# Patient Record
Sex: Female | Born: 1935 | Race: White | Hispanic: No | Smoking: Never smoker
Health system: Southern US, Community
[De-identification: ages and names within clinical notes are randomized; demographics above are authoritative.]

## PROBLEM LIST (undated history)

## (undated) DIAGNOSIS — C801 Malignant (primary) neoplasm, unspecified: Secondary | ICD-10-CM

## (undated) DIAGNOSIS — E538 Deficiency of other specified B group vitamins: Secondary | ICD-10-CM

## (undated) DIAGNOSIS — Z9181 History of falling: Secondary | ICD-10-CM

## (undated) DIAGNOSIS — N83201 Unspecified ovarian cyst, right side: Secondary | ICD-10-CM

## (undated) DIAGNOSIS — K219 Gastro-esophageal reflux disease without esophagitis: Secondary | ICD-10-CM

## (undated) DIAGNOSIS — R609 Edema, unspecified: Secondary | ICD-10-CM

## (undated) DIAGNOSIS — M810 Age-related osteoporosis without current pathological fracture: Principal | ICD-10-CM

## (undated) DIAGNOSIS — D61818 Other pancytopenia: Secondary | ICD-10-CM

## (undated) DIAGNOSIS — N83202 Unspecified ovarian cyst, left side: Secondary | ICD-10-CM

## (undated) DIAGNOSIS — M1990 Unspecified osteoarthritis, unspecified site: Secondary | ICD-10-CM

## (undated) DIAGNOSIS — E079 Disorder of thyroid, unspecified: Secondary | ICD-10-CM

## (undated) DIAGNOSIS — C50919 Malignant neoplasm of unspecified site of unspecified female breast: Principal | ICD-10-CM

## (undated) DIAGNOSIS — M549 Dorsalgia, unspecified: Secondary | ICD-10-CM

## (undated) DIAGNOSIS — I10 Essential (primary) hypertension: Secondary | ICD-10-CM

## (undated) HISTORY — DX: Deficiency of other specified B group vitamins: E53.8

## (undated) HISTORY — PX: TOTAL KNEE REVISION: SHX996

## (undated) HISTORY — PX: HERNIA REPAIR: SHX51

## (undated) HISTORY — DX: History of falling: Z91.81

## (undated) HISTORY — PX: BREAST SURGERY: SHX581

## (undated) HISTORY — DX: Unspecified ovarian cyst, right side: N83.201

## (undated) HISTORY — PX: TONSILLECTOMY: SUR1361

## (undated) HISTORY — DX: Unspecified ovarian cyst, left side: N83.202

## (undated) HISTORY — PX: CHOLECYSTECTOMY: SHX55

## (undated) HISTORY — DX: Age-related osteoporosis without current pathological fracture: M81.0

## (undated) HISTORY — DX: Malignant neoplasm of unspecified site of unspecified female breast: C50.919

## (undated) HISTORY — DX: Other pancytopenia: D61.818

## (undated) HISTORY — PX: APPENDECTOMY: SHX54

## (undated) HISTORY — PX: HIP FRACTURE SURGERY: SHX118

---

## 1998-01-11 ENCOUNTER — Other Ambulatory Visit: Admission: RE | Admit: 1998-01-11 | Discharge: 1998-01-11 | Payer: Self-pay | Admitting: Gastroenterology

## 2000-02-12 ENCOUNTER — Encounter: Admission: RE | Admit: 2000-02-12 | Discharge: 2000-02-12 | Payer: Self-pay | Admitting: Internal Medicine

## 2000-02-12 ENCOUNTER — Encounter: Payer: Self-pay | Admitting: Internal Medicine

## 2000-07-30 ENCOUNTER — Encounter (INDEPENDENT_AMBULATORY_CARE_PROVIDER_SITE_OTHER): Payer: Self-pay | Admitting: Specialist

## 2000-07-30 ENCOUNTER — Other Ambulatory Visit: Admission: RE | Admit: 2000-07-30 | Discharge: 2000-07-30 | Payer: Self-pay | Admitting: Gastroenterology

## 2000-09-01 ENCOUNTER — Other Ambulatory Visit: Admission: RE | Admit: 2000-09-01 | Discharge: 2000-09-01 | Payer: Self-pay | Admitting: Internal Medicine

## 2001-03-31 ENCOUNTER — Encounter: Payer: Self-pay | Admitting: Internal Medicine

## 2001-03-31 ENCOUNTER — Encounter: Admission: RE | Admit: 2001-03-31 | Discharge: 2001-03-31 | Payer: Self-pay | Admitting: Internal Medicine

## 2001-04-12 ENCOUNTER — Emergency Department (HOSPITAL_COMMUNITY): Admission: EM | Admit: 2001-04-12 | Discharge: 2001-04-12 | Payer: Self-pay | Admitting: Emergency Medicine

## 2001-04-12 ENCOUNTER — Encounter: Payer: Self-pay | Admitting: Emergency Medicine

## 2001-04-16 ENCOUNTER — Inpatient Hospital Stay (HOSPITAL_COMMUNITY): Admission: RE | Admit: 2001-04-16 | Discharge: 2001-04-18 | Payer: Self-pay | Admitting: Orthopaedic Surgery

## 2001-04-16 ENCOUNTER — Encounter: Payer: Self-pay | Admitting: Orthopaedic Surgery

## 2001-06-15 ENCOUNTER — Ambulatory Visit (HOSPITAL_COMMUNITY): Admission: RE | Admit: 2001-06-15 | Discharge: 2001-06-15 | Payer: Self-pay | Admitting: Orthopaedic Surgery

## 2001-06-15 ENCOUNTER — Encounter: Payer: Self-pay | Admitting: Orthopaedic Surgery

## 2001-09-01 ENCOUNTER — Other Ambulatory Visit: Admission: RE | Admit: 2001-09-01 | Discharge: 2001-09-01 | Payer: Self-pay | Admitting: Internal Medicine

## 2002-04-06 ENCOUNTER — Ambulatory Visit (HOSPITAL_COMMUNITY): Admission: RE | Admit: 2002-04-06 | Discharge: 2002-04-06 | Payer: Self-pay | Admitting: Ophthalmology

## 2002-04-26 ENCOUNTER — Encounter: Payer: Self-pay | Admitting: Internal Medicine

## 2002-04-26 ENCOUNTER — Encounter: Admission: RE | Admit: 2002-04-26 | Discharge: 2002-04-26 | Payer: Self-pay | Admitting: Internal Medicine

## 2003-02-28 ENCOUNTER — Other Ambulatory Visit: Admission: RE | Admit: 2003-02-28 | Discharge: 2003-02-28 | Payer: Self-pay | Admitting: Obstetrics & Gynecology

## 2003-06-07 ENCOUNTER — Encounter: Admission: RE | Admit: 2003-06-07 | Discharge: 2003-06-07 | Payer: Self-pay | Admitting: Internal Medicine

## 2003-06-16 ENCOUNTER — Emergency Department (HOSPITAL_COMMUNITY): Admission: EM | Admit: 2003-06-16 | Discharge: 2003-06-16 | Payer: Self-pay | Admitting: Emergency Medicine

## 2003-08-08 ENCOUNTER — Ambulatory Visit (HOSPITAL_COMMUNITY): Admission: RE | Admit: 2003-08-08 | Discharge: 2003-08-08 | Payer: Self-pay | Admitting: *Deleted

## 2003-11-02 ENCOUNTER — Ambulatory Visit (HOSPITAL_COMMUNITY): Admission: RE | Admit: 2003-11-02 | Discharge: 2003-11-02 | Payer: Self-pay | Admitting: Gastroenterology

## 2004-06-05 ENCOUNTER — Ambulatory Visit: Payer: Self-pay | Admitting: Internal Medicine

## 2004-06-11 ENCOUNTER — Ambulatory Visit (HOSPITAL_COMMUNITY): Admission: RE | Admit: 2004-06-11 | Discharge: 2004-06-11 | Payer: Self-pay | Admitting: Orthopaedic Surgery

## 2004-06-18 ENCOUNTER — Ambulatory Visit (HOSPITAL_COMMUNITY): Admission: RE | Admit: 2004-06-18 | Discharge: 2004-06-18 | Payer: Self-pay | Admitting: Orthopaedic Surgery

## 2004-07-10 ENCOUNTER — Encounter: Admission: RE | Admit: 2004-07-10 | Discharge: 2004-07-10 | Payer: Self-pay | Admitting: Internal Medicine

## 2004-07-13 ENCOUNTER — Ambulatory Visit (HOSPITAL_COMMUNITY): Admission: RE | Admit: 2004-07-13 | Discharge: 2004-07-13 | Payer: Self-pay | Admitting: Internal Medicine

## 2004-09-12 ENCOUNTER — Ambulatory Visit (HOSPITAL_COMMUNITY): Admission: RE | Admit: 2004-09-12 | Discharge: 2004-09-12 | Payer: Self-pay | Admitting: Internal Medicine

## 2004-11-05 ENCOUNTER — Ambulatory Visit: Payer: Self-pay | Admitting: Internal Medicine

## 2004-12-04 ENCOUNTER — Ambulatory Visit (HOSPITAL_COMMUNITY): Admission: RE | Admit: 2004-12-04 | Discharge: 2004-12-04 | Payer: Self-pay | Admitting: Obstetrics & Gynecology

## 2005-07-12 ENCOUNTER — Ambulatory Visit: Payer: Self-pay | Admitting: Internal Medicine

## 2005-08-09 ENCOUNTER — Ambulatory Visit (HOSPITAL_COMMUNITY): Admission: RE | Admit: 2005-08-09 | Discharge: 2005-08-09 | Payer: Self-pay | Admitting: Internal Medicine

## 2005-08-18 ENCOUNTER — Emergency Department (HOSPITAL_COMMUNITY): Admission: EM | Admit: 2005-08-18 | Discharge: 2005-08-19 | Payer: Self-pay | Admitting: Emergency Medicine

## 2005-08-27 ENCOUNTER — Ambulatory Visit (HOSPITAL_COMMUNITY): Admission: RE | Admit: 2005-08-27 | Discharge: 2005-08-27 | Payer: Self-pay | Admitting: Internal Medicine

## 2005-10-04 ENCOUNTER — Ambulatory Visit: Payer: Self-pay | Admitting: Gastroenterology

## 2005-10-07 ENCOUNTER — Ambulatory Visit (HOSPITAL_COMMUNITY): Admission: RE | Admit: 2005-10-07 | Discharge: 2005-10-07 | Payer: Self-pay | Admitting: Gastroenterology

## 2005-11-05 ENCOUNTER — Ambulatory Visit: Payer: Self-pay | Admitting: Gastroenterology

## 2005-11-11 ENCOUNTER — Ambulatory Visit: Payer: Self-pay | Admitting: Gastroenterology

## 2006-01-29 ENCOUNTER — Ambulatory Visit (HOSPITAL_COMMUNITY): Admission: RE | Admit: 2006-01-29 | Discharge: 2006-01-29 | Payer: Self-pay | Admitting: Internal Medicine

## 2006-03-18 ENCOUNTER — Ambulatory Visit: Payer: Self-pay | Admitting: Gastroenterology

## 2006-03-19 ENCOUNTER — Encounter (HOSPITAL_COMMUNITY): Admission: RE | Admit: 2006-03-19 | Discharge: 2006-04-18 | Payer: Self-pay | Admitting: Orthopaedic Surgery

## 2006-03-28 ENCOUNTER — Ambulatory Visit (HOSPITAL_COMMUNITY): Admission: RE | Admit: 2006-03-28 | Discharge: 2006-03-28 | Payer: Self-pay | Admitting: Family Medicine

## 2006-04-18 ENCOUNTER — Ambulatory Visit: Payer: Self-pay | Admitting: Gastroenterology

## 2006-04-18 LAB — CONVERTED CEMR LAB
Fecal Occult Blood: NEGATIVE
OCCULT 1: NEGATIVE
OCCULT 2: NEGATIVE
OCCULT 3: NEGATIVE
OCCULT 4: NEGATIVE
OCCULT 5: NEGATIVE

## 2006-05-23 ENCOUNTER — Ambulatory Visit (HOSPITAL_COMMUNITY): Admission: RE | Admit: 2006-05-23 | Discharge: 2006-05-23 | Payer: Self-pay | Admitting: Orthopaedic Surgery

## 2006-06-03 ENCOUNTER — Ambulatory Visit (HOSPITAL_COMMUNITY): Admission: RE | Admit: 2006-06-03 | Discharge: 2006-06-03 | Payer: Self-pay | Admitting: Orthopaedic Surgery

## 2006-06-03 ENCOUNTER — Encounter (INDEPENDENT_AMBULATORY_CARE_PROVIDER_SITE_OTHER): Payer: Self-pay | Admitting: *Deleted

## 2006-06-09 ENCOUNTER — Encounter (HOSPITAL_COMMUNITY): Admission: RE | Admit: 2006-06-09 | Discharge: 2006-07-09 | Payer: Self-pay | Admitting: Orthopaedic Surgery

## 2006-07-14 ENCOUNTER — Encounter (HOSPITAL_COMMUNITY): Admission: RE | Admit: 2006-07-14 | Discharge: 2006-08-13 | Payer: Self-pay | Admitting: Orthopaedic Surgery

## 2006-08-27 ENCOUNTER — Ambulatory Visit (HOSPITAL_COMMUNITY): Admission: RE | Admit: 2006-08-27 | Discharge: 2006-08-27 | Payer: Self-pay | Admitting: Internal Medicine

## 2006-09-29 ENCOUNTER — Ambulatory Visit (HOSPITAL_COMMUNITY): Admission: RE | Admit: 2006-09-29 | Discharge: 2006-09-29 | Payer: Self-pay | Admitting: Internal Medicine

## 2006-10-22 ENCOUNTER — Ambulatory Visit: Payer: Self-pay | Admitting: Internal Medicine

## 2006-10-22 ENCOUNTER — Ambulatory Visit: Payer: Self-pay | Admitting: Gastroenterology

## 2006-10-22 LAB — CONVERTED CEMR LAB
AST: 24 units/L (ref 0–37)
BUN: 14 mg/dL (ref 6–23)
Basophils Absolute: 0.1 10*3/uL (ref 0.0–0.1)
Basophils Relative: 0.7 % (ref 0.0–1.0)
Bilirubin, Direct: 0.3 mg/dL (ref 0.0–0.3)
CO2: 31 meq/L (ref 19–32)
Calcium: 9.8 mg/dL (ref 8.4–10.5)
Eosinophils Absolute: 0.2 10*3/uL (ref 0.0–0.6)
Eosinophils Relative: 2.1 % (ref 0.0–5.0)
GFR calc Af Amer: 63 mL/min
Glucose, Bld: 98 mg/dL (ref 70–99)
HCT: 37.4 % (ref 36.0–46.0)
Hemoglobin: 12.3 g/dL (ref 12.0–15.0)
Lymphocytes Relative: 26.9 % (ref 12.0–46.0)
MCV: 85.1 fL (ref 78.0–100.0)
Monocytes Absolute: 0.5 10*3/uL (ref 0.2–0.7)
Monocytes Relative: 6.5 % (ref 3.0–11.0)
Neutro Abs: 4.6 10*3/uL (ref 1.4–7.7)
Neutrophils Relative %: 63.8 % (ref 43.0–77.0)
Platelets: 141 10*3/uL — ABNORMAL LOW (ref 150–400)
RBC: 4.39 M/uL (ref 3.87–5.11)
Sodium: 141 meq/L (ref 135–145)
TSH: 0.33 microintl units/mL — ABNORMAL LOW (ref 0.35–5.50)
Total Protein: 6.2 g/dL (ref 6.0–8.3)

## 2006-11-03 ENCOUNTER — Ambulatory Visit: Payer: Self-pay | Admitting: Gastroenterology

## 2007-03-10 DIAGNOSIS — E039 Hypothyroidism, unspecified: Secondary | ICD-10-CM

## 2007-03-10 DIAGNOSIS — K219 Gastro-esophageal reflux disease without esophagitis: Secondary | ICD-10-CM

## 2007-03-10 DIAGNOSIS — I10 Essential (primary) hypertension: Secondary | ICD-10-CM | POA: Insufficient documentation

## 2007-07-06 ENCOUNTER — Other Ambulatory Visit: Admission: RE | Admit: 2007-07-06 | Discharge: 2007-07-06 | Payer: Self-pay | Admitting: Obstetrics and Gynecology

## 2007-09-22 ENCOUNTER — Ambulatory Visit (HOSPITAL_COMMUNITY): Admission: RE | Admit: 2007-09-22 | Discharge: 2007-09-22 | Payer: Self-pay | Admitting: Ophthalmology

## 2007-10-28 ENCOUNTER — Ambulatory Visit (HOSPITAL_COMMUNITY): Admission: RE | Admit: 2007-10-28 | Discharge: 2007-10-28 | Payer: Self-pay | Admitting: Internal Medicine

## 2008-04-12 ENCOUNTER — Ambulatory Visit (HOSPITAL_COMMUNITY): Admission: RE | Admit: 2008-04-12 | Discharge: 2008-04-12 | Payer: Self-pay | Admitting: Internal Medicine

## 2008-04-17 ENCOUNTER — Emergency Department (HOSPITAL_COMMUNITY): Admission: EM | Admit: 2008-04-17 | Discharge: 2008-04-17 | Payer: Self-pay | Admitting: Emergency Medicine

## 2008-04-18 ENCOUNTER — Ambulatory Visit (HOSPITAL_COMMUNITY): Admission: RE | Admit: 2008-04-18 | Discharge: 2008-04-18 | Payer: Self-pay | Admitting: Internal Medicine

## 2008-04-20 ENCOUNTER — Observation Stay (HOSPITAL_COMMUNITY): Admission: EM | Admit: 2008-04-20 | Discharge: 2008-04-26 | Payer: Self-pay | Admitting: Emergency Medicine

## 2008-04-26 ENCOUNTER — Inpatient Hospital Stay: Admission: AD | Admit: 2008-04-26 | Discharge: 2008-07-21 | Payer: Self-pay | Admitting: Internal Medicine

## 2008-05-28 ENCOUNTER — Ambulatory Visit (HOSPITAL_COMMUNITY): Admission: RE | Admit: 2008-05-28 | Discharge: 2008-05-28 | Payer: Self-pay | Admitting: Internal Medicine

## 2008-06-14 ENCOUNTER — Ambulatory Visit (HOSPITAL_COMMUNITY): Admission: RE | Admit: 2008-06-14 | Discharge: 2008-06-14 | Payer: Self-pay | Admitting: Orthopaedic Surgery

## 2008-07-01 ENCOUNTER — Ambulatory Visit (HOSPITAL_COMMUNITY): Admission: RE | Admit: 2008-07-01 | Discharge: 2008-07-01 | Payer: Self-pay | Admitting: Orthopaedic Surgery

## 2008-07-20 ENCOUNTER — Ambulatory Visit (HOSPITAL_COMMUNITY): Admission: RE | Admit: 2008-07-20 | Discharge: 2008-07-20 | Payer: Self-pay | Admitting: Internal Medicine

## 2008-09-09 ENCOUNTER — Other Ambulatory Visit: Admission: RE | Admit: 2008-09-09 | Discharge: 2008-09-09 | Payer: Self-pay | Admitting: Obstetrics & Gynecology

## 2009-03-01 ENCOUNTER — Ambulatory Visit (HOSPITAL_COMMUNITY): Admission: RE | Admit: 2009-03-01 | Discharge: 2009-03-01 | Payer: Self-pay | Admitting: Internal Medicine

## 2009-03-09 ENCOUNTER — Ambulatory Visit (HOSPITAL_COMMUNITY): Admission: RE | Admit: 2009-03-09 | Discharge: 2009-03-09 | Payer: Self-pay | Admitting: Internal Medicine

## 2009-03-16 ENCOUNTER — Ambulatory Visit (HOSPITAL_COMMUNITY): Admission: RE | Admit: 2009-03-16 | Discharge: 2009-03-16 | Payer: Self-pay | Admitting: Internal Medicine

## 2009-03-16 ENCOUNTER — Encounter (INDEPENDENT_AMBULATORY_CARE_PROVIDER_SITE_OTHER): Payer: Self-pay | Admitting: Radiology

## 2009-03-28 ENCOUNTER — Ambulatory Visit: Admission: RE | Admit: 2009-03-28 | Discharge: 2009-05-09 | Payer: Self-pay | Admitting: Radiation Oncology

## 2009-04-18 ENCOUNTER — Encounter: Admission: RE | Admit: 2009-04-18 | Discharge: 2009-04-18 | Payer: Self-pay | Admitting: General Surgery

## 2009-04-24 ENCOUNTER — Encounter: Admission: RE | Admit: 2009-04-24 | Discharge: 2009-04-24 | Payer: Self-pay | Admitting: General Surgery

## 2009-04-24 ENCOUNTER — Ambulatory Visit (HOSPITAL_BASED_OUTPATIENT_CLINIC_OR_DEPARTMENT_OTHER): Admission: RE | Admit: 2009-04-24 | Discharge: 2009-04-24 | Payer: Self-pay | Admitting: General Surgery

## 2009-07-12 ENCOUNTER — Ambulatory Visit (HOSPITAL_COMMUNITY): Payer: Self-pay | Admitting: Oncology

## 2009-10-11 ENCOUNTER — Encounter (HOSPITAL_COMMUNITY): Admission: RE | Admit: 2009-10-11 | Discharge: 2009-11-10 | Payer: Self-pay | Admitting: Oncology

## 2009-10-11 ENCOUNTER — Ambulatory Visit (HOSPITAL_COMMUNITY): Payer: Self-pay | Admitting: Oncology

## 2009-10-17 ENCOUNTER — Ambulatory Visit (HOSPITAL_COMMUNITY): Admission: RE | Admit: 2009-10-17 | Discharge: 2009-10-17 | Payer: Self-pay | Admitting: Oncology

## 2010-03-09 ENCOUNTER — Other Ambulatory Visit: Admission: RE | Admit: 2010-03-09 | Discharge: 2010-03-09 | Payer: Self-pay | Admitting: Obstetrics & Gynecology

## 2010-03-28 ENCOUNTER — Ambulatory Visit (HOSPITAL_COMMUNITY): Admission: RE | Admit: 2010-03-28 | Discharge: 2010-03-28 | Payer: Self-pay | Admitting: Oncology

## 2010-04-11 ENCOUNTER — Encounter (HOSPITAL_COMMUNITY): Admission: RE | Admit: 2010-04-11 | Payer: Self-pay | Admitting: Oncology

## 2010-06-17 ENCOUNTER — Encounter: Payer: Self-pay | Admitting: Internal Medicine

## 2010-07-03 ENCOUNTER — Ambulatory Visit (HOSPITAL_COMMUNITY)
Admission: RE | Admit: 2010-07-03 | Discharge: 2010-07-03 | Disposition: A | Discharge status: Home / Self Care | Payer: Medicare Other | Source: Ambulatory Visit | Attending: Family Medicine | Admitting: Family Medicine

## 2010-07-03 ENCOUNTER — Other Ambulatory Visit (HOSPITAL_COMMUNITY): Payer: Self-pay | Admitting: Family Medicine

## 2010-07-03 DIAGNOSIS — Z9889 Other specified postprocedural states: Secondary | ICD-10-CM

## 2010-07-03 DIAGNOSIS — M25569 Pain in unspecified knee: Secondary | ICD-10-CM | POA: Insufficient documentation

## 2010-07-03 DIAGNOSIS — R52 Pain, unspecified: Secondary | ICD-10-CM

## 2010-08-13 LAB — DIFFERENTIAL
Basophils Absolute: 0 10*3/uL (ref 0.0–0.1)
Eosinophils Absolute: 0.2 10*3/uL (ref 0.0–0.7)
Eosinophils Relative: 2 % (ref 0–5)
Lymphocytes Relative: 25 % (ref 12–46)
Lymphs Abs: 2.4 10*3/uL (ref 0.7–4.0)
Monocytes Absolute: 0.5 10*3/uL (ref 0.1–1.0)
Monocytes Relative: 5 % (ref 3–12)
Neutro Abs: 6.6 10*3/uL (ref 1.7–7.7)
Neutrophils Relative %: 69 % (ref 43–77)

## 2010-08-13 LAB — COMPREHENSIVE METABOLIC PANEL
ALT: 18 U/L (ref 0–35)
AST: 19 U/L (ref 0–37)
Alkaline Phosphatase: 79 U/L (ref 39–117)
BUN: 15 mg/dL (ref 6–23)
CO2: 30 mEq/L (ref 19–32)
Calcium: 10 mg/dL (ref 8.4–10.5)
Chloride: 103 mEq/L (ref 96–112)
Creatinine, Ser: 1.01 mg/dL (ref 0.4–1.2)
GFR calc Af Amer: >60 mL/min (ref >60)
GFR calc non Af Amer: 53 mL/min — ABNORMAL LOW (ref >60)
Total Bilirubin: 1 mg/dL (ref 0.3–1.2)
Total Protein: 6.8 g/dL (ref 6.0–8.3)

## 2010-08-13 LAB — CBC
Hemoglobin: 13.2 g/dL (ref 12.0–15.0)
Platelets: 148 10*3/uL — ABNORMAL LOW (ref 150–400)

## 2010-08-29 LAB — DIFFERENTIAL
Basophils Absolute: 0 10*3/uL (ref 0.0–0.1)
Basophils Relative: 0 % (ref 0–1)
Eosinophils Relative: 2 % (ref 0–5)
Lymphocytes Relative: 35 % (ref 12–46)
Neutro Abs: 3.6 10*3/uL (ref 1.7–7.7)

## 2010-08-29 LAB — URINALYSIS, ROUTINE W REFLEX MICROSCOPIC
Bilirubin Urine: NEGATIVE
Ketones, ur: NEGATIVE mg/dL
Nitrite: NEGATIVE
Protein, ur: NEGATIVE mg/dL
Specific Gravity, Urine: 1.012 (ref 1.005–1.030)
Urobilinogen, UA: 0.2 mg/dL (ref 0.0–1.0)

## 2010-08-29 LAB — COMPREHENSIVE METABOLIC PANEL
AST: 19 U/L (ref 0–37)
Alkaline Phosphatase: 90 U/L (ref 39–117)
BUN: 11 mg/dL (ref 6–23)
CO2: 31 mEq/L (ref 19–32)
Chloride: 106 mEq/L (ref 96–112)
Creatinine, Ser: 0.94 mg/dL (ref 0.4–1.2)
GFR calc non Af Amer: 58 mL/min — ABNORMAL LOW (ref >60)
Total Bilirubin: 1.2 mg/dL (ref 0.3–1.2)

## 2010-08-29 LAB — CBC
MCHC: 33.3 g/dL (ref 30.0–36.0)
Platelets: 130 10*3/uL — ABNORMAL LOW (ref 150–400)

## 2010-08-29 LAB — LACTATE DEHYDROGENASE: LDH: 228 U/L (ref 94–250)

## 2010-10-09 NOTE — Consult Note (Signed)
Ashley [name], Ashley [name]              ACCOUNT NO.:  [id number]   MEDICAL RECORD NO.:  [id number]          PATIENT TYPE:  ORB   LOCATION:  S150                          FACILITY:  [facility]   PHYSICIAN:  J. Darreld Mclean, M.D. DATE OF BIRTH:  04-12-1932   DATE OF CONSULTATION:  DATE OF DISCHARGE:                                 CONSULTATION   The patient seen for followup for fracture pelvis.  X-rays are good.  The fracture is healing nicely.  Neurovascular is intact.  I have  written an order to begin general physical therapy.  Weightbearing as  tolerated with a walker.  She will need repeat x-rays in the next 3  weeks.  I will see her back at that time.  If any difficulty contact me.           ______________________________  Shela Commons. Darreld Mclean, M.D.     JWK/MEDQ  D:  06/14/2008  T:  06/15/2008  Job:  [id number]

## 2010-10-09 NOTE — Group Therapy Note (Signed)
NAMEJAWANNA, Ashley [name]              ACCOUNT NO.:  [id number]   MEDICAL RECORD NO.:  [id number]          PATIENT TYPE:  INP   LOCATION:  A328                          FACILITY:  [facility]   PHYSICIAN:  Skeet Latch, DO    DATE OF BIRTH:  11-25-1931   DATE OF PROCEDURE:  04/23/2008  DATE OF DISCHARGE:                                 PROGRESS NOTE   SUBJECTIVE:  The patient is asleep on my examination. Apparently, the  patient has no changes in her condition and seems to be slowly  improving.   OBJECTIVE:  VITAL SIGNS:  Temperature 96.7, pulse 84, respiratory rate  20, blood pressure 146/80, sating 90% on room air.  CARDIOVASCULAR:  Regular rate and rhythm. No murmur, rub, or gallop.  LUNGS:  Clear to auscultation bilaterally. No wheezes, rales, or  rhonchi.  ABDOMEN:  Soft, nontender, and nondistended. Positive bowel sounds. No  rigidity or guarding.  EXTREMITIES:  No clubbing, cyanosis, or edema.  MUSCULOSKELETAL:  Back pain with major movement and to her legs.   LABORATORY DATA:  Urine culture positive for e-coli. Sodium 143,  potassium 3.6, chloride 104, CO2 32, glucose 107, BUN 10, creatinine  0.80. White count 7.7. Hemoglobin and hematocrit 10.9 and 33.8. Platelet  count 197,000.   ASSESSMENT/PLAN:  1. RADICULOPATHY:  Continue current management at this time. Again,      the patient will need neurosurgical consult as an outpatient.  2. ANEMIA:  Seems to be chronic in nature. Continue to monitor      closely.  3. URINARY TRACT INFECTION:  The patient is on IV antibiotics. Will      continue and switch over to p.o. prior to being discharged.  She wil be on DVT as well as GI prophylaxis.      Skeet Latch, DO  Electronically Signed     SM/MEDQ  D:  04/23/2008  T:  04/23/2008  Job:  [id number]

## 2010-10-09 NOTE — Consult Note (Signed)
NAMEALVERTA, CACCAMO              ACCOUNT NO.:  [id number]   MEDICAL RECORD NO.:  [id number]          PATIENT TYPE:  ORB   LOCATION:  S150                          FACILITY:  [facility]   PHYSICIAN:  J. Darreld Mclean, M.D. DATE OF BIRTH:  [birth year]   DATE OF CONSULTATION:  05/30/2008  DATE OF DISCHARGE:                                 CONSULTATION   The patient was seen in the nursing home.   The patient is well known to me.  She is a 77 year old, who fell over  the weekend and injured her left hip area.  She was seen, x-rays were  taken, and x-ray showed a fracture of the left pelvis and pubic ramus  area.  She has significant bruising around the left hip area in the left  lower leg.  Those x-rays were negative.  X-rays of hip were negative.  She has pain and tenderness to her hip.  So, she called and  notified me  yesterday, and I told her to keep her bedrest, so I could see her today.  Pain is controlled.  She does have the extensive bruising as stated.  Range of motion of the hip is tender, and she is doing fairly well lying  in bed.   I would like to begin physical therapy tomorrow, begin enoxaparin 40 mg  subcu daily for the next 2 weeks.  They can do bed-to-chair, chair-to-  bed type of activities as well beginning tomorrow.  She will need repeat  x-rays in approximately 2 weeks.            ______________________________  Shela Commons. Darreld Mclean, M.D.     JWK/MEDQ  D:  05/30/2008  T:  05/31/2008  Job:  [id number]

## 2010-10-09 NOTE — H&P (Signed)
Ashley [name], Ashley [name]              ACCOUNT NO.:  [id number]   MEDICAL RECORD NO.:  [id number]          PATIENT TYPE:  INP   LOCATION:  A328                          FACILITY:  [facility]   PHYSICIAN:  Skeet Latch, DO    DATE OF BIRTH:  1931/06/02   DATE OF ADMISSION:  04/20/2008  DATE OF DISCHARGE:  LH                              HISTORY & PHYSICAL   PRIMARY CARE PHYSICIAN:  Dr. Sherwood Gambler.   CHIEF COMPLAINT:  Back and leg pain.   HISTORY OF PRESENT ILLNESS:  This is a 77 year old Caucasian female who  presents with back and leg pain.  The patient states for the past few  weeks she has had worsening lower back pain with radiation down her  lower extremities.  The patient states that the pain is excruciating in  her right lower extremity and states that it is sharp and deep in  nature.  She states pains the pain is 10/10 at its worst.  There is no  relieving symptoms.  The patient states she has been seen by primary  care physician and has come to the emergency room a few times over the  last week and been placed on pain medication with no relief.  The  patient has been on muscle relaxant as well as narcotics with only  partial relief.  The patient states that primary care physician has  scheduled a neurosurgeon consultation, but the patient has not seen  neurosurgeon at this time.   PAST MEDICAL HISTORY:  1. Hypertension.  2. Gastroesophageal reflux disease.  3. Hiatal hernia.  4. Hypothyroidism.  5. Compression fracture.   SURGICAL HISTORY:  1. Appendectomy.  2. Cholecystectomy.  3. Orthopedic procedures.  4. As well as tonsillectomy.   SOCIAL HISTORY:  No history of alcohol, smoking or illicit drug use.   DRUG ALLERGIES:  PENICILLIN, LEVAQUIN, SULFA.   HOME MEDICATIONS:  1. Oxycodone 5/325 mg 1-2 tablets q.6 h. as needed.  2. Nabutone 750 mg daily.  3. Potassium 20 mEq daily.  4. Metoprolol ER 25 mg daily.  5. Cephalexin 500 mg 1 tablet 4 times a day.  6. Methocarbamol  500 mg 3 times a day for 7 days.  7. Synthroid 75 mcg daily.  8. Omeprazole 20 mg daily.  9. Alprazolam 0.5 mg 4 times a day.   REVIEW OF SYSTEMS:  HEENT:  Unremarkable.  MUSCULOSKELETAL:  Positive  for back and leg pain.  GI:  No abdominal pain, nausea, diarrhea.  CARDIOVASCULAR:  No chest pain, palpitations.  RESPIRATORY:  No  coughing, shortness of breath or wheezing.  Other systems are  unremarkable.   PHYSICAL EXAMINATION:  VITAL SIGNS:  Temperature 97.4, pulse 69,  respirations 18, blood pressure 122/58, saturating 98% on room air.  CONSTITUTIONAL:  Well developed, well hydrated, in no acute distress.  HEENT:  Head is atraumatic, normocephalic.  PERRL.  EOMI.  NECK:  Soft, supple, nontender, nondistended.  All mucosa is moist.  CARDIOVASCULAR:  Regular rate and rhythm.  No murmurs, rubs, or gallops.  LUNGS:  Clear to auscultation bilaterally.  No rales, rhonchi or  wheezing.  ABDOMEN:  Soft, nontender, nondistended.  No organosplenomegaly.  No  rigidity or guarding.  Positive bowel sounds.  EXTREMITIES:  No clubbing or cyanosis.  Does have trace to +1 edema  bilateral lower extremities.  SKIN:  Noted, she has stage II ulcer in her sacral area.  Minimal  drainage.   LABORATORY DATA:  Urinalysis:  Small amount of bilirubin, no nitrites or  leukocytes, no glucose.  Sodium 141, potassium 4.3, chloride 105, CO2  30, glucose 102, BUN 23, creatinine 0.95, calcium 10.3.  White count is  9.9, hemoglobin 12, hematocrit 36.8, platelet count 219.   Prior CT of her spine done on April 18, 2008, showed:  1. Schmorl's nodes in T12, L1, L2 vertebral bodies.  2. Mild disk bulge L2/L3 without obvious compressive pathology.  3. Mild eccentric disk bulge L3/L4 resulting in left-sided foraminal      encroachment.  4. Multifactorial spinal stenosis, L5-S1, bilateral neural foraminal      encroachment at both levels.   ASSESSMENT:  This is a 77 year old Caucasian female who presents with   lower back pain with radiation to her bilateral lower extremities.  The  patient has been seen by primary care physician and has a scheduled  appointment with neurosurgery but presents with intractable back pain  with radiation to her lower extremities.   PLAN:  1. For her intractable back pain with radiculopathy type symptoms,      will put the patient on IV pain medication.  Will add fentanyl      patch for pain control and try to wean her IV pain medications.      Anticipate the patient being discharged in the next 24-48 hours if      her symptoms improved on fentanyl patch to minimize her IV pain      medication.  The patient will need a consultation with neurosurgery      as an outpatient.  2. For her history of hypertension and GERD, the patient will placed      on home medications.  3. For hyperthyroidism, the patient will also be placed on home      medications.  The patient will be on DVT as well as GI prophylaxis      at this time.      Skeet Latch, DO  Electronically Signed     SM/MEDQ  D:  04/20/2008  T:  04/20/2008  Job:  [id number]   cc:   Madelin Rear. Sherwood Gambler, MD  Fax: [phone]

## 2010-10-09 NOTE — Discharge Summary (Signed)
Ashley [name], Ashley [name]              ACCOUNT NO.:  [id number]   MEDICAL RECORD NO.:  [id number]          PATIENT TYPE:  OBV   LOCATION:  A328                          FACILITY:  [facility]   PHYSICIAN:  Skeet Latch, DO    DATE OF BIRTH:  [birth year]   DATE OF ADMISSION:  04/20/2008  DATE OF DISCHARGE:  12/01/2009LH                               DISCHARGE SUMMARY   PRIMARY CARE PHYSICIAN:  Madelin Rear. Sherwood Gambler, MD   DISCHARGE DIAGNOSES:  1. Radiculopathy.  2. Anemia.  3. Urinary tract infection.  4. History of hypertension.  5. History of hypothyroidism.  6. History of compression fractures.  7. History of GERD.   BRIEF HOSPITAL COURSE:  This 77 year old Caucasian female presented to  the ER with back and leg pain.  The patient states that over the last  few weeks she has had worsening lower back pain with radiation down her  lower extremities.  The patient states the pain was excruciating,  especially in the right lower extremity.  States it was sharp and deep  in nature.  The patient states the pain was 10/10 at its worse with no  relieving factors.  The patient states she went to her primary care  physician's office as well as the emergency room a few times in the last  week and placed on medications without relief.  The patient was last  placed on muscle relaxants as well as narcotics with partial relief.  The patient states that there was talk of her being scheduled to see a  neurosurgeon in the near future.  The patient was admitted with  intractable back pain with radiculopathy.  She was placed on IV pain  medications and a Fentanyl patch was added for pain control.  The  patient's workup showed that she had a urinary tract infection.  She was  placed on IV antibiotics.  For CT of her spine, her last CT of her spine  showed:  1. __________in T12, L1-L2 vertebral body.  2. Mild disk bulge L2-L3 without obvious compressing pathology.  3. Mild eccentric disk bulge L3-L4 resulting  in left sided foraminal      encroachment.  4. Multifactorial spinal stenosis L5-S1, bilateral neuroforamen      encroachment both levels.   The patient's pain seems to have been improving.  The patient has had  some hysterical-type outbursts during the hospital stay.  She has been  receiving physical therapy, and they recommend the patient will need to  be at a skilled nursing facility at this time.   DISCHARGE MEDICATIONS:  1. Oxycodone/APAP 5/325 mg 22 tablets every 6 hours as needed.  2. Nabumetone 50 mg daily.  3. Potassium 20 mEq daily.  4. Metoprolol ER 25 mg daily.  5. Methocarbamol 500 mg as previously directed.  6. Synthroid 75 mcg daily.  7. Omeprazole 20 mg daily.  8. Alprazolam 0.5 mg q.i.d.  9. Duragesic patch 25 mcg per hour every 72 hours.   DISCHARGE VITAL SIGNS:  Temperature 98.1, respirations 20, heart rate  78, blood pressure 120/78.   CONDITION ON DISCHARGE:  Stable.  DISPOSITION:  The patient will be discharged on [facility] in  stable condition.   DISCHARGE INSTRUCTIONS:  1. The patient is to follow with Dr. Sherwood Gambler in the next 3-5 days.  2. The patient to maintain low-salt, heart-healthy diet.  3. Increase activity slowly to previous activities.  4. Take medications as directed.  5. I have called Dr. Sharyon Medicus office, left message regarding patient      will probably need a consultation with neurosurgery regarding if      there are any treatments they recommend at this time.      Skeet Latch, DO  Electronically Signed     SM/MEDQ  D:  04/26/2008  T:  04/26/2008  Job:  [id number]   cc:   Madelin Rear. Sherwood Gambler, MD  Fax: [phone]

## 2010-10-12 NOTE — Assessment & Plan Note (Signed)
[name]                           GASTROENTEROLOGY OFFICE NOTE   NAME:[name], Ashley [name]                     MRN:          [id number]  DATE:03/18/2006                            DOB:          [birth year]    Ashley Ashley [name] has had a mild iron deficiency and has been on Repliva.  Lab data I  have for review from September 28 showed a hemoglobin of 12.3; white cell  count 5.5; and platelet count of 144,000.  Iron saturation was 13%.  Helicobacter antibody was negative.   Ashley Ashley [name] to have problems with intermittent chest pain associated  with a large paraesophageal hernia and Ashley Ashley [name] has had formal surgical  consultation by Dr. Derrell Lolling this past May.  It was his recommendation that  Ashley Ashley [name] have her paraesophageal hernia repaired surgically and I concurred.  However, the patient did not proceed along these lines.  Surgical  consultation was performed on Oct 18, 2005.   Ashley Ashley [name] takes Prilosec 20 mg a day, Ashley [name] to have reflux symptoms and  intermittent nausea/vomiting.  Ashley Ashley [name] is on a variety of other medications  listed and reviewed in her chart.  Ashley Ashley [name] denies dysphagia at this time or any  hepatobiliary complaints.   Ashley Ashley [name] weighs 200 pounds, which is normal weight, and blood pressure is 110/70.  General physical exam is not performed at this time.   ASSESSMENT AND RECOMMENDATIONS:  I have changed Ashley Ashley [name] from Prilosec to  Nexium 40 mg a day for her known acid reflux problems and large hiatal  hernia with a paraesophageal component.  Ashley Ashley [name] assuredly is probably losing  iron slowing from Ashley Ashley [name] erosions in her hiatal hernia.  Had a negative  colonoscopy some 4 years ago.  Ashley Ashley [name] denies melena or hematochezia and I have  no documentation that Ashley Ashley [name] has had guaiac-positive stools.   RECOMMENDATIONS:  1. Outpatient hemoccult cards.  2. Continue iron therapy.  3. Change from Prilosec to Nexium 40 mg thirty minutes before the first      meal of the day and twice a day if  needed.  4. P.r.n. GI followup as needed.   As per my previous thoughts, this patient probably will need surgical  intervention that will be dictated by her future course.  Actually, Ashley Ashley [name] has  had a fairly good 5 months since Ashley Ashley [name] was last  seen.  Ashley Ashley [name] has had previous extensive cardiac testing that was unremarkable.  This was all performed because of atypical chest pain.       Ashley Rea. Jarold Motto, MD, [place], [place]      DRP/MedQ  DD:  03/18/2006  DT:  03/19/2006  Job #:  [id number]   cc:   Madelin Rear. Sherwood Gambler, MD  Angelia Mould. Derrell Lolling, M.D.

## 2010-10-12 NOTE — Consult Note (Signed)
[facility]  Patient:    Ashley [name], Ashley [name] Visit Number: [id number] MRN: [id number]          Service Type: Attending:  Darreld Mclean, M.D. Dictated by:   Darreld Mclean, M.D. Proc. Date: 04/12/01 Adm. Date:  04/12/01                            Consultation Report  REQUESTING PHYSICIAN:  Dr. Rosalia Hammers  BRIEF HISTORY:  The patient fell today on wet ground.  Her right wrist showed a displaced fracture right distal radius.  No other injuries.  The patient is well known to me as I have operated on her ______ leg before.  I reviewed the ER record from Dr. Rosalia Hammers and the nurses notes herein incorporated by reference.  The patient has a deformity of the right distal radius, no other injury. After appropriate prep, 1% plain Xylocaine block was given.  Closed reduction carried out.  Sugar tong splint applied.  IMPRESSION:  Fractured distal radius, displaced right.  MEDICATIONS:  Tylox for pain given.  Patient information booklet given.  I will see her in the office tomorrow. A sling has been provided.  Keep the cast dry.  Return if any problems. Dictated by:   Darreld Mclean, M.D. Attending:  Darreld Mclean, M.D. DD:  04/12/01 TD:  04/12/01 Job: [id number]

## 2010-10-12 NOTE — Procedures (Signed)
Ashley [name], Ashley [name]                          ACCOUNT NO.:  [id number]   MEDICAL RECORD NO.:  [id number]                  PATIENT TYPE:  PREC   LOCATION:                                       FACILITY:   PHYSICIAN:  Vida Roller, M.D.                DATE OF BIRTH:   DATE OF PROCEDURE:  08/08/2003  DATE OF DISCHARGE:                                    STRESS TEST   PROCEDURE:  Adenosine Cardiolite.   INDICATION:  [name] is a 77 year old female with a history of coronary  artery disease, atypical chest discomfort.   CARDIAC RISK FACTORS:  Age and unknown lipids.   She did have a Cardiolite in 2001 at [facility] and vascular with an  ejection fraction of 70%, and mild anteroseptal/ apical ischemia noted.   BASELINE DATA:  EKG revealed sinus bradycardia at 58 beats per minute,  nonspecific ST abnormalities.  Blood pressure 142/72.   DESCRIPTION OF PROCEDURE:  Adenosine 51 mg was infused over four minute  protocol with Cardiolite injected at three minutes.  The patient reported  chest and abdominal fullness along with nausea which resolved in recovery.  EKG revealed a few PVC's and no ischemic changes.   FINAL IMAGES:  Final images and results are pending M.D. review.     ________________________________________  ___________________________________________  Jae Dire, P.A. [facility]                      Vida Roller, M.D.   AB/MEDQ  D:  08/08/2003  T:  08/09/2003  Job:  [id number]

## 2010-10-12 NOTE — Op Note (Signed)
Ashley [name], Ashley [name]              ACCOUNT NO.:  [id number]   MEDICAL RECORD NO.:  [id number]          PATIENT TYPE:  AMB   LOCATION:  DAY                           FACILITY:  [facility]   PHYSICIAN:  J. Darreld Mclean, M.D. DATE OF BIRTH:  02-Jun-1931   DATE OF PROCEDURE:  06/03/2006  DATE OF DISCHARGE:                               OPERATIVE REPORT   PREOPERATIVE DIAGNOSIS:  Tear of medial meniscus, left knee.  Degenerative joint disease of the left knee.   POSTOPERATIVE DIAGNOSIS:  Tear of medial meniscus, left knee.  Degenerative joint disease of the left knee.   OPERATION PERFORMED:  Operative arthroscopy of the left knee.  Partial  medial meniscectomy using the holmium laser.   SURGEON:  J. Darreld Mclean, M.D.   ANESTHESIA:  Spinal.   TOURNIQUET TIME:  28 minutes.   DRAINS:  None.   INDICATIONS FOR PROCEDURE:  The patient is a 77 year old female with  pain and tenderness of the knee, locking, giving way. MRI shows tear of  the posterior horn, medial meniscus, complex type tear, degenerative  joint disease.  Surgery is recommended.  Risks and imponderables of the  procedure were discussed preoperatively.  The patient appeared to agree  and understands the procedure.  She asked appropriate questions.   DESCRIPTION OF PROCEDURE:  The patient was seen in the holding area and  identified the left knee as the correct surgical site.  I placed a mark  on the left knee, she placed a mark on the left knee.  She was brought  back to the operating room.  She was given spinal anesthesia and placed  supine on the operating room table.  Tourniquet placed deflated, left  upper thigh and leg holder placed deflated on the left upper thigh.  She  was prepped and draped in the usual manner.  Time out to identify Ms.  [name] as the patient and the left knee arthroscopy as the procedure.  The leg was elevated and wrapped circumferentially with the Esmarch  bandage.  Esmarch bandage removed.   Tourniquet was inflated to 300 mmHg.  Inflow was inserted medially.  Lactated Ringers instilled in the knee by  an infusion pump.  Knee was systematically examined.   FINDINGS:  The suprapatellar pouch had some degenerative disk disease  and there was eburnation of bone around the patella and the femoral  notch.  Medially there were grade 3 to 4 changes with tear at the  posterior horn of the medial meniscus, degenerative joint disease was  present and small little fragments of meniscus floating.  Anterior  cruciate was intact.  She is slightly lax on the medial collateral  ligament.  Laterally, she has mild degenerative changes grade 2 to 3  with some fraying of the meniscus but no tear.   Attention directed back to the medial side and using a meniscal punch,  meniscal shaver and a holmium laser, a good smooth contour was obtained  after removing the meniscus on the posterior horn that was torn.  Permanent pictures were taken throughout the case.  Knee was  systematically re-examined, no  new pathology found.  Wounds  reapproximated with 3-0 nylon in interrupted vertical mattress manner.  Marcaine 0.25% was instilled in each portal.  Tourniquet was deflated  after 28 minutes.  Sterile dressing applied.  Bulky dressing applied.  Knee immobilizer applied.  Patient given appropriate analgesia for pain.  I will see her in the office in approximately 10 days to two weeks.  Physical therapy has been arranged.  If she has any difficulty she is to  contact me through the office hospital beeper system.           ______________________________  Shela Commons. Darreld Mclean, M.D.     JWK/MEDQ  D:  06/03/2006  T:  06/03/2006  Job:  [id number]

## 2010-10-12 NOTE — H&P (Signed)
[facility]  Patient:    Ashley [name], Ashley [name] Visit Number: [id number] MRN: [id number]          Service Type: SUR Location: 3A A340 01 Attending Physician:  Windle Guard Dictated by:   Darreld Mclean, M.D. Admit Date:  04/16/2001 Discharge Date: 04/18/2001                           History and Physical  CHIEF COMPLAINT:  "I fell and hurt my wrist."  HISTORY OF PRESENT ILLNESS:  Patient was seen by me over the weekend.  She had fallen and fractured her right distal radius to the right dominant hand. Closed reduction was carried out but shows a significant dorsal tilt and she is severely osteoporotic.  X-rays in the office today confirm the dorsal tilt and I have recommended surgery.  She had a previous fracture of a very similar type in 1998 requiring surgery on the left nondominant wrist and has done well with it.  She is familiar with the procedure.  PREVIOUS PROCEDURES:  Patient has been treated by me in the past for a fracture to the left foot in 1995, fracture of right radial head in 1997, fracture to distal radius on the left which required surgery in 1998, fracture of the right patella in October 1998 which required surgery, and she has also sustained a fracture of the thoracic vertebra in 2000.  Patient has history of osteoporosis.  ALLERGIES:  Per patient, is allergic to MAXAQUIN, SULFA, PENICILLIN, FOSAMAX, EVISTA, VIOXX.  MEDICATIONS:  She is currently taking Vicodin and Relafen.  She also takes: 1. Toprol XL 20 mg once a day. 2. Synthroid 88 mcg a day. 3. Xanax 5 one half as needed. 4. Prilosec 20 mg a day. 5. K-Dur 20 mEq a day. 6. Lasix 20 mg a day.  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  PAST MEDICAL HISTORY:  Negative.  PHYSICAL EXAMINATION:  VITAL SIGNS:  Within normal limits today.  GENERAL:  Patient is alert, cooperative, and oriented.  HEENT:   Negative.  NECK:  Supple.  LUNGS:  Clear to P&A.  HEART:  Regular without murmur.  ABDOMEN:  Soft, nontender, without masses.  EXTREMITIES:  Right arm is in a sugar tong splint, well-healed scar in left wrist and left knee.  CNS:  Intact.  SKIN:  Intact.  IMPRESSION: 1. Fracture of distal radius on the right. 2. Osteoporosis.  PLAN:  I discussed with patient the planned procedure.  Has undergone a similar procedure on the left four years ago and did well.  Labs are pending. Dictated by:   Darreld Mclean, M.D. Attending Physician:  Windle Guard DD:  04/13/01 TD:  04/13/01 Job: [id number]

## 2010-10-12 NOTE — Discharge Summary (Signed)
[facility]  Patient:    Ashley [name], Ashley [name] Visit Number: [id number] MRN: [id number]          Service Type: SUR Location: 3A A340 01 Attending Physician:  Windle Guard Dictated by:   Darreld Mclean, M.D. Admit Date:  04/16/2001 Discharge Date: 04/18/2001                             Discharge Summary  DISCHARGE DIAGNOSES:  Comminuted fracture of right distal radius.  DISCHARGE STATUS:  Improved.  DISPOSITION:  Home.  Seen in my office on Monday, December 2.  HISTORY:  The patient fell while training and injured her right wrist.  Seen in the emergency room on the weekend before her admission here.  She had a comminuted fracture.  Tried reducing it and it still was not satisfactory. Was seen in the office a couple days later.  Recommended surgery.  Had previous surgery that was similar on the left wrist.  She underwent surgery on the 21st.  She tolerated it well.  On the evening after surgery she was doing well with complaints of pain and tenderness.  The following day she had significant edema in her hand and ecchymosis.  I recommended continued stay in the hospital with admission, pain control, elevation.  By the 23rd she was doing much better.  Motion was better.  Pain was less.  She was discharged home to be seen on December 2 as stated.  She was given Vicodin ES for pain, told to elevate and keep dry.  Any difficulty can call me through the hospital beeper system.  X-rays showed the anatomical reduction of the fracture.  Her laboratories were within normal limits. Dictated by:   Darreld Mclean, M.D. Attending Physician:  Windle Guard DD:  04/28/01 TD:  04/28/01 Job: [id number]

## 2010-10-12 NOTE — H&P (Signed)
NAMEBRENT, Ashley [name]              ACCOUNT NO.:  [id number]   MEDICAL RECORD NO.:  [id number]          PATIENT TYPE:  AMB   LOCATION:  DAY                           FACILITY:  [facility]   PHYSICIAN:  J. Darreld Mclean, M.D. DATE OF BIRTH:  [birth year]   DATE OF ADMISSION:  06/02/2006  DATE OF DISCHARGE:  LH                              HISTORY & PHYSICAL   CHIEF COMPLAINT:  Her knee hurts on the left.   HISTORY OF PRESENT ILLNESS:  The patient has had pain and tenderness in  her left knee, on and off now for several years, getting progressively  worse.  It particularly got bad around Halloween of 2007, then got  better, then got much worse right before Christmas.  I was concerned  that she had a meniscus tear.  She did not want to have an MRI.  She  finally consented to having an MRI done right after Christmas.  The MRI  showed a complex tear of the left medial meniscus as well as  degenerative joint disease of the knee.  I recommended surgery. The  patient wanted to delay surgery until she talked to her family and got  things set up with her work.  Surgery is now scheduled for an  arthroscopy of the left knee. I discussed with the patient, the planned  procedure, risks and imponderables of the procedures, and she  understands.  She asked appropriate questions.   PAST MEDICAL HISTORY:  Past history is positive for hypertension and  stomach ulcer and Barrett's disease.   ALLERGIES:  LEVAQUIN.  PENICILLIN.  SULFA.  MAXAQUIN.   MEDICATIONS:  1. Toprol XL 25 mg daily.  2. K-Dur 10 mEq daily.  3. Synthroid 0.8 mg daily.  4. Prilosec 20 mg daily.  5. Relafen 750 mg one daily.  6. Nitroglycerin as needed.  7. Vicodin as needed.   HABITS:  Does not smoke.  Denies alcoholic beverages.   FAMILY DOCTOR:  Dr. Sherwood Gambler.   PAST SURGICAL HISTORY:  Status-post tonsillectomy and appendectomy as a  child.  Cholecystectomy.  Two hernia repairs in the 50s.  Left wrist and  right wrist surgery by me  and knee surgery by me, a fluoroscopy and  right elbow surgery by me.   FAMILY HISTORY:  Brother is a diabetic.   SOCIAL HISTORY:  Patient works at the hospital.  She is divorced. She is  currently 77 years old.   PHYSICAL EXAMINATION:  VITAL SIGNS:  Within normal limits.  GENERAL:  Alert and oriented.  HEENT:  Negative.  NECK:  Supple.  LUNGS:  Clear to auscultation and percussion.  HEART:  Regular without murmur heard.  ABDOMEN:  Soft.  Nontender.  BREASTS:  Without masses.  EXTREMITIES:  Left knee is painful and tender, with a limp. She wears a  brace. She has effusion of the knee, pain and tenderness medially, and  there is some crepitus.  Neurovascular is intact.  CENTRAL NERVOUS SYSTEM:  Negative.  SKIN:  Intact.   IMPRESSION:  1. Tear of medial meniscus, left knee.  2. Degenerative joint disease.  3.  Multiple arthralgias.  4. Hypertension.   Discussed with patient, the planned procedure, potential risks and  problems of the procedure.  She wants to proceed with left knee  arthroscopy.  Labs are pending.                                            ______________________________  J. Darreld Mclean, M.D.     JWK/MEDQ  D:  06/02/2006  T:  06/02/2006  Job:  [id number]

## 2010-10-12 NOTE — Op Note (Signed)
[facility]  Patient:    Ashley [name], Ashley [name] Visit Number: [id number] MRN: [id number]          Service Type: SUR Location: 3A A340 01 Attending Physician:  Windle Guard Dictated by:   Darreld Mclean, M.D. Proc. Date: 04/16/01 Admit Date:  04/16/2001                             Operative Report  PREOPERATIVE DIAGNOSIS:  Fracture of distal radius on the right.  POSTOPERATIVE DIAGNOSIS:  PROCEDURE:  Open treatment and internal fixation of right distal radius fracture with a Synthes plate and screws; carpal tunnel release, right.  ANESTHESIA:  General.  DRAINS:  No drains.  TOURNIQUET TIME:  Thirty-seven minutes.  SURGEON:  Darreld Mclean, M.D.  ASSISTANT:  Candace Cruise, P.A.-C.  DISPOSITION:  To recovery in good condition.  Sugar-tong splint applied at the end of the procedure.  INDICATIONS:  Patient is a 77 year old white female who fell over the weekend during the rain and injured her right wrist.  She had a fracture of distal radius, displaced.  Reduction was carried out in the emergency room but it was unstable and her dorsal portion of the bone was fractured and it would not stay in good position.  I saw her back in the office on Tuesday and told her that the fracture was unstable and it would slip more and recommended surgery. She had a similar fracture on the other side, on the left side, in 1998 and did well.  She understands the procedure, understands what we needed to do. Risks and imponderables were discussed with the patient preoperatively; she appeared to understand and agreed with the procedure as outlined.  DESCRIPTION OF PROCEDURE:  Patient was placed supine on the operating room table with the hand table attached, tourniquet placed deflated, right upper arm.  After general anesthesia was given, the arm was prepped and draped, then the arm was elevated and wrapped circumferentially with an Esmarch bandage, tourniquet inflated to  250 mmHg, Esmarch bandage removed.  A line for incision was made and with careful dissection, the median nerve was identified proximally at the wrist level.  Carpal tunnel release was then carried out and the median nerve was exposed with no apparent injury.  There was ecchymosis on the proximal end from the fracture.  The median nerve was then retracted ulnarward and with careful dissection, the pronator quadratus was exposed. Radial shaft was then exposed and the fracture was exposed.  Fracture was reduced anatomically.  Using the C-arm fluoroscopy unit to aid, we showed that the fracture was anatomically reduced.  Then I selected a so-called T plate, three-hole distally, four- or five-hole proximally.  We placed a central screw in in a long slot and the screw was placed.  The plate was moved until I could get the best placement both distally and proximally.  Distally, the center screw hole was used and drilled and tapped and then the other screws were then drilled and tapped.  X-rays shows anatomic reduction of the fracture with good position and placement of the screw.  The most proximal screw hole was not used.  Permanent pictures were taken and saved, then the pronator quadratus was reapproximated and then the subcu was reapproximated using 2-0 chromic and then the skin reapproximated with skin staples.  Tourniquet was deflated at 37 minutes, sterile dressing and bulky dressing applied and sugar-tong splint applied.  Patient tolerated procedure  well and went to recovery in good condition.  Patient is admitted. Dictated by:   Darreld Mclean, M.D. Attending Physician:  Windle Guard DD:  04/16/01 TD:  04/16/01 Job: [id number]

## 2010-11-27 ENCOUNTER — Emergency Department (HOSPITAL_COMMUNITY): Payer: Medicare Other

## 2010-11-27 ENCOUNTER — Emergency Department (HOSPITAL_COMMUNITY)
Admission: EM | Admit: 2010-11-27 | Discharge: 2010-11-27 | Disposition: A | Discharge status: Home / Self Care | Payer: Medicare Other | Attending: Emergency Medicine | Admitting: Emergency Medicine

## 2010-11-27 DIAGNOSIS — R1011 Right upper quadrant pain: Secondary | ICD-10-CM | POA: Insufficient documentation

## 2010-11-27 DIAGNOSIS — M8448XA Pathological fracture, other site, initial encounter for fracture: Secondary | ICD-10-CM | POA: Insufficient documentation

## 2010-11-27 DIAGNOSIS — K219 Gastro-esophageal reflux disease without esophagitis: Secondary | ICD-10-CM | POA: Insufficient documentation

## 2010-11-27 DIAGNOSIS — I10 Essential (primary) hypertension: Secondary | ICD-10-CM | POA: Insufficient documentation

## 2010-11-27 DIAGNOSIS — E0590 Thyrotoxicosis, unspecified without thyrotoxic crisis or storm: Secondary | ICD-10-CM | POA: Insufficient documentation

## 2010-11-27 DIAGNOSIS — M549 Dorsalgia, unspecified: Secondary | ICD-10-CM | POA: Insufficient documentation

## 2010-11-27 LAB — URINALYSIS, ROUTINE W REFLEX MICROSCOPIC
Bilirubin Urine: NEGATIVE
Glucose, UA: NEGATIVE mg/dL
Ketones, ur: 15 mg/dL — AB
Nitrite: NEGATIVE
Specific Gravity, Urine: 1.017 (ref 1.005–1.030)
pH: 7.5 (ref 5.0–8.0)

## 2010-11-27 LAB — DIFFERENTIAL
Basophils Absolute: 0 10*3/uL (ref 0.0–0.1)
Basophils Relative: 0 % (ref 0–1)
Eosinophils Absolute: 0.1 10*3/uL (ref 0.0–0.7)
Eosinophils Relative: 1 % (ref 0–5)
Monocytes Absolute: 0.6 10*3/uL (ref 0.1–1.0)
Monocytes Relative: 6 % (ref 3–12)
Neutro Abs: 7.4 10*3/uL (ref 1.7–7.7)

## 2010-11-27 LAB — COMPREHENSIVE METABOLIC PANEL
ALT: 15 U/L (ref 0–35)
Albumin: 3.6 g/dL (ref 3.5–5.2)
Alkaline Phosphatase: 189 U/L — ABNORMAL HIGH (ref 39–117)
Calcium: 10.4 mg/dL (ref 8.4–10.5)
Potassium: 4.3 mEq/L (ref 3.5–5.1)
Sodium: 140 mEq/L (ref 135–145)
Total Protein: 6.9 g/dL (ref 6.0–8.3)

## 2010-11-27 LAB — CBC
MCH: 28.2 pg (ref 26.0–34.0)
MCHC: 32.3 g/dL (ref 30.0–36.0)
Platelets: 158 10*3/uL (ref 150–400)
RDW: 15.1 % (ref 11.5–15.5)

## 2010-11-27 LAB — URINE MICROSCOPIC-ADD ON

## 2010-12-01 ENCOUNTER — Encounter: Payer: Self-pay | Admitting: Emergency Medicine

## 2010-12-01 ENCOUNTER — Emergency Department (HOSPITAL_COMMUNITY): Payer: Medicare Other

## 2010-12-01 ENCOUNTER — Emergency Department (HOSPITAL_COMMUNITY)
Admission: EM | Admit: 2010-12-01 | Discharge: 2010-12-01 | Disposition: A | Discharge status: Home / Self Care | Payer: Medicare Other | Attending: Emergency Medicine | Admitting: Emergency Medicine

## 2010-12-01 DIAGNOSIS — N390 Urinary tract infection, site not specified: Secondary | ICD-10-CM | POA: Insufficient documentation

## 2010-12-01 DIAGNOSIS — M549 Dorsalgia, unspecified: Secondary | ICD-10-CM

## 2010-12-01 DIAGNOSIS — M545 Low back pain, unspecified: Secondary | ICD-10-CM | POA: Insufficient documentation

## 2010-12-01 HISTORY — DX: Edema, unspecified: R60.9

## 2010-12-01 HISTORY — DX: Essential (primary) hypertension: I10

## 2010-12-01 HISTORY — DX: Unspecified osteoarthritis, unspecified site: M19.90

## 2010-12-01 HISTORY — DX: Gastro-esophageal reflux disease without esophagitis: K21.9

## 2010-12-01 HISTORY — DX: Dorsalgia, unspecified: M54.9

## 2010-12-01 HISTORY — DX: Disorder of thyroid, unspecified: E07.9

## 2010-12-01 HISTORY — DX: Malignant (primary) neoplasm, unspecified: C80.1

## 2010-12-01 LAB — URINALYSIS, ROUTINE W REFLEX MICROSCOPIC
Ketones, ur: 40 mg/dL — AB
Leukocytes, UA: NEGATIVE
Nitrite: NEGATIVE
Specific Gravity, Urine: >1.03 — ABNORMAL HIGH (ref 1.005–1.030)
Urobilinogen, UA: 1 mg/dL (ref 0.0–1.0)
pH: 5.5 (ref 5.0–8.0)

## 2010-12-01 LAB — URINE MICROSCOPIC-ADD ON

## 2010-12-01 MED ORDER — OXYCODONE-ACETAMINOPHEN 7.5-325 MG PO TABS: 1.0000 | ORAL_TABLET | ORAL | Status: AC | PRN

## 2010-12-01 MED ORDER — OXYCODONE-ACETAMINOPHEN 5-325 MG PO TABS
1.0000 | ORAL_TABLET | Freq: Once | ORAL | Status: AC
  Administered 2010-12-01: 1 via ORAL
  Filled 2010-12-01: qty 1

## 2010-12-01 MED ORDER — CEPHALEXIN 500 MG PO CAPS
500.0000 mg | ORAL_CAPSULE | Freq: Once | ORAL | Status: AC
  Administered 2010-12-01: 500 mg via ORAL
  Filled 2010-12-01: qty 1

## 2010-12-01 MED ORDER — CEPHALEXIN 500 MG PO CAPS: 500.0000 mg | ORAL_CAPSULE | Freq: Three times a day (TID) | ORAL | Status: AC

## 2010-12-01 NOTE — ED Provider Notes (Addendum)
History     Chief Complaint  Patient presents with  . Back Pain    Pt still having pain from last visit July 3rd. did not fill rx. pt upset and agravted at triage questions. Found ride to ED but did not go by pharmacy to get RX.    Patient is a 77 y.o. female presenting with back pain. The history is provided by the patient.  Back Pain  This is a new problem. The current episode started more than 2 days ago (pain started five days ago - the day after she had done some lifting.). The problem occurs constantly. The problem has not changed since onset.The pain is associated with lifting heavy objects. The pain is present in the lumbar spine (pain is also in the right paralumbar area). The quality of the pain is described as shooting. The pain does not radiate. The pain is at a severity of 8/10. The pain is severe. The symptoms are aggravated by bending and twisting. Pertinent negatives include no fever and no dysuria. Associated symptoms comments: She has been having sweats at home.. Treatments tried: She has taken Vicodin with no relief. She was given a prescription for Percocet, but has not had it filled.    Past Medical History  Diagnosis Date  . Back pain   . Arthritis   . Cancer   . Hypertension   . Acid reflux   . Thyroid disease   . Edema     Past Surgical History  Procedure Date  . Breast surgery   . Tonsillectomy   . Appendectomy   . Hernia repair   . Cholecystectomy   . Total knee revision   . Hip fracture surgery     History reviewed. No pertinent family history.  History  Substance Use Topics  . Smoking status: Never Smoker   . Smokeless tobacco: Not on file  . Alcohol Use: No    OB History    Grav Para Term Preterm Abortions TAB SAB Ect Mult Living                  Review of Systems  Constitutional: Negative for fever.  Genitourinary: Negative for dysuria.  Musculoskeletal: Positive for back pain.  All other systems reviewed and are  negative.    Physical Exam  BP 166/87  Pulse 108  Temp(Src) 98.6 F (37 C) (Oral)  Resp 20  Ht 5' (1.524 m)  Wt 155 lb (70.308 kg)  BMI 30.27 kg/m2  SpO2 96%  Physical Exam  Constitutional: She is oriented to person, place, and time. She appears well-developed and well-nourished.  HENT:  Head: Normocephalic and atraumatic.  Right Ear: External ear normal.  Left Ear: External ear normal.  Mouth/Throat: Oropharynx is clear and moist.       Her lips are dry and cracked.  Eyes: Conjunctivae and EOM are normal. Pupils are equal, round, and reactive to light.  Neck: Normal range of motion. Neck supple.  Cardiovascular: Normal rate, regular rhythm and normal heart sounds.   Pulmonary/Chest: Effort normal and breath sounds normal. She has no wheezes. She has no rales.  Abdominal: Soft. Bowel sounds are normal. She exhibits mass. There is no tenderness.  Musculoskeletal: Normal range of motion. She exhibits edema.       Moderate tenderness in the lower lumbar and right paralumbar areas. Negative SLR. 1+ pretibial edema.  Lymphadenopathy:    She has no cervical adenopathy.  Neurological: She is alert and oriented to person, place,  and time. No cranial nerve deficit. Coordination normal.  Skin: Skin is warm and dry. No rash noted.       Venous stasis changes present in both lower legs.  Psychiatric: She has a normal mood and affect.    ED Course  Procedures  MDM ED chart from 11/27/2010 reviewed. She had chest x-ray, T-spine x-ray and urinalysis done. She was given IV Dilaudid followed by oral Percocet and given a prescription for Percocet 5-325. No imaging of L-spine done, so will get L-spine x-rays.  Inadequate pain relief from Percocet 5/325 - will send home on Percocet 7.5/325. Keflex ordered for UTI.  Dione Booze, MD 12/01/10 1259  Dione Booze, MD 12/01/10 1322

## 2010-12-01 NOTE — ED Notes (Signed)
Pt to radiology via w/c. NAD at this time. Pt stable. Pain medication given and urine sent to lab.

## 2010-12-05 LAB — URINE CULTURE
Colony Count: 35000
Culture  Setup Time: 201207071933

## 2010-12-06 NOTE — ED Notes (Signed)
Patient treated with Cephalexin-sensitive to same-chart appended per protocol MD. 

## 2011-02-26 LAB — URINALYSIS, ROUTINE W REFLEX MICROSCOPIC
Glucose, UA: NEGATIVE mg/dL
Glucose, UA: NEGATIVE
Hgb urine dipstick: NEGATIVE
Ketones, ur: NEGATIVE mg/dL
Nitrite: POSITIVE — AB
Nitrite: NEGATIVE
Protein, ur: NEGATIVE mg/dL
Specific Gravity, Urine: 1.01 (ref 1.005–1.030)
Specific Gravity, Urine: >1.03 — ABNORMAL HIGH
Urobilinogen, UA: 1 mg/dL (ref 0.0–1.0)
pH: 6.5 (ref 5.0–8.0)
pH: 5.5

## 2011-02-26 LAB — DIFFERENTIAL
Basophils Absolute: 0 10*3/uL (ref 0.0–0.1)
Basophils Absolute: 0 10*3/uL (ref 0.0–0.1)
Basophils Absolute: 0 10*3/uL (ref 0.0–0.1)
Basophils Absolute: 0 10*3/uL (ref 0.0–0.1)
Basophils Relative: 0 % (ref 0–1)
Basophils Relative: 1 % (ref 0–1)
Basophils Relative: 1 % (ref 0–1)
Eosinophils Absolute: 0.1 10*3/uL (ref 0.0–0.7)
Eosinophils Absolute: 0.2 10*3/uL (ref 0.0–0.7)
Eosinophils Absolute: 0.3 10*3/uL (ref 0.0–0.7)
Eosinophils Absolute: 0.2
Eosinophils Relative: 3 % (ref 0–5)
Eosinophils Relative: 5 % (ref 0–5)
Eosinophils Relative: 2
Lymphocytes Relative: 19 % (ref 12–46)
Lymphocytes Relative: 26 % (ref 12–46)
Lymphocytes Relative: 13
Lymphs Abs: 1.2 10*3/uL (ref 0.7–4.0)
Lymphs Abs: 1.1
Monocytes Absolute: 0.6 10*3/uL (ref 0.1–1.0)
Monocytes Absolute: 0.6 10*3/uL (ref 0.1–1.0)
Monocytes Relative: 6 % (ref 3–12)
Monocytes Relative: 7 % (ref 3–12)
Monocytes Relative: 7
Neutro Abs: 3.4 10*3/uL (ref 1.7–7.7)
Neutro Abs: 5.3 10*3/uL (ref 1.7–7.7)
Neutro Abs: 5.6 10*3/uL (ref 1.7–7.7)
Neutrophils Relative %: 60 % (ref 43–77)
Neutrophils Relative %: 66 % (ref 43–77)
Neutrophils Relative %: 81 % — ABNORMAL HIGH (ref 43–77)
Neutrophils Relative %: 78 — ABNORMAL HIGH

## 2011-02-26 LAB — CBC
HCT: 35 % — ABNORMAL LOW (ref 36.0–46.0)
HCT: 36.6
Hemoglobin: 10.9 g/dL — ABNORMAL LOW (ref 12.0–15.0)
Hemoglobin: 11.3 g/dL — ABNORMAL LOW (ref 12.0–15.0)
Hemoglobin: 11.4 g/dL — ABNORMAL LOW (ref 12.0–15.0)
MCHC: 32.1 g/dL (ref 30.0–36.0)
MCHC: 32.7 g/dL (ref 30.0–36.0)
MCV: 87.1 fL (ref 78.0–100.0)
MCV: 87.1 fL (ref 78.0–100.0)
MCV: 87.9 fL (ref 78.0–100.0)
MCV: 87.5
Platelets: 201 10*3/uL (ref 150–400)
Platelets: 202 10*3/uL (ref 150–400)
Platelets: 219 10*3/uL (ref 150–400)
RBC: 4.02 MIL/uL (ref 3.87–5.11)
RBC: 4.23 MIL/uL (ref 3.87–5.11)
RBC: 4.19
RDW: 15.2 % (ref 11.5–15.5)
RDW: 15.2 % (ref 11.5–15.5)
RDW: 15.4 % (ref 11.5–15.5)
RDW: 15.4 % (ref 11.5–15.5)
WBC: 5.6 10*3/uL (ref 4.0–10.5)
WBC: 7.6 10*3/uL (ref 4.0–10.5)
WBC: 8.8

## 2011-02-26 LAB — BASIC METABOLIC PANEL
BUN: 23 mg/dL (ref 6–23)
CO2: 30 mEq/L (ref 19–32)
CO2: 32 mEq/L (ref 19–32)
Calcium: 9.6 mg/dL (ref 8.4–10.5)
Calcium: 9.9 mg/dL (ref 8.4–10.5)
Chloride: 105 mEq/L (ref 96–112)
Chloride: 106
Creatinine, Ser: 0.95 mg/dL (ref 0.4–1.2)
GFR calc Af Amer: >60
GFR calc non Af Amer: >60 mL/min (ref >60)
GFR calc non Af Amer: 58 — ABNORMAL LOW
Glucose, Bld: 102 mg/dL — ABNORMAL HIGH (ref 70–99)
Glucose, Bld: 107 mg/dL — ABNORMAL HIGH (ref 70–99)
Glucose, Bld: 98 mg/dL (ref 70–99)
Potassium: 4.5
Sodium: 143 mEq/L (ref 135–145)
Sodium: 146 mEq/L — ABNORMAL HIGH (ref 135–145)
Sodium: 141

## 2011-02-26 LAB — URINE CULTURE: Colony Count: >=100000

## 2011-02-26 LAB — URINE MICROSCOPIC-ADD ON

## 2011-02-26 LAB — B-NATRIURETIC PEPTIDE (CONVERTED LAB): Pro B Natriuretic peptide (BNP): 32.4

## 2011-03-15 ENCOUNTER — Other Ambulatory Visit (HOSPITAL_COMMUNITY): Payer: Self-pay | Admitting: Oncology

## 2011-03-15 DIAGNOSIS — C50919 Malignant neoplasm of unspecified site of unspecified female breast: Secondary | ICD-10-CM

## 2011-04-10 ENCOUNTER — Ambulatory Visit (HOSPITAL_COMMUNITY)
Admission: RE | Admit: 2011-04-10 | Discharge: 2011-04-10 | Disposition: A | Discharge status: Home / Self Care | Payer: Medicare Other | Source: Ambulatory Visit | Attending: Oncology | Admitting: Oncology

## 2011-04-10 DIAGNOSIS — C50919 Malignant neoplasm of unspecified site of unspecified female breast: Secondary | ICD-10-CM

## 2011-04-10 DIAGNOSIS — Z853 Personal history of malignant neoplasm of breast: Secondary | ICD-10-CM | POA: Insufficient documentation

## 2011-08-15 ENCOUNTER — Other Ambulatory Visit (HOSPITAL_COMMUNITY): Payer: Self-pay | Admitting: Internal Medicine

## 2011-08-15 DIAGNOSIS — Z139 Encounter for screening, unspecified: Secondary | ICD-10-CM

## 2011-09-20 ENCOUNTER — Other Ambulatory Visit: Payer: Self-pay | Admitting: Obstetrics & Gynecology

## 2011-09-20 ENCOUNTER — Other Ambulatory Visit (HOSPITAL_COMMUNITY)
Admission: RE | Admit: 2011-09-20 | Discharge: 2011-09-20 | Disposition: A | Discharge status: Home / Self Care | Payer: Medicare Other | Source: Ambulatory Visit | Attending: Obstetrics & Gynecology | Admitting: Obstetrics & Gynecology

## 2011-09-20 DIAGNOSIS — Z1159 Encounter for screening for other viral diseases: Secondary | ICD-10-CM | POA: Insufficient documentation

## 2011-09-20 DIAGNOSIS — Z01419 Encounter for gynecological examination (general) (routine) without abnormal findings: Secondary | ICD-10-CM | POA: Insufficient documentation

## 2011-10-22 ENCOUNTER — Other Ambulatory Visit (HOSPITAL_COMMUNITY): Payer: Medicare Other

## 2011-11-12 ENCOUNTER — Other Ambulatory Visit (HOSPITAL_COMMUNITY): Payer: Medicare Other

## 2011-11-14 ENCOUNTER — Encounter (HOSPITAL_COMMUNITY): Payer: Self-pay | Admitting: Oncology

## 2011-11-14 ENCOUNTER — Other Ambulatory Visit (HOSPITAL_COMMUNITY): Payer: Self-pay | Admitting: Oncology

## 2011-11-14 DIAGNOSIS — C50919 Malignant neoplasm of unspecified site of unspecified female breast: Secondary | ICD-10-CM

## 2011-11-14 HISTORY — DX: Malignant neoplasm of unspecified site of unspecified female breast: C50.919

## 2011-11-14 MED ORDER — ANASTROZOLE 1 MG PO TABS: 1.0000 mg | ORAL_TABLET | Freq: Every day | ORAL | Status: DC

## 2011-11-21 ENCOUNTER — Telehealth (HOSPITAL_COMMUNITY): Payer: Self-pay | Admitting: *Deleted

## 2011-11-21 NOTE — Telephone Encounter (Signed)
Mailed no show letter to pt.

## 2011-12-16 ENCOUNTER — Other Ambulatory Visit (HOSPITAL_COMMUNITY): Payer: Self-pay | Admitting: Oncology

## 2011-12-16 DIAGNOSIS — C50919 Malignant neoplasm of unspecified site of unspecified female breast: Secondary | ICD-10-CM

## 2011-12-16 MED ORDER — ANASTROZOLE 1 MG PO TABS: 1.0000 mg | ORAL_TABLET | Freq: Every day | ORAL | Status: DC

## 2011-12-20 ENCOUNTER — Ambulatory Visit (HOSPITAL_COMMUNITY): Payer: Medicare Other | Admitting: Oncology

## 2012-01-20 ENCOUNTER — Encounter (HOSPITAL_COMMUNITY): Payer: Self-pay | Admitting: Oncology

## 2012-01-20 ENCOUNTER — Encounter (HOSPITAL_COMMUNITY): Payer: Medicare Other | Attending: Oncology | Admitting: Oncology

## 2012-01-20 VITALS — BP 134/68 | HR 69 | Temp 97.2°F | Resp 18 | Wt 142.8 lb

## 2012-01-20 DIAGNOSIS — M4000 Postural kyphosis, site unspecified: Secondary | ICD-10-CM | POA: Insufficient documentation

## 2012-01-20 DIAGNOSIS — M810 Age-related osteoporosis without current pathological fracture: Secondary | ICD-10-CM | POA: Insufficient documentation

## 2012-01-20 DIAGNOSIS — Z170 Estrogen receptor positive status [ER+]: Secondary | ICD-10-CM

## 2012-01-20 DIAGNOSIS — L97909 Non-pressure chronic ulcer of unspecified part of unspecified lower leg with unspecified severity: Secondary | ICD-10-CM | POA: Insufficient documentation

## 2012-01-20 DIAGNOSIS — C50919 Malignant neoplasm of unspecified site of unspecified female breast: Secondary | ICD-10-CM | POA: Insufficient documentation

## 2012-01-20 DIAGNOSIS — R1900 Intra-abdominal and pelvic swelling, mass and lump, unspecified site: Secondary | ICD-10-CM | POA: Insufficient documentation

## 2012-01-20 LAB — COMPREHENSIVE METABOLIC PANEL
Alkaline Phosphatase: 89 U/L (ref 39–117)
BUN: 14 mg/dL (ref 6–23)
CO2: 29 mEq/L (ref 19–32)
Calcium: 10 mg/dL (ref 8.4–10.5)
GFR calc Af Amer: 63 mL/min — ABNORMAL LOW (ref >90)
GFR calc non Af Amer: 54 mL/min — ABNORMAL LOW (ref >90)
Glucose, Bld: 89 mg/dL (ref 70–99)
Total Protein: 6.5 g/dL (ref 6.0–8.3)

## 2012-01-20 LAB — CBC WITH DIFFERENTIAL/PLATELET
Eosinophils Absolute: 0.1 10*3/uL (ref 0.0–0.7)
Eosinophils Relative: 2 % (ref 0–5)
HCT: 39.1 % (ref 36.0–46.0)
Hemoglobin: 12.1 g/dL (ref 12.0–15.0)
Lymphocytes Relative: 36 % (ref 12–46)
Lymphs Abs: 2.2 10*3/uL (ref 0.7–4.0)
MCH: 27.2 pg (ref 26.0–34.0)
MCV: 87.9 fL (ref 78.0–100.0)
Monocytes Relative: 8 % (ref 3–12)
RBC: 4.45 MIL/uL (ref 3.87–5.11)

## 2012-01-20 NOTE — Patient Instructions (Addendum)
Ashley [name]  DOB 03-15-1932 CSN [id number]  MRN [id number] Dr. Glenford Peers   [facility]  Discharge Instructions  RECOMMENDATIONS MADE BY THE CONSULTANT AND ANY TEST RESULTS WILL BE SENT TO YOUR REFERRING DOCTOR.   EXAM FINDINGS BY MD TODAY AND SIGNS AND SYMPTOMS TO REPORT TO CLINIC OR PRIMARY MD: Need to check some blood work today and get a bone density study performed to see what's going on with your bones.  We may need to stop the arimidex but will have to see what your results are.  MEDICATIONS PRESCRIBED: none   INSTRUCTIONS GIVEN AND DISCUSSED:   SPECIAL INSTRUCTIONS/FOLLOW-UP: Lab work Needed today, Xray Studies Needed:  Bone Density and Return to Clinic in 1 month to see the PA.   I acknowledge that I have been informed and understand all the instructions given to me and received a copy. I do not have any more questions at this time, but understand that I may call the Specialty Clinic at [facility] at [phone] during business hours should I have any further questions or need assistance in obtaining follow-up care.    __________________________________________  _____________  __________ Signature of Patient or Authorized Representative            Date                   Time    __________________________________________ Nurse's Signature

## 2012-01-20 NOTE — Progress Notes (Signed)
#  1 stage IB, grade 1, ER +99%, PR +100%, HER-2/neu negative, left-sided breast cancer 8 mm in size status post lumpectomy and sentinel lymph node biopsy which was negative. K-ras etc. marker was low 6% with surgery on 04/24/2009. She has been on Arimidex since then but has not been here for 2 years. She tolerates it without side effects.  #2 possible bowel mass in the midline slightly to the left of center just above the symphysis pubis. She needs an ultrasound to confirm or refute #3 kyphosis and severe osteoporosis and she declined our intervention 2 years ago with Zometa but supposedly has been taking calcium and vitamin D. She needs a repeat bone density. #4 ulcers on her legs secondary to what it appears to be venous insufficiency and she is seeing Dr. Jonnie Kind for this in [place]. She has both legs are wrapped very nicely today.  Nishita for some reason has not been here for 2 years. She does not state wife. She is more kyphotic than I have ever seen her. She is no tall her than 60 inches and she is to be 66 inches. She has severe kyphosis. She has no lymphadenopathy however in her vital signs are stable otherwise except for her weight which was 173 pounds in May 2011 and she is now 142 pounds. She doesn't complain of abdominal pain but has some back discomfort and the leg ulcers. She has no obvious breast masses abdomen though I think there is about a 6 cm mass just above the symphysis pubis. I do not feel adenopathy in any location her lungs are clear heart shows a regular rhythm and rate with a grade 1/6 systolic ejection murmur. Once again her legs are wrapped very tightly I did not remove them. I therefore want to get blood work today get an ultrasound of her pelvis and her bone density needs to be repeated and we need to see her back I may or may not continue the Arimidex. I am worried if her bone density is not any better that the Arimidex is a contraindication for her going forward. She did  not want radiation so we went with the Arimidex but that may be a liability now if her bones are getting worse. With her stage IB tumor she still has an excellent prognosis

## 2012-01-23 ENCOUNTER — Ambulatory Visit (HOSPITAL_COMMUNITY)
Admission: RE | Admit: 2012-01-23 | Discharge: 2012-01-23 | Disposition: A | Discharge status: Home / Self Care | Payer: Medicare Other | Source: Ambulatory Visit | Attending: Oncology | Admitting: Oncology

## 2012-01-23 DIAGNOSIS — C50919 Malignant neoplasm of unspecified site of unspecified female breast: Secondary | ICD-10-CM | POA: Insufficient documentation

## 2012-01-23 DIAGNOSIS — M810 Age-related osteoporosis without current pathological fracture: Secondary | ICD-10-CM

## 2012-01-23 DIAGNOSIS — M818 Other osteoporosis without current pathological fracture: Secondary | ICD-10-CM | POA: Insufficient documentation

## 2012-01-28 ENCOUNTER — Ambulatory Visit (HOSPITAL_COMMUNITY)
Admission: RE | Admit: 2012-01-28 | Discharge: 2012-01-28 | Disposition: A | Discharge status: Home / Self Care | Payer: Medicare Other | Source: Ambulatory Visit | Attending: Oncology | Admitting: Oncology

## 2012-01-28 ENCOUNTER — Other Ambulatory Visit (HOSPITAL_COMMUNITY): Payer: Self-pay | Admitting: Oncology

## 2012-01-28 DIAGNOSIS — C50919 Malignant neoplasm of unspecified site of unspecified female breast: Secondary | ICD-10-CM

## 2012-01-28 DIAGNOSIS — M810 Age-related osteoporosis without current pathological fracture: Secondary | ICD-10-CM

## 2012-01-28 DIAGNOSIS — R1900 Intra-abdominal and pelvic swelling, mass and lump, unspecified site: Secondary | ICD-10-CM

## 2012-01-29 ENCOUNTER — Other Ambulatory Visit (HOSPITAL_COMMUNITY): Payer: Medicare Other

## 2012-01-29 ENCOUNTER — Other Ambulatory Visit (HOSPITAL_COMMUNITY): Payer: Self-pay | Admitting: *Deleted

## 2012-01-29 NOTE — Progress Notes (Signed)
error 

## 2012-02-04 ENCOUNTER — Ambulatory Visit (HOSPITAL_COMMUNITY)
Admission: RE | Admit: 2012-02-04 | Discharge: 2012-02-04 | Disposition: A | Discharge status: Home / Self Care | Payer: Medicare Other | Source: Ambulatory Visit | Attending: Oncology | Admitting: Oncology

## 2012-02-04 ENCOUNTER — Encounter (HOSPITAL_COMMUNITY): Payer: Self-pay

## 2012-02-04 DIAGNOSIS — R1900 Intra-abdominal and pelvic swelling, mass and lump, unspecified site: Secondary | ICD-10-CM | POA: Insufficient documentation

## 2012-02-04 DIAGNOSIS — R1032 Left lower quadrant pain: Secondary | ICD-10-CM | POA: Insufficient documentation

## 2012-02-04 DIAGNOSIS — N83209 Unspecified ovarian cyst, unspecified side: Secondary | ICD-10-CM | POA: Insufficient documentation

## 2012-02-04 DIAGNOSIS — C50919 Malignant neoplasm of unspecified site of unspecified female breast: Secondary | ICD-10-CM

## 2012-02-04 DIAGNOSIS — M810 Age-related osteoporosis without current pathological fracture: Secondary | ICD-10-CM

## 2012-02-04 MED ORDER — IOHEXOL 300 MG/ML  SOLN
100.0000 mL | Freq: Once | INTRAMUSCULAR | Status: AC | PRN
  Administered 2012-02-04: 100 mL via INTRAVENOUS

## 2012-02-20 ENCOUNTER — Encounter (HOSPITAL_COMMUNITY): Payer: Self-pay | Admitting: Oncology

## 2012-02-20 ENCOUNTER — Encounter (HOSPITAL_COMMUNITY): Payer: Medicare Other | Attending: Oncology | Admitting: Oncology

## 2012-02-20 VITALS — BP 132/72 | HR 64 | Temp 97.3°F | Resp 18 | Wt 141.4 lb

## 2012-02-20 DIAGNOSIS — N83209 Unspecified ovarian cyst, unspecified side: Secondary | ICD-10-CM

## 2012-02-20 DIAGNOSIS — R1909 Other intra-abdominal and pelvic swelling, mass and lump: Secondary | ICD-10-CM

## 2012-02-20 DIAGNOSIS — M4000 Postural kyphosis, site unspecified: Secondary | ICD-10-CM | POA: Insufficient documentation

## 2012-02-20 DIAGNOSIS — N83201 Unspecified ovarian cyst, right side: Secondary | ICD-10-CM

## 2012-02-20 DIAGNOSIS — N83202 Unspecified ovarian cyst, left side: Secondary | ICD-10-CM

## 2012-02-20 DIAGNOSIS — M810 Age-related osteoporosis without current pathological fracture: Secondary | ICD-10-CM | POA: Insufficient documentation

## 2012-02-20 DIAGNOSIS — L97909 Non-pressure chronic ulcer of unspecified part of unspecified lower leg with unspecified severity: Secondary | ICD-10-CM | POA: Insufficient documentation

## 2012-02-20 DIAGNOSIS — R1900 Intra-abdominal and pelvic swelling, mass and lump, unspecified site: Secondary | ICD-10-CM | POA: Insufficient documentation

## 2012-02-20 DIAGNOSIS — C50919 Malignant neoplasm of unspecified site of unspecified female breast: Secondary | ICD-10-CM | POA: Insufficient documentation

## 2012-02-20 HISTORY — DX: Unspecified ovarian cyst, right side: N83.201

## 2012-02-20 NOTE — Patient Instructions (Addendum)
[facility]  Discharge Instructions  RECOMMENDATIONS MADE BY THE CONSULTANT AND ANY TEST RESULTS WILL BE SENT TO YOUR REFERRING DOCTOR.   Call Dr.Eure's office to schedule a follow up appointment with him in the next 3-4 weeks.  Continue the Arimidex as prescribed. Return to clinic as scheduled to see MD. Report any issues/concerns to clinic as needed.   I acknowledge that I have been informed and understand all the instructions given to me and received a copy. I do not have any more questions at this time, but understand that I may call the Specialty Clinic at [facility] at [phone] during business hours should I have any further questions or need assistance in obtaining follow-up care.    __________________________________________  _____________  __________ Signature of Patient or Authorized Representative            Date                   Time    __________________________________________ Nurse's Signature

## 2012-02-20 NOTE — Progress Notes (Signed)
Ashley [name]., MD [address] [place] [address]  1. Left-sided breast cancer   2. Bilateral ovarian cysts     CURRENT THERAPY: On Arimidex daily  INTERVAL HISTORY: Ashley [name] 78 y.o. female returns for  regular  visit for followup of abdominal mass AND Stage IB, grade 1, ER +99%, PR +100%, HER-2/neu negative, left-sided breast cancer 8 mm in size status post lumpectomy and sentinel lymph node biopsy which was negative. K-ras etc. marker was low 6% with surgery on 04/24/2009. She has been on Arimidex since then but has not been here for 2 years. She tolerates it without side effects.   We spoke about bone density test which revealed osteoporosis but she was more interested in her medical bills regarding her knee.  She has many medical books from [facility] and she likes to read these.  Therefore, she reports to me, that she is knowledgeable with regards to medicine.  I personally reviewed and went over radiographic studies with the patient.  We discussed her Bone density tests which shows osteoporosis.  We discussed treatment options including Ca and Vit D.  We discussed Prolia and Zometa treatments.  She would like to try Prolia if it is covered by her insurance.  So we will discuss this with our Financial Counselor to investigate the cost.   She is seeing the wound clinic for her LE edema and wounds.  We also discussd her Transvaginal ultrasound which did identify the ovarian cysts and the abominal mass.  A subsequent CT of Abd/pelvis illustrated the ovarian cysts but could not demonstarte the abdominal mass.    She reports that she saw Dr. Despina Hidden a few months ago for a PAP smear and HPV testing.  Her PAP smear revealed atypical cells.     Past Medical History  Diagnosis Date  . Back pain   . Arthritis   . Hypertension   . Acid reflux   . Thyroid disease   . Edema   . Cancer   . Left-sided breast cancer 11/14/2011    Started Arimidex on 07/25/2009.  Stage IB, grade 1, ER 99%, PR 100%, Her2 negative, left-sided breast cancer, 8 mm in size, S/P lumpectomy on 04/24/2010, and sentinel node biopsy.  Ki-67 marker 6%.  . Bilateral ovarian cysts 02/20/2012    has HYPOTHYROIDISM; HYPERTENSION; GERD; Left-sided breast cancer; and Bilateral ovarian cysts on her problem list.     is allergic to codeine phosphate; erythromycin ethylsuccinate; levofloxacin; penicillins; sulfamethoxazole; and tetracycline hcl.  Ashley [name] had no medications administered during this visit.  Past Surgical History  Procedure Date  . Breast surgery   . Tonsillectomy   . Appendectomy   . Hernia repair   . Cholecystectomy   . Total knee revision   . Hip fracture surgery     Denies any headaches, dizziness, double vision, fevers, chills, night sweats, nausea, vomiting, diarrhea, constipation, chest pain, heart palpitations, shortness of breath, blood in stool, black tarry stool, urinary pain, urinary burning, urinary frequency, hematuria.   PHYSICAL EXAMINATION  ECOG PERFORMANCE STATUS: 1 - Symptomatic but completely ambulatory  Filed Vitals:   02/20/12 1200  BP: 132/72  Pulse: 64  Temp: 97.3 F (36.3 C)  Resp: 18    GENERAL:alert, no distress, well nourished, well developed, comfortable, cooperative, obese and smiling, hunched with kyphosis. SKIN: skin color, texture, turgor are normal, no rashes or significant lesions HEAD: Normocephalic, No masses, lesions, tenderness or abnormalities EYES: normal, Conjunctiva are pink and  non-injected EARS: External ears normal OROPHARYNX:lips, buccal mucosa, and tongue normal and mucous membranes are moist  NECK: supple, trachea midline LYMPH:  no palpable lymphadenopathy BREAST:patient declined LUNGS: clear to auscultation and percussion HEART: regular rate & rhythm, 1/6 systolic ejection murmur, no gallops, S1 normal and S2 normal ABDOMEN:abdomen soft, non-tender, obese, normal bowel sounds, 5 cm suprapubic mass  that is hard and nodular in nature and no hepatosplenomegaly BACK: kyphosis EXTREMITIES:less then 2 second capillary refill, no skin discoloration, no clubbing, no cyanosis, LEs cleanly wrapped and not examined.   NEURO: alert & oriented x 3 with fluent speech, no focal motor/sensory deficits, gait normal    LABORATORY DATA: CBC    Component Value Date/Time   WBC 6.1 01/20/2012 1500   RBC 4.45 01/20/2012 1500   HGB 12.1 01/20/2012 1500   HCT 39.1 01/20/2012 1500   PLT 147* 01/20/2012 1500   MCV 87.9 01/20/2012 1500   MCH 27.2 01/20/2012 1500   MCHC 30.9 01/20/2012 1500   RDW 15.1 01/20/2012 1500   LYMPHSABS 2.2 01/20/2012 1500   MONOABS 0.5 01/20/2012 1500   EOSABS 0.1 01/20/2012 1500   BASOSABS 0.0 01/20/2012 1500      Chemistry      Component Value Date/Time   NA 138 01/20/2012 1500   K 4.3 01/20/2012 1500   CL 104 01/20/2012 1500   CO2 29 01/20/2012 1500   BUN 14 01/20/2012 1500   CREATININE 0.98 01/20/2012 1500      Component Value Date/Time   CALCIUM 10.0 01/20/2012 1500   ALKPHOS 89 01/20/2012 1500   AST 16 01/20/2012 1500   ALT 11 01/20/2012 1500   BILITOT 0.4 01/20/2012 1500       RADIOGRAPHIC STUDIES:  02/04/2012  Addendum     Kary Kos, MD Tue Feb 04, 2012 2:34:19 PM EDT       **ADDENDUM** CREATED: 02/04/2012 14:28:43  Unlikely that the lesion beneath the right hemidiaphragm is  isolated peritoneal metastasis; therefore consider follow-up in 6  months to 1 year rather than the previously recommended 3 months or  FDG PET scan.  **END ADDENDUM** SIGNED BY: Genevive Bi, M.D.      Study Result     *RADIOLOGY REPORT*  Clinical Data: Mass in the abdomen. Ovarian lesions. Left lower  quadrant tenderness.  CT ABDOMEN AND PELVIS WITH CONTRAST  Technique: Multidetector CT imaging of the abdomen and pelvis was  performed following the standard protocol during bolus  administration of intravenous contrast.  Contrast: OMNIPAQUE IOHEXOL 300 MG/ML SOLN    Comparison: Chest radiograph 11/27/2011 pelvic ultrasound  01/28/2012  Findings: Lung bases are clear. There is a large hiatal hernia  with the entirety of the stomach above the hemidiaphragms. The GE  junction is also above the diaphragm.  No focal hepatic lesion. The gallbladder is surgically absent.  The pancreas, spleen, adrenal glands, and kidneys are normal.  Beneath the right hemidiaphragm, there is 12 mm nodule (image #7).  No additional evidence of peritoneal disease or nodularity. No  retrocrural or retroperitoneal lymphadenopathy.  The small bowel and colon are normal.  Abdominal aorta normal caliber.  In the pelvis there is a rounded cystic lesion associated with the  left ovary measuring 4.4 by 3.0 cm. This has simple fluid  attenuation. Smaller cyst associated with the left ovary right  ovary measuring 3.0 x 2.3 cm. Uterus is small. The bladder is  normal. No pelvic lymphadenopathy. Review of bone windows  demonstrates no aggressive osseous lesions.  Review of bone windows demonstrates severe compression fracture at  L1 with 80% loss vertebral body height anteriorly.  No evidence of abdominal mass to explain the ultrasound findings.  IMPRESSION:  1. No evidence of abdominal or pelvic mass to correlate with the  ultrasound findings.  2. Bilateral ovarian cysts with the largest cyst measuring 4.5 cm  on the left. This is almost certainly benign, but follow up  ultrasound is recommended in 1 year according to the Society of  Radiologists in Ultrasound 2010 Consensus Conference Statement (D  Lenis Noon et al. Management of Asymptomatic Ovarian and Other Adnexal  Cysts Imaged at [place]: Society of Radiologists in Ultrasound  Consensus Conference Statement 2010. Radiology 256 (Sept 2010):  943-954.).  3. Nodule beneath the right hemidiaphragm is nonspecific. No  additional evidence of peritoneal disease. Recommend either FDG  PET / CT scan versus close interval follow-up with CT  in 3 months  with IV contrast. Lesion would be difficult to biopsy.  Original Report Authenticated By: Genevive Bi, M.D.        ASSESSMENT:  1. Stage IB, grade 1, ER +99%, PR +100%, HER-2/neu negative, left-sided breast cancer 8 mm in size status post lumpectomy and sentinel lymph node biopsy which was negative. K-ras etc. marker was low 6% with surgery on 04/24/2009. She has been on Arimidex since then but has not been here for 2 years. She tolerates it without side effects. 2. Possible abdominal mass palpated on physical exam, seen on [place], but not identified of CT abd/pelvis.  Located in the midline slightly to the left of center just above the symphysis pubis.  3. Kyphosis and severe osteoporosis and she declined our intervention 2 years ago with Zometa but supposedly has been taking calcium and vitamin D.   4. Ulcers on her legs secondary to what it appears to be venous insufficiency and she is seeing Dr. Jonnie Kind for this in [place]. She has both legs are wrapped very nicely today.   PLAN:  1. I personally reviewed and went over radiographic studies with the patient. 2. I personally reviewed and went over laboratory results with the patient. 3. Discussed the osteoporosis medication options including continuing Calcium and Vit D in addition to Prolia versus Zometa.  She is interested in Prolia.  Will contact PCP to make sure he does not have her on anything that we are not aware of.   Spoke with Dr. Sherwood Gambler and he does not have her on any osteoporotic medications.  Waiting to hear back from our Financial Counselor regarding coverage for Prolia. 4. Continue Arimidex for now.  Will discuss further with Dr. Mariel Sleet.  5. Referral to Dr. Despina Hidden for her ovarian cysts.  She will call herself.  6. Return in 6 weeks for follow-up.    All questions were answered. The patient knows to call the clinic with any problems, questions or concerns. We can certainly see the patient much sooner if  necessary.  Roxy Filler

## 2012-03-02 ENCOUNTER — Telehealth (HOSPITAL_COMMUNITY): Payer: Self-pay

## 2012-03-02 NOTE — Telephone Encounter (Signed)
Message copied by Evelena Leyden on Mon Mar 02, 2012  6:50 PM ------      Message from: Mariel Sleet, ERIC S      Created: Mon Mar 02, 2012  5:56 PM       Let's change her from arimidex to tamoxifen if she will consider it since her bones are so bad

## 2012-03-02 NOTE — Telephone Encounter (Signed)
Message left for patient to call clinic.

## 2012-03-03 ENCOUNTER — Telehealth (HOSPITAL_COMMUNITY): Payer: Self-pay

## 2012-03-03 ENCOUNTER — Telehealth (HOSPITAL_COMMUNITY): Payer: Self-pay | Admitting: *Deleted

## 2012-03-03 NOTE — Telephone Encounter (Signed)
Message copied by Evelena Leyden on Tue Mar 03, 2012 10:56 AM ------      Message from: Ellouise Newer      Created: Tue Mar 03, 2012 10:22 AM       Call patient and see if she will consider switching to Tamoxifen in light of her worsening bone density.            ----- Message -----         From: Randall An, MD         Sent: 03/02/2012   5:56 PM           To: Ellouise Newer, PA, Onc Nurse Ap            Let's change her from arimidex to tamoxifen if she will consider it since her bones are so bad

## 2012-03-03 NOTE — Telephone Encounter (Signed)
Please call patient and discuss potential benefits and risks of taking Tamoxifen.  Has some concerns regarding her lymphedema.  She will consider it but wants to discuss further before making up her mind.

## 2012-03-06 ENCOUNTER — Other Ambulatory Visit (HOSPITAL_COMMUNITY): Payer: Self-pay | Admitting: Oncology

## 2012-03-06 ENCOUNTER — Encounter (HOSPITAL_COMMUNITY): Payer: Self-pay | Admitting: Oncology

## 2012-03-06 ENCOUNTER — Telehealth (HOSPITAL_COMMUNITY): Payer: Self-pay | Admitting: Oncology

## 2012-03-06 DIAGNOSIS — C50919 Malignant neoplasm of unspecified site of unspecified female breast: Secondary | ICD-10-CM

## 2012-03-06 DIAGNOSIS — M810 Age-related osteoporosis without current pathological fracture: Secondary | ICD-10-CM

## 2012-03-06 HISTORY — DX: Age-related osteoporosis without current pathological fracture: M81.0

## 2012-03-06 MED ORDER — TAMOXIFEN CITRATE 20 MG PO TABS: 20.0000 mg | ORAL_TABLET | Freq: Every day | ORAL | Status: DC

## 2012-03-06 NOTE — Telephone Encounter (Signed)
Discussed osteoporosis medications.  She is agreeable to take Prolia every 6 months.  This supportive therapy plan was built.   We discussed switching from Arimidex to Tamoxifen in light of her worsening bone density and osteoporosis.  We again discussed the need for her to follow-up with Dr. Despina Hidden and she reports that she will soon.   We discussed the risks, benefits, alternatives, and side effects of Tamoxifen including the risk of blood clots.   She is agreeable to the above plan.   Ashley [name]

## 2012-03-13 ENCOUNTER — Telehealth (HOSPITAL_COMMUNITY): Payer: Self-pay

## 2012-03-25 ENCOUNTER — Telehealth (HOSPITAL_COMMUNITY): Payer: Self-pay | Admitting: *Deleted

## 2012-03-25 DIAGNOSIS — C50919 Malignant neoplasm of unspecified site of unspecified female breast: Secondary | ICD-10-CM

## 2012-03-25 MED ORDER — ANASTROZOLE 1 MG PO TABS: 1.0000 mg | ORAL_TABLET | Freq: Every day | ORAL | Status: DC

## 2012-03-25 NOTE — Telephone Encounter (Signed)
Pt reports swelling in legs, nausea vomiting after taking tamoxifen. She has not taken any for 3 days and is very worried about not taking any cancer drug. She is adamant that she can not take tamoxifen and wants to know what she should do.  She will be home after [phone]

## 2012-03-25 NOTE — Telephone Encounter (Signed)
Pt has stopped tamoxifen and will restart arimidex. Instructed to take calcium and vitamin d for her bones since she is going back on arimidex. Arimidex e-scribed to [facility].

## 2012-03-25 NOTE — Telephone Encounter (Signed)
Message copied by Adelene Amas on Wed Mar 25, 2012  5:48 PM ------      Message from: Mariel Sleet, ERIC S      Created: Wed Mar 25, 2012 11:45 AM       Let's talk

## 2012-04-02 ENCOUNTER — Other Ambulatory Visit (HOSPITAL_COMMUNITY): Payer: Self-pay | Admitting: Oncology

## 2012-04-02 DIAGNOSIS — Z139 Encounter for screening, unspecified: Secondary | ICD-10-CM

## 2012-04-06 ENCOUNTER — Ambulatory Visit (HOSPITAL_COMMUNITY): Payer: Medicare Other | Admitting: Oncology

## 2012-04-22 ENCOUNTER — Encounter (HOSPITAL_COMMUNITY): Payer: Medicare Other

## 2012-04-29 ENCOUNTER — Ambulatory Visit (HOSPITAL_COMMUNITY)
Admission: RE | Admit: 2012-04-29 | Discharge: 2012-04-29 | Disposition: A | Discharge status: Home / Self Care | Payer: Medicare Other | Source: Ambulatory Visit | Attending: Oncology | Admitting: Oncology

## 2012-04-29 ENCOUNTER — Other Ambulatory Visit (HOSPITAL_COMMUNITY): Payer: Self-pay | Admitting: Oncology

## 2012-04-29 DIAGNOSIS — R928 Other abnormal and inconclusive findings on diagnostic imaging of breast: Secondary | ICD-10-CM | POA: Insufficient documentation

## 2012-04-29 DIAGNOSIS — Z139 Encounter for screening, unspecified: Secondary | ICD-10-CM

## 2012-05-04 ENCOUNTER — Ambulatory Visit (HOSPITAL_COMMUNITY): Payer: Medicare Other | Admitting: Oncology

## 2012-05-06 ENCOUNTER — Ambulatory Visit (HOSPITAL_COMMUNITY)
Admission: RE | Admit: 2012-05-06 | Discharge: 2012-05-06 | Disposition: A | Discharge status: Home / Self Care | Payer: Medicare Other | Source: Ambulatory Visit | Attending: Oncology | Admitting: Oncology

## 2012-05-06 ENCOUNTER — Other Ambulatory Visit (HOSPITAL_COMMUNITY): Payer: Self-pay | Admitting: Oncology

## 2012-05-06 ENCOUNTER — Other Ambulatory Visit (HOSPITAL_COMMUNITY): Payer: Medicare Other

## 2012-05-06 ENCOUNTER — Other Ambulatory Visit (HOSPITAL_COMMUNITY): Payer: Self-pay | Admitting: Internal Medicine

## 2012-05-06 ENCOUNTER — Ambulatory Visit (HOSPITAL_COMMUNITY)
Admission: RE | Admit: 2012-05-06 | Discharge: 2012-05-06 | Disposition: A | Discharge status: Home / Self Care | Payer: Medicare Other | Source: Ambulatory Visit | Attending: Internal Medicine | Admitting: Internal Medicine

## 2012-05-06 DIAGNOSIS — R229 Localized swelling, mass and lump, unspecified: Secondary | ICD-10-CM | POA: Insufficient documentation

## 2012-05-06 DIAGNOSIS — Z853 Personal history of malignant neoplasm of breast: Secondary | ICD-10-CM | POA: Insufficient documentation

## 2012-05-06 NOTE — Progress Notes (Signed)
Marcaine 0.75%         9mL injected                         Left breast biopsy performed

## 2012-06-02 ENCOUNTER — Ambulatory Visit (HOSPITAL_COMMUNITY): Payer: Medicare Other | Admitting: Oncology

## 2012-09-23 ENCOUNTER — Other Ambulatory Visit (HOSPITAL_COMMUNITY): Payer: Self-pay | Admitting: Oncology

## 2012-09-23 DIAGNOSIS — C50912 Malignant neoplasm of unspecified site of left female breast: Secondary | ICD-10-CM

## 2012-09-23 MED ORDER — ANASTROZOLE 1 MG PO TABS: 1.0000 mg | ORAL_TABLET | Freq: Every day | ORAL | Status: DC

## 2012-10-08 ENCOUNTER — Other Ambulatory Visit (HOSPITAL_COMMUNITY): Payer: Self-pay | Admitting: Internal Medicine

## 2012-10-08 ENCOUNTER — Ambulatory Visit (HOSPITAL_COMMUNITY)
Admission: RE | Admit: 2012-10-08 | Discharge: 2012-10-08 | Disposition: A | Discharge status: Home / Self Care | Payer: Medicare Other | Source: Ambulatory Visit | Attending: Internal Medicine | Admitting: Internal Medicine

## 2012-10-08 DIAGNOSIS — M8448XA Pathological fracture, other site, initial encounter for fracture: Secondary | ICD-10-CM | POA: Insufficient documentation

## 2012-10-08 DIAGNOSIS — M545 Low back pain, unspecified: Secondary | ICD-10-CM | POA: Insufficient documentation

## 2012-10-08 DIAGNOSIS — M546 Pain in thoracic spine: Secondary | ICD-10-CM | POA: Insufficient documentation

## 2012-10-08 DIAGNOSIS — M549 Dorsalgia, unspecified: Secondary | ICD-10-CM

## 2012-10-08 DIAGNOSIS — M810 Age-related osteoporosis without current pathological fracture: Secondary | ICD-10-CM | POA: Insufficient documentation

## 2012-12-26 ENCOUNTER — Encounter (HOSPITAL_COMMUNITY): Payer: Self-pay | Admitting: Emergency Medicine

## 2012-12-26 ENCOUNTER — Emergency Department (HOSPITAL_COMMUNITY)
Admission: EM | Admit: 2012-12-26 | Discharge: 2012-12-26 | Disposition: A | Discharge status: Home / Self Care | Payer: Medicare Other | Attending: Emergency Medicine | Admitting: Emergency Medicine

## 2012-12-26 ENCOUNTER — Emergency Department (HOSPITAL_COMMUNITY): Payer: Medicare Other

## 2012-12-26 DIAGNOSIS — E079 Disorder of thyroid, unspecified: Secondary | ICD-10-CM | POA: Insufficient documentation

## 2012-12-26 DIAGNOSIS — Z79899 Other long term (current) drug therapy: Secondary | ICD-10-CM | POA: Insufficient documentation

## 2012-12-26 DIAGNOSIS — Z880 Allergy status to penicillin: Secondary | ICD-10-CM | POA: Insufficient documentation

## 2012-12-26 DIAGNOSIS — R5383 Other fatigue: Secondary | ICD-10-CM | POA: Insufficient documentation

## 2012-12-26 DIAGNOSIS — K219 Gastro-esophageal reflux disease without esophagitis: Secondary | ICD-10-CM | POA: Insufficient documentation

## 2012-12-26 DIAGNOSIS — R5381 Other malaise: Secondary | ICD-10-CM | POA: Insufficient documentation

## 2012-12-26 DIAGNOSIS — M549 Dorsalgia, unspecified: Secondary | ICD-10-CM | POA: Insufficient documentation

## 2012-12-26 DIAGNOSIS — Z853 Personal history of malignant neoplasm of breast: Secondary | ICD-10-CM | POA: Insufficient documentation

## 2012-12-26 DIAGNOSIS — Z8739 Personal history of other diseases of the musculoskeletal system and connective tissue: Secondary | ICD-10-CM | POA: Insufficient documentation

## 2012-12-26 DIAGNOSIS — R1013 Epigastric pain: Secondary | ICD-10-CM | POA: Insufficient documentation

## 2012-12-26 DIAGNOSIS — Z8742 Personal history of other diseases of the female genital tract: Secondary | ICD-10-CM | POA: Insufficient documentation

## 2012-12-26 DIAGNOSIS — G8929 Other chronic pain: Secondary | ICD-10-CM | POA: Insufficient documentation

## 2012-12-26 DIAGNOSIS — R109 Unspecified abdominal pain: Secondary | ICD-10-CM

## 2012-12-26 DIAGNOSIS — I10 Essential (primary) hypertension: Secondary | ICD-10-CM | POA: Insufficient documentation

## 2012-12-26 DIAGNOSIS — I8310 Varicose veins of unspecified lower extremity with inflammation: Secondary | ICD-10-CM | POA: Insufficient documentation

## 2012-12-26 DIAGNOSIS — R112 Nausea with vomiting, unspecified: Secondary | ICD-10-CM | POA: Insufficient documentation

## 2012-12-26 LAB — BASIC METABOLIC PANEL
BUN: 12 mg/dL (ref 6–23)
Calcium: 10.1 mg/dL (ref 8.4–10.5)
Creatinine, Ser: 0.62 mg/dL (ref 0.50–1.10)
GFR calc Af Amer: >90 mL/min (ref >90)
GFR calc non Af Amer: 84 mL/min — ABNORMAL LOW (ref >90)

## 2012-12-26 LAB — HEPATIC FUNCTION PANEL
AST: 14 U/L (ref 0–37)
Albumin: 3.7 g/dL (ref 3.5–5.2)
Bilirubin, Direct: 0.3 mg/dL (ref 0.0–0.3)

## 2012-12-26 LAB — CBC
HCT: 39.3 % (ref 36.0–46.0)
MCH: 26.4 pg (ref 26.0–34.0)
MCHC: 31.3 g/dL (ref 30.0–36.0)
MCV: 84.3 fL (ref 78.0–100.0)
RDW: 15.1 % (ref 11.5–15.5)

## 2012-12-26 LAB — URINALYSIS, ROUTINE W REFLEX MICROSCOPIC
Nitrite: NEGATIVE
Urobilinogen, UA: 0.2 mg/dL (ref 0.0–1.0)

## 2012-12-26 LAB — TROPONIN I: Troponin I: <0.3 ng/mL (ref <0.30)

## 2012-12-26 LAB — URINE MICROSCOPIC-ADD ON

## 2012-12-26 LAB — LIPASE, BLOOD: Lipase: 35 U/L (ref 11–59)

## 2012-12-26 MED ORDER — ONDANSETRON HCL 4 MG/2ML IJ SOLN
4.0000 mg | Freq: Once | INTRAMUSCULAR | Status: AC
  Administered 2012-12-26: 4 mg via INTRAVENOUS
  Filled 2012-12-26: qty 2

## 2012-12-26 MED ORDER — FENTANYL CITRATE 0.05 MG/ML IJ SOLN
50.0000 ug | Freq: Once | INTRAMUSCULAR | Status: AC
  Administered 2012-12-26: 50 ug via INTRAVENOUS
  Filled 2012-12-26: qty 2

## 2012-12-26 MED ORDER — SODIUM CHLORIDE 0.9 % IV BOLUS (SEPSIS)
1000.0000 mL | Freq: Once | INTRAVENOUS | Status: AC
  Administered 2012-12-26: 1000 mL via INTRAVENOUS

## 2012-12-26 MED ORDER — ONDANSETRON 8 MG PO TBDP: ORAL_TABLET | ORAL | Status: DC

## 2012-12-26 MED ORDER — LORAZEPAM 2 MG/ML IJ SOLN
0.5000 mg | Freq: Once | INTRAMUSCULAR | Status: AC
  Administered 2012-12-26: 0.5 mg via INTRAVENOUS
  Filled 2012-12-26: qty 1

## 2012-12-26 MED ORDER — SODIUM CHLORIDE 0.9 % IV SOLN: INTRAVENOUS | Status: DC

## 2012-12-26 MED ORDER — IOHEXOL 300 MG/ML  SOLN
100.0000 mL | Freq: Once | INTRAMUSCULAR | Status: AC | PRN
  Administered 2012-12-26: 100 mL via INTRAVENOUS

## 2012-12-26 MED ORDER — IOHEXOL 300 MG/ML  SOLN
50.0000 mL | Freq: Once | INTRAMUSCULAR | Status: AC | PRN
  Administered 2012-12-26: 50 mL via ORAL

## 2012-12-26 NOTE — ED Notes (Signed)
Pt c/o upper abd pain with nausea and dry heaves since 2am. See dr Sherwood Gambler this am and sent here. Received a shot there. Pt alert/oriented. nad at this time. States pain is 9. No s/s of pain at this time. Mm moist. Vomited x 1 in pcp office. Denies diarrhea. lnbm yesterday. Denies black or bloody stool/emesis.

## 2012-12-26 NOTE — ED Provider Notes (Signed)
CSN: [id number]     Arrival date & time 12/26/12  1006 History    This chart was scribed for Hurman Horn, MD,  by Ashley Jacobs, ED Scribe. The patient was seen in room APA10/APA10 and the patient's care was started at 11:03.     Chief Complaint  Patient presents with  . Abdominal Pain    HPI  HPI Comments: Ashley [name] is a 79 y.o. female who presents to the Emergency Department complaining of moderate diffuse upper epigastric abdominal pain that presented gradual onset several hours PTA. Pt mentions one episode of vomiting the morning with no blood present. Pt reports episodes of of dry heaving for the past week, with a "foamy" and "film" consistency that started 5 days PTA. With onset of symptoms the pt has experienced generalized weakness and fatigue. Pt had a normal BM yesterday and denies diarrhea. Pt reports hx of  bilateral chronic stable swelling for the past two years due to chronic stasis dermatitis both lower legs. Pt report chronic back pain and has a hx of back surguries and lymphedema.Pt reports that nothing relieves or worsens abdominal pain. Pt denies any other pain than the ones noted. No CP/SOB/fever/dysuria. No treatment PTA.Sent to ED to r/o SBO.  Past Medical History  Diagnosis Date  . Back pain   . Arthritis   . Hypertension   . Acid reflux   . Thyroid disease   . Edema   . Cancer   . Left-sided breast cancer 11/14/2011    Started Arimidex on 07/25/2009. Stage IB, grade 1, ER 99%, PR 100%, Her2 negative, left-sided breast cancer, 8 mm in size, S/P lumpectomy on 04/24/2010, and sentinel node biopsy.  Ki-67 marker 6%.  . Bilateral ovarian cysts 02/20/2012  . Osteoporosis 03/06/2012    Switched from Arimidex to Tamoxifen as a result of osteoporosis    Past Surgical History  Procedure Laterality Date  . Breast surgery    . Tonsillectomy    . Appendectomy    . Hernia repair    . Cholecystectomy    . Total knee revision    . Hip fracture surgery     History  reviewed. No pertinent family history. History  Substance Use Topics  . Smoking status: Never Smoker   . Smokeless tobacco: Never Used  . Alcohol Use: No   OB History   Grav Para Term Preterm Abortions TAB SAB Ect Mult Living                 Review of Systems  Constitutional: Negative for fever and chills.  HENT: Negative for trouble swallowing and voice change.   Gastrointestinal: Positive for nausea, vomiting and abdominal pain. Negative for diarrhea, constipation and blood in stool.  Genitourinary: Negative for hematuria and dyspareunia.  Musculoskeletal: Positive for back pain.  Skin: Positive for wound.       Chronic dermatitis  Neurological: Negative for dizziness, weakness, light-headedness and numbness.  Psychiatric/Behavioral: Negative for hallucinations. The patient is not nervous/anxious.   A complete 10 system review of systems was obtained and all systems are negative except as noted in the HPI and PMH.   Allergies  Ciprofloxacin; Codeine phosphate; Doxycycline; Erythromycin ethylsuccinate; Levofloxacin; Penicillins; Sulfamethoxazole; Tetracycline hcl; and Lidocaine  Home Medications   Current Outpatient Rx  Name  Route  Sig  Dispense  Refill  . ALPRAZolam (XANAX) 0.5 MG tablet   Oral   Take 0.5 mg by mouth 4 (four) times daily as needed for anxiety.         Marland Kitchen  anastrozole (ARIMIDEX) 1 MG tablet   Oral   Take 1 tablet (1 mg total) by mouth daily.   30 tablet   5   . furosemide (LASIX) 80 MG tablet   Oral   Take 80 mg by mouth 2 (two) times daily as needed for fluid.         Marland Kitchen HYDROcodone-acetaminophen (NORCO) 10-325 MG per tablet   Oral   Take 1 tablet by mouth every 6 (six) hours as needed for pain.         Marland Kitchen levothyroxine (SYNTHROID, LEVOTHROID) 75 MCG tablet   Oral   Take 75 mcg by mouth daily before breakfast.         . metoprolol succinate (TOPROL-XL) 50 MG 24 hr tablet   Oral   Take 50 mg by mouth daily. Take with or immediately  following a meal.         . nitroGLYCERIN (NITROSTAT) 0.4 MG SL tablet   Sublingual   Place 0.4 mg under the tongue every 5 (five) minutes as needed for chest pain.          Marland Kitchen omeprazole (PRILOSEC) 20 MG capsule   Oral   Take 20 mg by mouth 2 (two) times daily.         . potassium chloride SA (K-DUR,KLOR-CON) 20 MEQ tablet   Oral   Take 20 mEq by mouth daily.         . ondansetron (ZOFRAN ODT) 8 MG disintegrating tablet      8mg  ODT q4 hours prn nausea   4 tablet   0    BP 171/68  Pulse 53  Temp(Src) 98.7 F (37.1 C) (Oral)  Resp 18  Ht 5' (1.524 m)  Wt 138 lb (62.596 kg)  BMI 26.95 kg/m2  SpO2 97% Physical Exam  Nursing note and vitals reviewed. Constitutional:  Awake, alert, nontoxic appearance.  HENT:  Head: Atraumatic.  Eyes: Right eye exhibits no discharge. Left eye exhibits no discharge.  Neck: Neck supple.  Cardiovascular: Normal rate and regular rhythm.   No murmur heard. Pulmonary/Chest: Effort normal and breath sounds normal. No respiratory distress. She has no wheezes. She has no rales. She exhibits no tenderness.  Abdominal: Soft. Bowel sounds are normal. There is tenderness. There is no rebound.  Minimal diffused abdominal tenderness   Musculoskeletal: She exhibits no tenderness.  Baseline ROM, no obvious new focal weakness. Mild baseline tenderness  Dressings in place lower leg for chronic stable stasis dermatitis with edema  Neurological:  Mental status and motor strength appears baseline for patient and situation.  Skin: No rash noted.  Psychiatric: She has a normal mood and affect.    ED Course  DIAGNOSTIC STUDIES: Oxygen Saturation is 100% on room air, normal by my interpretation.   ECG: Sinus bradycardia, ventricular at 47, normal axis, right bundle branch block, diffuse T wave abnormality, no significant change noted compared with November 2010 COORDINATION OF CARE: 11:08 PM Patient / Family / Caregiver understand and agree with  initial ED impression and plan with expectations set for ED visit. Pt stable in ED with no significant deterioration in condition.Patient informed of clinical course, understand medical decision-making process, and agree with plan. Procedures (including critical care time)  Labs Reviewed  CBC - Abnormal; Notable for the following:    Platelets 104 (*)    All other components within normal limits  BASIC METABOLIC PANEL - Abnormal; Notable for the following:    Glucose, Bld 151 (*)  GFR calc non Af Amer 84 (*)    All other components within normal limits  URINALYSIS, ROUTINE W REFLEX MICROSCOPIC - Abnormal; Notable for the following:    APPearance CLOUDY (*)    Specific Gravity, Urine >1.030 (*)    Hgb urine dipstick TRACE (*)    Bilirubin Urine SMALL (*)    Ketones, ur TRACE (*)    Protein, ur TRACE (*)    All other components within normal limits  URINE MICROSCOPIC-ADD ON - Abnormal; Notable for the following:    Bacteria, UA FEW (*)    All other components within normal limits  LIPASE, BLOOD  TROPONIN I  HEPATIC FUNCTION PANEL   Ct Abdomen Pelvis W Contrast  12/26/2012   *RADIOLOGY REPORT*  Clinical Data: Diffuse abdominal pain.  CT ABDOMEN AND PELVIS WITH CONTRAST  Technique:  Multidetector CT imaging of the abdomen and pelvis was performed following the standard protocol during bolus administration of intravenous contrast.  Contrast: 50mL OMNIPAQUE IOHEXOL 300 MG/ML  SOLN, OMNIPAQUE IOHEXOL 300 MG/ML  SOLN  Comparison: CT 02/04/2012  Findings: Large hiatal hernia.  This includes much of the stomach and small bowel loops.  No evidence of obstruction.  Liver, spleen, pancreas, adrenals and kidneys are unremarkable.  Prior cholecystectomy.  Aorta and iliac vessels are normal caliber.  Bilateral ovarian cysts, stable. Large and small bowel are decompressed and grossly unremarkable.  Urinary bladder is unremarkable.  No free fluid, free air or adenopathy.  Aorta is normal caliber.   Compressive atelectasis in the left lower lobe related to the large hiatal hernia.  No confluent opacity on the right.  No pleural effusions.  Heart is normal size.  Tortuosity of the thoracic aorta.  No acute bony abnormality.  Severe compression fracture at L1 with associated kyphosis, stable.  IMPRESSION: Large hiatal hernia containing stomach and small bowel loops.  Stable bilateral ovarian cysts.   Original Report Authenticated By: Charlett Nose, M.D.   1. Abdominal pain     MDM  I doubt any other [facility] precluding discharge at this time including, but not necessarily limited to the following:SBI, SBO. I personally performed the services described in this documentation, which was scribed in my presence. The recorded information has been reviewed and is accurate.   Hurman Horn, MD 12/26/12 2003

## 2012-12-26 NOTE — ED Notes (Signed)
Patient assisted to use the bed pan to void. Patient is complaining that her back hurts really bad. Patient given heat pack inside pillowcase at this time. Asking why is it taking so long for Dr to come back in to see her.

## 2012-12-26 NOTE — ED Notes (Signed)
MD at bedside. 

## 2012-12-26 NOTE — ED Notes (Signed)
Patient yelling out for help instead of using her call button. Patient states that "thats stuff they gave me to drink is running me." She is dry heaving and spitting up a little.

## 2013-02-15 ENCOUNTER — Other Ambulatory Visit (HOSPITAL_COMMUNITY): Payer: Self-pay | Admitting: Internal Medicine

## 2013-02-15 DIAGNOSIS — I83009 Varicose veins of unspecified lower extremity with ulcer of unspecified site: Secondary | ICD-10-CM

## 2013-02-17 ENCOUNTER — Other Ambulatory Visit (HOSPITAL_COMMUNITY): Payer: Medicare Other

## 2013-02-22 ENCOUNTER — Ambulatory Visit (HOSPITAL_COMMUNITY): Payer: Medicare Other

## 2013-03-16 ENCOUNTER — Other Ambulatory Visit (HOSPITAL_COMMUNITY): Payer: Self-pay | Admitting: Internal Medicine

## 2013-03-16 DIAGNOSIS — Z9889 Other specified postprocedural states: Secondary | ICD-10-CM

## 2013-03-26 ENCOUNTER — Other Ambulatory Visit (HOSPITAL_COMMUNITY): Payer: Self-pay | Admitting: Oncology

## 2013-03-29 ENCOUNTER — Telehealth (HOSPITAL_COMMUNITY): Payer: Self-pay | Admitting: Oncology

## 2013-03-29 NOTE — Telephone Encounter (Addendum)
Ashley [name] called, message left to return call to clinic.  Reminding patient of December appointment and that Arimidex was called in for her.  Payton Doughty, RN  Message copied by Payton Doughty on Mon Mar 29, 2013  3:53 PM ------      Message from: Ellouise Newer      Created: Fri Mar 26, 2013 11:12 AM       Has not been seen in over 1 year.  Missed appt.  I refilled Arimidex #30 with 0 refills.   ------

## 2013-03-29 NOTE — Telephone Encounter (Deleted)
Ashley [name] called, message left to return call to clinic.  Reminding patient of December appointment and that Arimidex was called in for her.

## 2013-04-22 ENCOUNTER — Other Ambulatory Visit (HOSPITAL_COMMUNITY): Payer: Self-pay | Admitting: Oncology

## 2013-05-05 ENCOUNTER — Ambulatory Visit (HOSPITAL_COMMUNITY)
Admission: RE | Admit: 2013-05-05 | Discharge: 2013-05-05 | Disposition: A | Discharge status: Home / Self Care | Payer: Medicare Other | Source: Ambulatory Visit | Attending: Internal Medicine | Admitting: Internal Medicine

## 2013-05-05 DIAGNOSIS — Z853 Personal history of malignant neoplasm of breast: Secondary | ICD-10-CM | POA: Insufficient documentation

## 2013-05-05 DIAGNOSIS — Z9889 Other specified postprocedural states: Secondary | ICD-10-CM

## 2013-05-06 NOTE — Progress Notes (Signed)
Cassell Smiles., MD [address] [place] [address]  Breast cancer, left - Plan: CBC with Differential, Comprehensive metabolic panel, DG Bone Density  Osteoporosis - Plan: CBC with Differential, Comprehensive metabolic panel, DG Bone Density  CURRENT THERAPY:On Arimidex daily beginning in November 2010.  INTERVAL HISTORY: Ashley [name] 79 y.o. female returns for  regular  visit for followup of Stage IB, grade 1, ER +99%, PR +100%, HER-2/neu negative, left-sided breast cancer 8 mm in size status post lumpectomy and sentinel lymph node biopsy which was negative. Ki-67 marker was low 6% with surgery on 04/24/2009. She has been on Arimidex since then but has not been here for 2 years. She tolerates it without side effects.   I personally reviewed and went over radiographic studies with the patient.  Her mammogram on 05/05/2013 demonstrates a BIRADS 2.  She will be due next year for a follow-up screening exam.  We reviewed the NCCN guidelines for breast cancer surveillance.  NCCN guidelines recommends the following surveillance for invasive breast cancer:  A. History and Physical exam every 4-6 months for 5 years and then every 12 months.  B. Mammography every 12 months  C. Women on Tamoxifen: annual gynecologic assessment every 12 months if uterus is present.  D. Women on aromatase inhibitor or who experience ovarian failure secondary to treatment should have monitoring of bone health with a bone mineral density determination at baseline and periodically thereafter.  E. Assess and encourage adherence to adjuvant endocrine therapy.  F. Evidence suggests that active lifestyle and achieving and maintaining an ideal body weight (20-25 BMI) may lead to optimal breast cancer outcomes.  She continues to have issues with chronic LE edema with ulceration.  This is followed by the wound clinic at [facility] and she was encouraged to continue follow-up there.    She denies any oncologic complaints and ROS questioning is negative.     Past Medical History  Diagnosis Date  . Back pain   . Arthritis   . Hypertension   . Acid reflux   . Thyroid disease   . Edema   . Cancer   . Left-sided breast cancer 11/14/2011    Started Arimidex on 07/25/2009. Stage IB, grade 1, ER 99%, PR 100%, Her2 negative, left-sided breast cancer, 8 mm in size, S/P lumpectomy on 04/24/2010, and sentinel node biopsy.  Ki-67 marker 6%.  . Bilateral ovarian cysts 02/20/2012  . Osteoporosis 03/06/2012    Switched from Arimidex to Tamoxifen as a result of osteoporosis     has HYPOTHYROIDISM; HYPERTENSION; GERD; Left-sided breast cancer; Bilateral ovarian cysts; and Osteoporosis on her problem list.     is allergic to ciprofloxacin; codeine phosphate; doxycycline; erythromycin ethylsuccinate; levofloxacin; penicillins; sulfamethoxazole; tetracycline hcl; and lidocaine.  Ms. Lightcap does not currently have medications on file.  Past Surgical History  Procedure Laterality Date  . Breast surgery    . Tonsillectomy    . Appendectomy    . Hernia repair    . Cholecystectomy    . Total knee revision    . Hip fracture surgery      Denies any headaches, dizziness, double vision, fevers, chills, night sweats, nausea, vomiting, diarrhea, constipation, chest pain, heart palpitations, shortness of breath, blood in stool, black tarry stool, urinary pain, urinary burning, urinary frequency, hematuria.   PHYSICAL EXAMINATION  ECOG PERFORMANCE STATUS: 1 - Symptomatic but completely ambulatory  Filed Vitals:   05/07/13 1150  BP: 117/73  Pulse: 61  Temp: 97 F (36.1 C)  Resp: 16    GENERAL:alert, no distress, well nourished, well developed, comfortable, cooperative, obese and smiling, hunched with kyphosis.  SKIN: skin color, texture, turgor are normal, no rashes or significant lesions  HEAD: Normocephalic, No masses, lesions, tenderness or abnormalities  EYES: normal,  Conjunctiva are pink and non-injected  EARS: External ears normal  OROPHARYNX:lips, buccal mucosa, and tongue normal and mucous membranes are moist  NECK: supple, trachea midline  LYMPH: no palpable lymphadenopathy  BREAST:patient declined  LUNGS: clear to auscultation and percussion  HEART: regular rate & rhythm, 1/6 systolic ejection murmur, no gallops, S1 normal and S2 normal  ABDOMEN:abdomen soft, non-tender, normal bowel sounds BACK: kyphosis  EXTREMITIES:less then 2 second capillary refill, no skin discoloration, no clubbing, no cyanosis, LEs cleanly wrapped and not examined.  NEURO: alert & oriented x 3 with fluent speech, no focal motor/sensory deficits, gait normal    LABORATORY DATA: CBC    Component Value Date/Time   WBC 4.7 12/26/2012 1038   RBC 4.66 12/26/2012 1038   HGB 12.3 12/26/2012 1038   HCT 39.3 12/26/2012 1038   PLT 104* 12/26/2012 1038   MCV 84.3 12/26/2012 1038   MCH 26.4 12/26/2012 1038   MCHC 31.3 12/26/2012 1038   RDW 15.1 12/26/2012 1038   LYMPHSABS 2.2 01/20/2012 1500   MONOABS 0.5 01/20/2012 1500   EOSABS 0.1 01/20/2012 1500   BASOSABS 0.0 01/20/2012 1500      Chemistry      Component Value Date/Time   NA 136 12/26/2012 1038   K 3.5 12/26/2012 1038   CL 99 12/26/2012 1038   CO2 29 12/26/2012 1038   BUN 12 12/26/2012 1038   CREATININE 0.62 12/26/2012 1038      Component Value Date/Time   CALCIUM 10.1 12/26/2012 1038   ALKPHOS 81 12/26/2012 1156   AST 14 12/26/2012 1156   ALT 9 12/26/2012 1156   BILITOT 1.0 12/26/2012 1156        ASSESSMENT:  1. Stage IB, grade 1, ER +99%, PR +100%, HER-2/neu negative, left-sided breast cancer 8 mm in size status post lumpectomy and sentinel lymph node biopsy which was negative. Ki-67 marker was low 6% with surgery on 04/24/2009. She has been on Arimidex since then but has not been here for 2 years. She tolerates it without side effects. 2. Kyphosis and severe osteoporosis and she declined our intervention 3 years ago with Zometa but  supposedly has been taking calcium and vitamin D.  4. Ulcers on her legs secondary to what it appears to be venous insufficiency and she is seeing Dr. Jonnie Kind for this in [place]. She has both legs are wrapped very nicely today.  Patient Active Problem List   Diagnosis Date Noted  . Osteoporosis 03/06/2012  . Bilateral ovarian cysts 02/20/2012  . Left-sided breast cancer 11/14/2011  . HYPOTHYROIDISM 03/10/2007  . HYPERTENSION 03/10/2007  . GERD 03/10/2007     PLAN:  1. I personally reviewed and went over laboratory results with the patient. 2. I personally reviewed and went over radiographic studies with the patient. 3. Next screening breast mammogram is due in December 2015. 4. Labs today: CBC diff, CMET 5. We reviewed the NCCN guidelines pertaining to surveillance of invasive breast cancer. 6. Next bone density is due in October 2015.  Order placed. 7. She will complete 5 years worth of Arimidex in November 2015. 8. Return in 6 months for follow-up   THERAPY PLAN:  We will follow NCCN guidelines  for surveillance of breast cancer. NCCN guidelines recommends the following surveillance for invasive breast cancer:  A. History and Physical exam every 4-6 months for 5 years and then every 12 months.  B. Mammography every 12 months  C. Women on Tamoxifen: annual gynecologic assessment every 12 months if uterus is present.  D. Women on aromatase inhibitor or who experience ovarian failure secondary to treatment should have monitoring of bone health with a bone mineral density determination at baseline and periodically thereafter.  E. Assess and encourage adherence to adjuvant endocrine therapy.  F. Evidence suggests that active lifestyle and achieving and maintaining an ideal body weight (20-25 BMI) may lead to optimal breast cancer outcomes.   All questions were answered. The patient knows to call the clinic with any problems, questions or concerns. We can certainly see the patient  much sooner if necessary.  Patient and plan discussed with Dr. Alla German and he is in agreement with the aforementioned.   Ashley [name]

## 2013-05-07 ENCOUNTER — Encounter (HOSPITAL_COMMUNITY): Payer: Self-pay | Admitting: Oncology

## 2013-05-07 ENCOUNTER — Encounter (HOSPITAL_COMMUNITY): Payer: Medicare Other | Attending: Oncology | Admitting: Oncology

## 2013-05-07 VITALS — BP 117/73 | HR 61 | Temp 97.0°F | Resp 16 | Wt 134.2 lb

## 2013-05-07 DIAGNOSIS — C50919 Malignant neoplasm of unspecified site of unspecified female breast: Secondary | ICD-10-CM

## 2013-05-07 DIAGNOSIS — Z09 Encounter for follow-up examination after completed treatment for conditions other than malignant neoplasm: Secondary | ICD-10-CM | POA: Insufficient documentation

## 2013-05-07 DIAGNOSIS — M810 Age-related osteoporosis without current pathological fracture: Secondary | ICD-10-CM | POA: Insufficient documentation

## 2013-05-07 DIAGNOSIS — C50912 Malignant neoplasm of unspecified site of left female breast: Secondary | ICD-10-CM

## 2013-05-07 DIAGNOSIS — Z853 Personal history of malignant neoplasm of breast: Secondary | ICD-10-CM | POA: Insufficient documentation

## 2013-05-07 DIAGNOSIS — Z170 Estrogen receptor positive status [ER+]: Secondary | ICD-10-CM

## 2013-05-07 LAB — COMPREHENSIVE METABOLIC PANEL
ALT: 9 U/L (ref 0–35)
AST: 15 U/L (ref 0–37)
Alkaline Phosphatase: 70 U/L (ref 39–117)
CO2: 29 mEq/L (ref 19–32)
Calcium: 9.7 mg/dL (ref 8.4–10.5)
Chloride: 102 mEq/L (ref 96–112)
GFR calc Af Amer: 74 mL/min — ABNORMAL LOW (ref >90)
GFR calc non Af Amer: 63 mL/min — ABNORMAL LOW (ref >90)
Glucose, Bld: 95 mg/dL (ref 70–99)
Potassium: 4.7 mEq/L (ref 3.5–5.1)
Sodium: 139 mEq/L (ref 135–145)
Total Bilirubin: 0.8 mg/dL (ref 0.3–1.2)

## 2013-05-07 LAB — CBC WITH DIFFERENTIAL/PLATELET
Basophils Absolute: 0 10*3/uL (ref 0.0–0.1)
Lymphocytes Relative: 34 % (ref 12–46)
Lymphs Abs: 2.2 10*3/uL (ref 0.7–4.0)
MCV: 88.3 fL (ref 78.0–100.0)
Neutro Abs: 3.6 10*3/uL (ref 1.7–7.7)
Neutrophils Relative %: 56 % (ref 43–77)
Platelets: 135 10*3/uL — ABNORMAL LOW (ref 150–400)
RBC: 4.54 MIL/uL (ref 3.87–5.11)
RDW: 15.5 % (ref 11.5–15.5)
WBC: 6.4 10*3/uL (ref 4.0–10.5)

## 2013-05-07 NOTE — Progress Notes (Signed)
Ashley [name] Ashley [name]'s reason for visit today are for OV and labs as scheduled per MD orders.  Venipuncture performed with a 23 gauge butterfly needle to R Antecubital.  Ashley [name] tolerated venipuncture well and without incident; questions were answered and patient was discharged.

## 2013-05-07 NOTE — Patient Instructions (Signed)
[facility] Discharge Instructions  RECOMMENDATIONS MADE BY THE CONSULTANT AND ANY TEST RESULTS WILL BE SENT TO YOUR REFERRING PHYSICIAN.  EXAM FINDINGS BY THE PHYSICIAN TODAY AND SIGNS OR SYMPTOMS TO REPORT TO CLINIC OR PRIMARY PHYSICIAN: Exam and findings as discussed by T. Kefalas, PA-C.  INSTRUCTIONS/FOLLOW-UP: 1.  Return in 6 months for labs and office visit. 2.  Bone density in October 2015. 3.  Mammogram in December 2015.  Thank you for choosing [facility] to provide your oncology and hematology care.  To afford each patient quality time with our providers, please arrive at least 15 minutes before your scheduled appointment time.  With your help, our goal is to use those 15 minutes to complete the necessary work-up to ensure our physicians have the information they need to help with your evaluation and healthcare recommendations.    Effective January 1st, 2014, we ask that you re-schedule your appointment with our physicians should you arrive 10 or more minutes late for your appointment.  We strive to give you quality time with our providers, and arriving late affects you and other patients whose appointments are after yours.    Again, thank you for choosing [facility].  Our hope is that these requests will decrease the amount of time that you wait before being seen by our physicians.       _____________________________________________________________  Should you have questions after your visit to [facility], please contact our office at [phone] between the hours of 8:30 a.m. and 5:00 p.m.  Voicemails left after 4:30 p.m. will not be returned until the following business day.  For prescription refill requests, have your pharmacy contact our office with your prescription refill request.

## 2013-07-21 ENCOUNTER — Other Ambulatory Visit (HOSPITAL_COMMUNITY): Payer: Self-pay | Admitting: Oncology

## 2013-09-23 ENCOUNTER — Encounter: Payer: Self-pay | Admitting: Obstetrics & Gynecology

## 2013-09-23 ENCOUNTER — Other Ambulatory Visit (HOSPITAL_COMMUNITY)
Admission: RE | Admit: 2013-09-23 | Discharge: 2013-09-23 | Disposition: A | Discharge status: Home / Self Care | Payer: Medicare Other | Source: Ambulatory Visit | Attending: Obstetrics & Gynecology | Admitting: Obstetrics & Gynecology

## 2013-09-23 ENCOUNTER — Ambulatory Visit (INDEPENDENT_AMBULATORY_CARE_PROVIDER_SITE_OTHER): Payer: Medicare Other | Admitting: Obstetrics & Gynecology

## 2013-09-23 VITALS — BP 120/60 | Ht <= 58 in | Wt 134.0 lb

## 2013-09-23 DIAGNOSIS — Z01419 Encounter for gynecological examination (general) (routine) without abnormal findings: Secondary | ICD-10-CM

## 2013-09-23 DIAGNOSIS — Z124 Encounter for screening for malignant neoplasm of cervix: Secondary | ICD-10-CM | POA: Insufficient documentation

## 2013-09-23 NOTE — Addendum Note (Signed)
Addended by: Doyne Keel on: 09/23/2013 11:31 AM   Modules accepted: Orders

## 2013-09-23 NOTE — Progress Notes (Signed)
Patient ID: Ashley [name], female   DOB: [birth year], 80 y.o.   MRN: [id number] Subjective:     Ashley [name] is a 80 y.o. female here for a routine exam.  No LMP recorded. Patient is postmenopausal. No obstetric history on file. Birth Control Method:  none Menstrual Calendar(currently): na  Current complaints: phlebitis.   Current acute medical issues:  several   Recent Gynecologic History No LMP recorded. Patient is postmenopausal. Last Pap: 2012,  normal Last mammogram: 2014,  normal  Past Medical History  Diagnosis Date  . Back pain   . Arthritis   . Hypertension   . Acid reflux   . Thyroid disease   . Edema   . Cancer   . Left-sided breast cancer 11/14/2011    Started Arimidex on 07/25/2009. Stage IB, grade 1, ER 99%, PR 100%, Her2 negative, left-sided breast cancer, 8 mm in size, S/P lumpectomy on 04/24/2010, and sentinel node biopsy.  Ki-67 marker 6%.  . Bilateral ovarian cysts 02/20/2012  . Osteoporosis 03/06/2012    Switched from Arimidex to Tamoxifen as a result of osteoporosis     Past Surgical History  Procedure Laterality Date  . Breast surgery    . Tonsillectomy    . Appendectomy    . Hernia repair    . Cholecystectomy    . Total knee revision    . Hip fracture surgery      OB History   Grav Para Term Preterm Abortions TAB SAB Ect Mult Living                  History   Social History  . Marital Status: Divorced    Spouse Name: N/A    Number of Children: N/A  . Years of Education: N/A   Social History Main Topics  . Smoking status: Never Smoker   . Smokeless tobacco: Never Used  . Alcohol Use: No  . Drug Use: No  . Sexual Activity:    Other Topics Concern  . None   Social History Narrative  . None    History reviewed. No pertinent family history.   Review of Systems  Review of Systems  Constitutional: Negative for fever, chills, weight loss, malaise/fatigue and diaphoresis.  HENT: Negative for hearing loss, ear pain, nosebleeds,  congestion, sore throat, neck pain, tinnitus and ear discharge.   Eyes: Negative for blurred vision, double vision, photophobia, pain, discharge and redness.  Respiratory: Negative for cough, hemoptysis, sputum production, shortness of breath, wheezing and stridor.   Cardiovascular: Negative for chest pain, palpitations, orthopnea, claudication, leg swelling and PND.  Gastrointestinal: negative for abdominal pain. Negative for heartburn, nausea, vomiting, diarrhea, constipation, blood in stool and melena.  Genitourinary: Negative for dysuria, urgency, frequency, hematuria and flank pain.  Musculoskeletal: Negative for myalgias, back pain, joint pain and falls.  Skin: Negative for itching and rash.  Neurological: Negative for dizziness, tingling, tremors, sensory change, speech change, focal weakness, seizures, loss of consciousness, weakness and headaches.  Endo/Heme/Allergies: Negative for environmental allergies and polydipsia. Does not bruise/bleed easily.  Psychiatric/Behavioral: Negative for depression, suicidal ideas, hallucinations, memory loss and substance abuse. The patient is not nervous/anxious and does not have insomnia.        Objective:    Physical Exam  Vitals reviewed. Constitutional: She is oriented to person, place, and time. She appears well-developed and well-nourished.  HENT:  Head: Normocephalic and atraumatic.        Right Ear: External ear normal.  Left Ear: External  ear normal.  Nose: Nose normal.  Mouth/Throat: Oropharynx is clear and moist.  Eyes: Conjunctivae and EOM are normal. Pupils are equal, round, and reactive to light. Right eye exhibits no discharge. Left eye exhibits no discharge. No scleral icterus.  Neck: Normal range of motion. Neck supple. No tracheal deviation present. No thyromegaly present.  Cardiovascular: Normal rate, regular rhythm, normal heart sounds and intact distal pulses.  Exam reveals no gallop and no friction rub.   No murmur  heard. Respiratory: Effort normal and breath sounds normal. No respiratory distress. She has no wheezes. She has no rales. She exhibits no tenderness.  GI: Soft. Bowel sounds are normal. She exhibits no distension and no mass. There is no tenderness. There is no rebound and no guarding.  Genitourinary:  Breasts no masses skin changes or nipple changes bilaterally      Vulva is normal without lesions Vagina is pink moist without discharge Cervix normal in appearance and pap is done Uterus is normal size shape and contour Adnexa is negative with normal sized ovaries  Rectal    hemoccult negative, normal tone, no masses  Musculoskeletal: Normal range of motion. She exhibits no edema and no tenderness.  Neurological: She is alert and oriented to person, place, and time. She has normal reflexes. She displays normal reflexes. No cranial nerve deficit. She exhibits normal muscle tone. Coordination normal.  Skin: Skin is warm and dry. No rash noted. No erythema. No pallor.  Psychiatric: She has a normal mood and affect. Her behavior is normal. Judgment and thought content normal.       Assessment:    Healthy female exam.    Plan:    Follow up in: 1 year.

## 2013-10-22 ENCOUNTER — Other Ambulatory Visit (HOSPITAL_COMMUNITY): Payer: Self-pay | Admitting: Oncology

## 2013-11-04 ENCOUNTER — Ambulatory Visit (HOSPITAL_COMMUNITY): Payer: Medicare Other

## 2013-11-05 ENCOUNTER — Ambulatory Visit (HOSPITAL_COMMUNITY): Payer: Medicare Other

## 2013-11-23 ENCOUNTER — Encounter (HOSPITAL_COMMUNITY): Payer: Self-pay

## 2013-11-23 ENCOUNTER — Ambulatory Visit (HOSPITAL_COMMUNITY): Payer: Medicare Other

## 2013-11-23 ENCOUNTER — Encounter (HOSPITAL_COMMUNITY): Payer: Medicare Other | Attending: Hematology and Oncology

## 2013-11-23 ENCOUNTER — Encounter (HOSPITAL_BASED_OUTPATIENT_CLINIC_OR_DEPARTMENT_OTHER): Payer: Medicare Other

## 2013-11-23 VITALS — BP 133/71 | HR 60 | Temp 97.3°F | Resp 16 | Wt 128.7 lb

## 2013-11-23 DIAGNOSIS — Z170 Estrogen receptor positive status [ER+]: Secondary | ICD-10-CM

## 2013-11-23 DIAGNOSIS — M4000 Postural kyphosis, site unspecified: Secondary | ICD-10-CM | POA: Diagnosis not present

## 2013-11-23 DIAGNOSIS — M810 Age-related osteoporosis without current pathological fracture: Secondary | ICD-10-CM | POA: Insufficient documentation

## 2013-11-23 DIAGNOSIS — C50919 Malignant neoplasm of unspecified site of unspecified female breast: Secondary | ICD-10-CM

## 2013-11-23 DIAGNOSIS — C50912 Malignant neoplasm of unspecified site of left female breast: Secondary | ICD-10-CM

## 2013-11-23 DIAGNOSIS — Z79899 Other long term (current) drug therapy: Secondary | ICD-10-CM | POA: Insufficient documentation

## 2013-11-23 LAB — COMPREHENSIVE METABOLIC PANEL
ALK PHOS: 84 U/L (ref 39–117)
ALT: 11 U/L (ref 0–35)
AST: 20 U/L (ref 0–37)
Albumin: 3.7 g/dL (ref 3.5–5.2)
BILIRUBIN TOTAL: 0.8 mg/dL (ref 0.3–1.2)
BUN: 16 mg/dL (ref 6–23)
CALCIUM: 9.8 mg/dL (ref 8.4–10.5)
CHLORIDE: 103 meq/L (ref 96–112)
CO2: 29 meq/L (ref 19–32)
Creatinine, Ser: 0.85 mg/dL (ref 0.50–1.10)
GFR, EST AFRICAN AMERICAN: 74 mL/min — AB (ref >90)
GFR, EST NON AFRICAN AMERICAN: 63 mL/min — AB (ref >90)
GLUCOSE: 90 mg/dL (ref 70–99)
Potassium: 4.7 mEq/L (ref 3.7–5.3)
SODIUM: 141 meq/L (ref 137–147)
Total Protein: 7 g/dL (ref 6.0–8.3)

## 2013-11-23 LAB — CBC WITH DIFFERENTIAL/PLATELET
Basophils Absolute: 0 10*3/uL (ref 0.0–0.1)
Basophils Relative: 0 % (ref 0–1)
EOS PCT: 2 % (ref 0–5)
Eosinophils Absolute: 0.1 10*3/uL (ref 0.0–0.7)
HEMATOCRIT: 41.5 % (ref 36.0–46.0)
Hemoglobin: 13.1 g/dL (ref 12.0–15.0)
LYMPHS ABS: 1.9 10*3/uL (ref 0.7–4.0)
LYMPHS PCT: 29 % (ref 12–46)
MCH: 27.9 pg (ref 26.0–34.0)
MCHC: 31.6 g/dL (ref 30.0–36.0)
MCV: 88.5 fL (ref 78.0–100.0)
MONO ABS: 0.5 10*3/uL (ref 0.1–1.0)
Monocytes Relative: 7 % (ref 3–12)
NEUTROS ABS: 4.1 10*3/uL (ref 1.7–7.7)
Neutrophils Relative %: 62 % (ref 43–77)
PLATELETS: 130 10*3/uL — AB (ref 150–400)
RBC: 4.69 MIL/uL (ref 3.87–5.11)
RDW: 15.1 % (ref 11.5–15.5)
WBC: 6.6 10*3/uL (ref 4.0–10.5)

## 2013-11-23 NOTE — Progress Notes (Signed)
Labs drawn for cbcd, cmp, [place], [id number]

## 2013-11-23 NOTE — Progress Notes (Signed)
[facility]  OFFICE PROGRESS NOTE  [name]., MD [place] [address]  DIAGNOSIS: Breast cancer, left - Plan: CBC with Differential, Comprehensive metabolic panel, CEA, Cancer antigen 27.29, CBC with Differential, CEA, Comprehensive metabolic panel, Cancer antigen 27.29  Osteoporosis  Chief Complaint  Patient presents with  . Follow-up  . Left breast cancer    Anastrozole 1 mg daily    CURRENT THERAPY: Anastrozole beginning in November 2010  INTERVAL HISTORY: Ashley [name] 80 y.o. female returns for followup of Stage IB, grade 1, ER +99%, PR +100%, HER-2/neu negative, left-sided breast cancer 8 mm in size status post lumpectomy and sentinel lymph node biopsy which was negative. Ki-67 marker was low 6% with surgery on 04/24/2009. She has been taking anastrozole 1 mg daily since November of 2010 with plan to continue treatment through November of 2015 at least. She continues to do well wrapping both her lower extremities with pressure bandage is because of previous varicosities with ulceration. She denies any vaginal dryness, hot flashes, or worsening joint or bone pain. Appetite is good with regular bowel movements and no nausea, vomiting, diarrhea, constipation, melena, hematochezia, hematuria, incontinence, skin rash, headache, or seizures.   MEDICAL HISTORY: Past Medical History  Diagnosis Date  . Back pain   . Arthritis   . Hypertension   . Acid reflux   . Thyroid disease   . Edema   . Cancer   . Left-sided breast cancer 11/14/2011    Started Arimidex on 07/25/2009. Stage IB, grade 1, ER 99%, PR 100%, Her2 negative, left-sided breast cancer, 8 mm in size, S/P lumpectomy on 04/24/2010, and sentinel node biopsy.  Ki-67 marker 6%.  . Bilateral ovarian cysts 02/20/2012  . Osteoporosis 03/06/2012    Switched from Arimidex to Tamoxifen as a result of osteoporosis     INTERIM HISTORY: has HYPOTHYROIDISM;  HYPERTENSION; GERD; Left-sided breast cancer; Bilateral ovarian cysts; and Osteoporosis on her problem list.    ALLERGIES:  is allergic to ciprofloxacin; codeine phosphate; doxycycline; erythromycin ethylsuccinate; levofloxacin; penicillins; sulfamethoxazole; tetracycline hcl; and lidocaine.  MEDICATIONS: has a current medication list which includes the following prescription(s): alprazolam, anastrozole, furosemide, hydrocodone-acetaminophen, levothyroxine, metoprolol succinate, nitroglycerin, omeprazole, ondansetron, potassium chloride sa, and cephalexin.  SURGICAL HISTORY:  Past Surgical History  Procedure Laterality Date  . Breast surgery    . Tonsillectomy    . Appendectomy    . Hernia repair    . Cholecystectomy    . Total knee revision    . Hip fracture surgery      FAMILY HISTORY: family history is not on file.  SOCIAL HISTORY:  reports that she has never smoked. She has never used smokeless tobacco. She reports that she does not drink alcohol or use illicit drugs.  REVIEW OF SYSTEMS:  Other than that discussed above is noncontributory.  PHYSICAL EXAMINATION: ECOG PERFORMANCE STATUS: 1 - Symptomatic but completely ambulatory  Blood pressure 133/71, pulse 60, temperature 97.3 F (36.3 C), temperature source Oral, resp. rate 16, weight 128 lb 11.2 oz (58.378 kg).  GENERAL:alert, no distress and comfortable SKIN: skin color, texture, turgor are normal, no rashes or significant lesions EYES: PERLA; Conjunctiva are pink and non-injected, sclera clear SINUSES: No redness or tenderness over maxillary or ethmoid sinuses OROPHARYNX:no exudate, no erythema on lips, buccal mucosa, or tongue. NECK: supple, thyroid normal size, non-tender, without nodularity. No masses CHEST: Status post left lumpectomy with no masses in either breast. Increased  AP diameter due to kyphosis. LYMPH:  no palpable lymphadenopathy in the cervical, axillary or inguinal LUNGS: clear to auscultation and  percussion with normal breathing effort HEART: regular rate & rhythm . Grade 9-3/9 ejection systolic murmur at the lower left arm or that his nonradiating. No S3. ABDOMEN:abdomen soft, non-tender and normal bowel sounds MUSCULOSKELETAL:no cyanosis of digits and no clubbing. Range of motion normal. Severe kyphoscoliosis NEURO: alert & oriented x 3 with fluent speech. Hearing deficit.    LABORATORY DATA: Office Visit on 11/23/2013  Component Date Value Ref Range Status  . WBC 11/23/2013 6.6  4.0 - 10.5 K/uL Final  . RBC 11/23/2013 4.69  3.87 - 5.11 MIL/uL Final  . Hemoglobin 11/23/2013 13.1  12.0 - 15.0 g/dL Final  . HCT 11/23/2013 41.5  36.0 - 46.0 % Final  . MCV 11/23/2013 88.5  78.0 - 100.0 fL Final  . MCH 11/23/2013 27.9  26.0 - 34.0 pg Final  . MCHC 11/23/2013 31.6  30.0 - 36.0 g/dL Final  . RDW 11/23/2013 15.1  11.5 - 15.5 % Final  . Platelets 11/23/2013 130* 150 - 400 K/uL Final  . Neutrophils Relative % 11/23/2013 62  43 - 77 % Final  . Neutro Abs 11/23/2013 4.1  1.7 - 7.7 K/uL Final  . Lymphocytes Relative 11/23/2013 29  12 - 46 % Final  . Lymphs Abs 11/23/2013 1.9  0.7 - 4.0 K/uL Final  . Monocytes Relative 11/23/2013 7  3 - 12 % Final  . Monocytes Absolute 11/23/2013 0.5  0.1 - 1.0 K/uL Final  . Eosinophils Relative 11/23/2013 2  0 - 5 % Final  . Eosinophils Absolute 11/23/2013 0.1  0.0 - 0.7 K/uL Final  . Basophils Relative 11/23/2013 0  0 - 1 % Final  . Basophils Absolute 11/23/2013 0.0  0.0 - 0.1 K/uL Final    PATHOLOGY: No new pathology.  Urinalysis    Component Value Date/Time   COLORURINE YELLOW 12/26/2012 1100   APPEARANCEUR CLOUDY* 12/26/2012 1100   LABSPEC >1.030* 12/26/2012 1100   PHURINE 5.0 12/26/2012 1100   GLUCOSEU NEGATIVE 12/26/2012 1100   HGBUR TRACE* 12/26/2012 1100   BILIRUBINUR SMALL* 12/26/2012 1100   KETONESUR TRACE* 12/26/2012 1100   PROTEINUR TRACE* 12/26/2012 1100   UROBILINOGEN 0.2 12/26/2012 1100   NITRITE NEGATIVE 12/26/2012 [place]  12/26/2012 1100    RADIOGRAPHIC STUDIES: MM Digital Diagnostic Bilat Status: Final result            Study Result    CLINICAL DATA: History of left breast cancer status post lumpectomy  in 2010. History of 2 benign biopsies in the left breast in January  of 2013.  EXAM:  DIGITAL DIAGNOSTIC BILATERAL MAMMOGRAM WITH CAD  COMPARISON: With priors.  ACR Breast Density Category b: There are scattered areas of  fibroglandular density.  FINDINGS:  Stable lumpectomy changes are seen in the left breast. No suspicious  mass or malignant type microcalcifications identified in either  breast.  Mammographic images were processed with CAD.  IMPRESSION:  No evidence of malignancy in either breast.  RECOMMENDATION:  Bilateral diagnostic mammogram in 1 year is recommended.  I have discussed the findings and recommendations with the patient.  Results were also provided in writing at the conclusion of the  visit. If applicable, a reminder letter will be sent to the patient  regarding the next appointment.  BI-RADS CATEGORY 2: Benign Finding(s)  Electronically Signed  By: Lillia Mountain M.D.  On: 05/05/2013 08:47  ASSESSMENT:  1. Stage IB, grade 1, ER +99%, PR +100%, HER-2/neu negative, left-sided breast cancer 8 mm in size status post lumpectomy and sentinel lymph node biopsy which was negative. Ki-67 marker was low 6% with surgery on 04/24/2009. She has been on Arimidex since then but has not been here for 2 years. She tolerates it without side effects.  2. Kyphosis and severe osteoporosis and she declined our intervention 3 years ago with Zometa but supposedly has been taking calcium and vitamin D.    PLAN:  1. Continue anastrozole next visit in December of 2015. 2. Repeat mammogram in December 2015. 3. Continue lower extremity compression wraps. 4. Followup in December 2015 with CBC, chem profile, CEA, and CA 2729.   All questions were answered. The patient knows to call the  clinic with any problems, questions or concerns. We can certainly see the patient much sooner if necessary.   I spent 25 minutes counseling the patient face to face. The total time spent in the appointment was 30 minutes.    Doroteo Bradford, MD 11/23/2013 12:59 PM  DISCLAIMER:  This note was dictated with voice recognition software.  Similar sounding words can inadvertently be transcribed inaccurately and may not be corrected upon review.

## 2013-11-23 NOTE — Patient Instructions (Signed)
[place] Discharge Instructions  RECOMMENDATIONS MADE BY THE CONSULTANT AND ANY TEST RESULTS WILL BE SENT TO YOUR REFERRING PHYSICIAN.  EXAM FINDINGS BY THE PHYSICIAN TODAY AND SIGNS OR SYMPTOMS TO REPORT TO CLINIC OR PRIMARY PHYSICIAN: Exam and findings as discussed by Dr. Barnet Glasgow.  We will check labs today and if there is anything of concern we will contact you. Report any new lumps, bone pain, shortness of breath or other symptoms.  MEDICATIONS PRESCRIBED:  none  INSTRUCTIONS/FOLLOW-UP: Follow-up in December with blood work and office visit.  Thank you for choosing [place] to provide your oncology and hematology care.  To afford each patient quality time with our providers, please arrive at least 15 minutes before your scheduled appointment time.  With your help, our goal is to use those 15 minutes to complete the necessary work-up to ensure our physicians have the information they need to help with your evaluation and healthcare recommendations.    Effective January 1st, 2014, we ask that you re-schedule your appointment with our physicians should you arrive 10 or more minutes late for your appointment.  We strive to give you quality time with our providers, and arriving late affects you and other patients whose appointments are after yours.    Again, thank you for choosing [facility].  Our hope is that these requests will decrease the amount of time that you wait before being seen by our physicians.       _____________________________________________________________  Should you have questions after your visit to [facility], please contact our office at [phone] between the hours of 8:30 a.m. and 4:30 p.m.  Voicemails left after 4:30 p.m. will not be returned until the following business day.  For prescription refill requests, have your pharmacy contact our office with your prescription refill request.     _______________________________________________________________  We hope that we have given you very good care.  You may receive a patient satisfaction survey in the mail, please complete it and return it as soon as possible.  We value your feedback!  _______________________________________________________________  Have you asked about our STAR program?  STAR stands for Survivorship Training and Rehabilitation, and this is a nationally recognized cancer care program that focuses on survivorship and rehabilitation.  Cancer and cancer treatments may cause problems, such as, pain, making you feel tired and keeping you from doing the things that you need or want to do. Cancer rehabilitation can help. Our goal is to reduce these troubling effects and help you have the best quality of life possible.  You may receive a survey from a nurse that asks questions about your current state of health.  Based on the survey results, all eligible patients will be referred to the [facility] program for an evaluation so we can better serve you!  A frequently asked questions sheet is available upon request.

## 2013-11-24 LAB — CEA: CEA: 1.5 ng/mL (ref 0.0–5.0)

## 2013-11-24 LAB — CANCER ANTIGEN 27.29: CA 27.29: 13 U/mL (ref 0–39)

## 2013-11-24 NOTE — Progress Notes (Signed)
This encounter was created in error - please disregard.

## 2013-12-17 ENCOUNTER — Ambulatory Visit (HOSPITAL_COMMUNITY): Payer: Medicare Other

## 2014-02-22 ENCOUNTER — Other Ambulatory Visit (HOSPITAL_COMMUNITY): Payer: Self-pay | Admitting: Oncology

## 2014-02-22 DIAGNOSIS — C50912 Malignant neoplasm of unspecified site of left female breast: Secondary | ICD-10-CM

## 2014-02-22 MED ORDER — ANASTROZOLE 1 MG PO TABS
1.0000 mg | ORAL_TABLET | Freq: Every day | ORAL | Status: DC
Start: 2014-02-22 — End: 2014-06-01

## 2014-03-07 ENCOUNTER — Ambulatory Visit (HOSPITAL_COMMUNITY)
Admission: RE | Admit: 2014-03-07 | Discharge: 2014-03-07 | Disposition: A | Discharge status: Home / Self Care | Payer: Medicare Other | Source: Ambulatory Visit | Attending: Oncology | Admitting: Oncology

## 2014-03-07 DIAGNOSIS — C50912 Malignant neoplasm of unspecified site of left female breast: Secondary | ICD-10-CM

## 2014-03-07 DIAGNOSIS — Z853 Personal history of malignant neoplasm of breast: Secondary | ICD-10-CM | POA: Insufficient documentation

## 2014-03-07 DIAGNOSIS — M810 Age-related osteoporosis without current pathological fracture: Secondary | ICD-10-CM | POA: Diagnosis not present

## 2014-04-18 ENCOUNTER — Other Ambulatory Visit (HOSPITAL_COMMUNITY): Payer: Self-pay | Admitting: Internal Medicine

## 2014-04-18 DIAGNOSIS — Z1231 Encounter for screening mammogram for malignant neoplasm of breast: Secondary | ICD-10-CM

## 2014-04-18 DIAGNOSIS — Z853 Personal history of malignant neoplasm of breast: Secondary | ICD-10-CM

## 2014-05-10 ENCOUNTER — Ambulatory Visit (HOSPITAL_COMMUNITY)
Admission: RE | Admit: 2014-05-10 | Discharge: 2014-05-10 | Disposition: A | Discharge status: Home / Self Care | Payer: Medicare Other | Source: Ambulatory Visit | Attending: Internal Medicine | Admitting: Internal Medicine

## 2014-05-10 DIAGNOSIS — Z853 Personal history of malignant neoplasm of breast: Secondary | ICD-10-CM | POA: Diagnosis not present

## 2014-05-10 DIAGNOSIS — Z1231 Encounter for screening mammogram for malignant neoplasm of breast: Secondary | ICD-10-CM | POA: Insufficient documentation

## 2014-05-24 ENCOUNTER — Other Ambulatory Visit (HOSPITAL_COMMUNITY): Payer: Medicare Other

## 2014-05-24 NOTE — Progress Notes (Signed)
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 77m in size  Lumpectomy and sentinel lymph node biopsy 04/24/2009  Arimidex 04/2009  Severe Osteoporosis/Kyphosis with refusal of prior bisphosphonate therapy  DEXA 02/2014 osteoporosis  Mild pancytopenia  CURRENT THERAPY: Arimidex  INTERVAL HISTORY: Ashley STAVOLA89y.o. female returns for follow-up of her breast cancer. She is doing well. She is ready to finish her arimidex. She has osteoporosis but has always refused intervention. She could not tolerate calcium or vitamin D. She says it made her feel like she had "heart trouble."  MEDICAL HISTORY: Past Medical History  Diagnosis Date  . Back pain   . Arthritis   . Hypertension   . Acid reflux   . Thyroid disease   . Edema   . Cancer   . Left-sided breast cancer 11/14/2011    Started Arimidex on 07/25/2009. Stage IB, grade 1, ER 99%, PR 100%, Her2 negative, left-sided breast cancer, 8 mm in size, S/P lumpectomy on 04/24/2010, and sentinel node biopsy.  Ki-67 marker 6%.  . Bilateral ovarian cysts 02/20/2012  . Osteoporosis 03/06/2012    Switched from Arimidex to Tamoxifen as a result of osteoporosis     has HYPOTHYROIDISM; HYPERTENSION; GERD; Left-sided breast cancer; Bilateral ovarian cysts; Osteoporosis; and Other pancytopenia on her problem list.     No history exists.     is allergic to ciprofloxacin; codeine phosphate; doxycycline; erythromycin ethylsuccinate; levofloxacin; penicillins; sulfamethoxazole; tetracycline hcl; and lidocaine.  [name] no medications administered during this visit.  SURGICAL HISTORY: Past Surgical History  Procedure Laterality Date  . Breast surgery    . Tonsillectomy    . Appendectomy    . Hernia repair    . Cholecystectomy    . Total knee revision    . Hip fracture surgery      SOCIAL HISTORY: History   Social  History  . Marital Status: Divorced    Spouse Name: N/A    Number of Children: N/A  . Years of Education: N/A   Occupational History  . Not on file.   Social History Main Topics  . Smoking status: Never Smoker   . Smokeless tobacco: Never Used  . Alcohol Use: No  . Drug Use: No  . Sexual Activity: Not on file   Other Topics Concern  . Not on file   Social History Narrative  . No narrative on file    FAMILY HISTORY: No family history on file.  Review of Systems  Constitutional: Positive for malaise/fatigue. Negative for fever, chills, weight loss and diaphoresis.  HENT: Positive for hearing loss. Negative for congestion, ear discharge, ear pain, nosebleeds, sore throat and tinnitus.   Eyes: Negative for blurred vision, double vision, photophobia, pain, discharge and redness.       Wears glasses  Respiratory: Negative.  Negative for stridor.   Cardiovascular: Negative.        Has nitroglycerin but says it was to "calm her hiatal hernia"   Gastrointestinal: Negative.   Genitourinary: Positive for urgency and frequency. Negative for dysuria, hematuria and flank pain.  Musculoskeletal: Positive for back pain, joint pain and neck pain. Negative for myalgias and falls.       Uses "rubs" and heating pad  Skin: Negative.   Neurological: Negative.  Negative for weakness and headaches.  Endo/Heme/Allergies: Negative.   Psychiatric/Behavioral: Negative.     PHYSICAL  EXAMINATION  ECOG PERFORMANCE STATUS: 1 - Symptomatic but completely ambulatory  (Osteoporosis)  Filed Vitals:   05/25/14 1000  BP: 136/65  Pulse: 57  Temp: 97.6 F (36.4 C)  Resp: 18    Physical Exam  Constitutional: She is oriented to person, place, and time.  Pleasant well groomed elderly female. Severe kyphosis. Gets onto the exam table with assistance  HENT:  Head: Normocephalic and atraumatic.  Mouth/Throat: Oropharynx is clear and moist. No oropharyngeal exudate.  Eyes: Conjunctivae and EOM are  normal. Pupils are equal, round, and reactive to light. No scleral icterus.  Neck: Normal range of motion. Neck supple. No tracheal deviation present. No thyromegaly present.  Cardiovascular: Normal rate, regular rhythm and normal heart sounds.   Pulmonary/Chest: Effort normal and breath sounds normal. No respiratory distress. She has no wheezes. She has no rales. She exhibits no tenderness.  Breast exam performed, somewhat limited as she has difficulty lying flat. No suspicious lesions, masses nor skin changes  Abdominal: Soft. Bowel sounds are normal. She exhibits no distension and no mass. There is no tenderness. There is no rebound and no guarding.  Musculoskeletal: She exhibits no edema or tenderness.  Wearing bilateral TED hose to the thigh, changes c/w OA in the hands bilaterally  Lymphadenopathy:    She has no cervical adenopathy.  Neurological: She is alert and oriented to person, place, and time. No cranial nerve deficit. Coordination normal.  Skin: Skin is warm and dry. No rash noted. No erythema.  Psychiatric: Mood, memory, affect and judgment normal.    LABORATORY DATA:  CBC    Component Value Date/Time   WBC 5.2 05/25/2014 0935   RBC 4.29 05/25/2014 0935   HGB 11.7* 05/25/2014 0935   HCT 38.7 05/25/2014 0935   PLT 117* 05/25/2014 0935   MCV 90.2 05/25/2014 0935   MCH 27.3 05/25/2014 0935   MCHC 30.2 05/25/2014 0935   RDW 15.0 05/25/2014 0935   LYMPHSABS 1.8 05/25/2014 0935   MONOABS 0.5 05/25/2014 0935   EOSABS 0.2 05/25/2014 0935   BASOSABS 0.0 05/25/2014 0935   CMP     Component Value Date/Time   NA 139 05/25/2014 0935   K 4.5 05/25/2014 0935   CL 105 05/25/2014 0935   CO2 31 05/25/2014 0935   GLUCOSE 64* 05/25/2014 0935   BUN 17 05/25/2014 0935   CREATININE 0.87 05/25/2014 0935   CALCIUM 9.4 05/25/2014 0935   PROT 6.4 05/25/2014 0935   ALBUMIN 3.7 05/25/2014 0935   AST 17 05/25/2014 0935   ALT 12 05/25/2014 0935   ALKPHOS 71 05/25/2014 0935   BILITOT  0.9 05/25/2014 0935   GFRNONAA 61* 05/25/2014 0935   GFRAA 71* 05/25/2014 0935      ASSESSMENT and THERAPY PLAN:    Left-sided breast cancer Pleasant 80 year old female with a history of stage IB Er+, Pr+ and Her 2- breast cancer of the L breast. She has now completed 5 years of AI therapy.  I have advised her she can discontinue additional Arimidex once she has completed her current refill. She wants to continue therapy longer and we discussed this in detail.  Given her age, severe osteoporosis, early stage breast cancer with favorable features,  I feel it is appropriate for her to discontinue her therapy as per guidelines.  We will make sure she has an upcoming mammogram.  I will see her back in 6 months. If she is doing well we will move her out to yearly exams.  Osteoporosis  We discussed treatment of her osteoporosis, she has refused therapy in the past.  She does not take calcium, because it bothers her stomach.  She will continue to follow with her PCP.  I have advised her to call with any questions or problems prior to her follow-up.   Other pancytopenia She has a mild pancyopenia with anemia/thrombocytopenia.  She agrees with additional lab work in the next month or so.  I will order another CBC and basic labs, B12, folate etc with further recommendations to follow based upon results at that time.     All questions were answered. The patient knows to call the clinic with any problems, questions or concerns. We can certainly see the patient much sooner if necessary. Molli Hazard 05/29/2014

## 2014-05-25 ENCOUNTER — Encounter (HOSPITAL_BASED_OUTPATIENT_CLINIC_OR_DEPARTMENT_OTHER): Payer: Medicare Other

## 2014-05-25 ENCOUNTER — Encounter (HOSPITAL_COMMUNITY): Payer: Medicare Other | Attending: Hematology & Oncology | Admitting: Hematology & Oncology

## 2014-05-25 ENCOUNTER — Other Ambulatory Visit (HOSPITAL_COMMUNITY): Payer: Medicare Other

## 2014-05-25 VITALS — BP 136/65 | HR 57 | Temp 97.6°F | Resp 18 | Wt 130.0 lb

## 2014-05-25 DIAGNOSIS — Z853 Personal history of malignant neoplasm of breast: Secondary | ICD-10-CM

## 2014-05-25 DIAGNOSIS — D649 Anemia, unspecified: Secondary | ICD-10-CM | POA: Insufficient documentation

## 2014-05-25 DIAGNOSIS — C50912 Malignant neoplasm of unspecified site of left female breast: Secondary | ICD-10-CM | POA: Diagnosis not present

## 2014-05-25 DIAGNOSIS — D61818 Other pancytopenia: Secondary | ICD-10-CM | POA: Diagnosis not present

## 2014-05-25 DIAGNOSIS — M810 Age-related osteoporosis without current pathological fracture: Secondary | ICD-10-CM | POA: Insufficient documentation

## 2014-05-25 DIAGNOSIS — D696 Thrombocytopenia, unspecified: Secondary | ICD-10-CM

## 2014-05-25 LAB — CBC WITH DIFFERENTIAL/PLATELET
BASOS PCT: 1 % (ref 0–1)
Basophils Absolute: 0 10*3/uL (ref 0.0–0.1)
EOS ABS: 0.2 10*3/uL (ref 0.0–0.7)
EOS PCT: 4 % (ref 0–5)
HCT: 38.7 % (ref 36.0–46.0)
HEMOGLOBIN: 11.7 g/dL — AB (ref 12.0–15.0)
Lymphocytes Relative: 35 % (ref 12–46)
Lymphs Abs: 1.8 10*3/uL (ref 0.7–4.0)
MCH: 27.3 pg (ref 26.0–34.0)
MCHC: 30.2 g/dL (ref 30.0–36.0)
MCV: 90.2 fL (ref 78.0–100.0)
MONO ABS: 0.5 10*3/uL (ref 0.1–1.0)
MONOS PCT: 9 % (ref 3–12)
NEUTROS PCT: 52 % (ref 43–77)
Neutro Abs: 2.7 10*3/uL (ref 1.7–7.7)
Platelets: 117 10*3/uL — ABNORMAL LOW (ref 150–400)
RBC: 4.29 MIL/uL (ref 3.87–5.11)
RDW: 15 % (ref 11.5–15.5)
WBC: 5.2 10*3/uL (ref 4.0–10.5)

## 2014-05-25 LAB — COMPREHENSIVE METABOLIC PANEL
ALT: 12 U/L (ref 0–35)
AST: 17 U/L (ref 0–37)
Albumin: 3.7 g/dL (ref 3.5–5.2)
Alkaline Phosphatase: 71 U/L (ref 39–117)
Anion gap: 3 — ABNORMAL LOW (ref 5–15)
BILIRUBIN TOTAL: 0.9 mg/dL (ref 0.3–1.2)
BUN: 17 mg/dL (ref 6–23)
CHLORIDE: 105 meq/L (ref 96–112)
CO2: 31 mmol/L (ref 19–32)
CREATININE: 0.87 mg/dL (ref 0.50–1.10)
Calcium: 9.4 mg/dL (ref 8.4–10.5)
GFR calc Af Amer: 71 mL/min — ABNORMAL LOW (ref >90)
GFR, EST NON AFRICAN AMERICAN: 61 mL/min — AB (ref >90)
Glucose, Bld: 64 mg/dL — ABNORMAL LOW (ref 70–99)
Potassium: 4.5 mmol/L (ref 3.5–5.1)
SODIUM: 139 mmol/L (ref 135–145)
Total Protein: 6.4 g/dL (ref 6.0–8.3)

## 2014-05-25 NOTE — Progress Notes (Signed)
Lab draw

## 2014-05-25 NOTE — Patient Instructions (Signed)
[place] Discharge Instructions  RECOMMENDATIONS MADE BY THE CONSULTANT AND ANY TEST RESULTS WILL BE SENT TO YOUR REFERRING PHYSICIAN.  Please call before your next appointment if you have any problems. I am going to check blood work in 6 weeks. We will call you with the results. You can stop your cancer pill. It has been 5 years.  Thank you for choosing [place] to provide your oncology and hematology care.  To afford each patient quality time with our providers, please arrive at least 15 minutes before your scheduled appointment time.  With your help, our goal is to use those 15 minutes to complete the necessary work-up to ensure our physicians have the information they need to help with your evaluation and healthcare recommendations.    Effective January 1st, 2014, we ask that you re-schedule your appointment with our physicians should you arrive 10 or more minutes late for your appointment.  We strive to give you quality time with our providers, and arriving late affects you and other patients whose appointments are after yours.    Again, thank you for choosing [facility].  Our hope is that these requests will decrease the amount of time that you wait before being seen by our physicians.       _____________________________________________________________  Should you have questions after your visit to [facility], please contact our office at [phone] between the hours of 8:30 a.m. and 5:00 p.m.  Voicemails left after 4:30 p.m. will not be returned until the following business day.  For prescription refill requests, have your pharmacy contact our office with your prescription refill request.

## 2014-05-26 LAB — CANCER ANTIGEN 27.29: CA 27.29: 13 U/mL (ref 0–39)

## 2014-05-26 LAB — CEA: CEA: 1.2 ng/mL (ref 0.0–5.0)

## 2014-05-29 DIAGNOSIS — D61818 Other pancytopenia: Secondary | ICD-10-CM | POA: Insufficient documentation

## 2014-05-29 HISTORY — DX: Other pancytopenia: D61.818

## 2014-05-29 NOTE — Assessment & Plan Note (Signed)
Pleasant 81 year old female with a history of stage IB Er+, Pr+ and Her 2- breast cancer of the L breast. She has now completed 5 years of AI therapy.  I have advised her she can discontinue additional Arimidex once she has completed her current refill. She wants to continue therapy longer and we discussed this in detail.  Given her age, severe osteoporosis, early stage breast cancer with favorable features,  I feel it is appropriate for her to discontinue her therapy as per guidelines.  We will make sure she has an upcoming mammogram.  I will see her back in 6 months. If she is doing well we will move her out to yearly exams.

## 2014-05-29 NOTE — Assessment & Plan Note (Signed)
We discussed treatment of her osteoporosis, she has refused therapy in the past.  She does not take calcium, because it bothers her stomach.  She will continue to follow with her PCP.  I have advised her to call with any questions or problems prior to her follow-up.

## 2014-05-29 NOTE — Assessment & Plan Note (Signed)
She has a mild pancyopenia with anemia/thrombocytopenia.  She agrees with additional lab work in the next month or so.  I will order another CBC and basic labs, B12, folate etc with further recommendations to follow based upon results at that time.

## 2014-06-01 ENCOUNTER — Telehealth (HOSPITAL_COMMUNITY): Payer: Self-pay

## 2014-06-01 ENCOUNTER — Other Ambulatory Visit (HOSPITAL_COMMUNITY): Payer: Self-pay | Admitting: Oncology

## 2014-06-01 DIAGNOSIS — C50912 Malignant neoplasm of unspecified site of left female breast: Secondary | ICD-10-CM

## 2014-06-01 MED ORDER — ANASTROZOLE 1 MG PO TABS: 1.0000 mg | ORAL_TABLET | Freq: Every day | ORAL | Status: DC

## 2014-06-01 NOTE — Telephone Encounter (Signed)
Pt called stating that she noted a bruise on her chest area, blue in color. No drainage, fever or chills. She denies any falls. The bruise is getting better and she is now out of her anastrozole.  PA Kefalas notified. Called returned to the pt. Medication was refilled and to notify the clinic if bruising continues or she has any further complaints.

## 2014-07-06 ENCOUNTER — Encounter (HOSPITAL_COMMUNITY): Payer: Medicare HMO | Attending: Hematology & Oncology

## 2014-07-06 DIAGNOSIS — C50912 Malignant neoplasm of unspecified site of left female breast: Secondary | ICD-10-CM | POA: Insufficient documentation

## 2014-07-06 DIAGNOSIS — D649 Anemia, unspecified: Secondary | ICD-10-CM

## 2014-07-06 DIAGNOSIS — M810 Age-related osteoporosis without current pathological fracture: Secondary | ICD-10-CM | POA: Diagnosis not present

## 2014-07-06 DIAGNOSIS — D61818 Other pancytopenia: Secondary | ICD-10-CM | POA: Diagnosis not present

## 2014-07-06 LAB — CBC WITH DIFFERENTIAL/PLATELET
Basophils Absolute: 0 10*3/uL (ref 0.0–0.1)
Basophils Relative: 1 % (ref 0–1)
EOS PCT: 3 % (ref 0–5)
Eosinophils Absolute: 0.2 10*3/uL (ref 0.0–0.7)
HCT: 37 % (ref 36.0–46.0)
HEMOGLOBIN: 11.2 g/dL — AB (ref 12.0–15.0)
LYMPHS ABS: 2.3 10*3/uL (ref 0.7–4.0)
LYMPHS PCT: 31 % (ref 12–46)
MCH: 27.1 pg (ref 26.0–34.0)
MCHC: 30.3 g/dL (ref 30.0–36.0)
MCV: 89.6 fL (ref 78.0–100.0)
MONOS PCT: 7 % (ref 3–12)
Monocytes Absolute: 0.5 10*3/uL (ref 0.1–1.0)
Neutro Abs: 4.4 10*3/uL (ref 1.7–7.7)
Neutrophils Relative %: 59 % (ref 43–77)
PLATELETS: 139 10*3/uL — AB (ref 150–400)
RBC: 4.13 MIL/uL (ref 3.87–5.11)
RDW: 14.5 % (ref 11.5–15.5)
WBC: 7.4 10*3/uL (ref 4.0–10.5)

## 2014-07-06 LAB — RETICULOCYTES
RBC.: 3.27 MIL/uL — ABNORMAL LOW (ref 3.87–5.11)
RETIC CT PCT: 1.7 % (ref 0.4–3.1)
Retic Count, Absolute: 55.6 10*3/uL (ref 19.0–186.0)

## 2014-07-06 NOTE — Progress Notes (Signed)
LABS DRAWN

## 2014-07-07 LAB — IRON AND TIBC
Iron: 39 ug/dL — ABNORMAL LOW (ref 42–145)
SATURATION RATIOS: 10 % — AB (ref 20–55)
TIBC: 405 ug/dL (ref 250–470)
UIBC: 366 ug/dL (ref 125–400)

## 2014-07-07 LAB — FOLATE: Folate: 16 ng/mL

## 2014-07-07 LAB — VITAMIN B12: Vitamin B-12: 357 pg/mL (ref 211–911)

## 2014-07-07 LAB — FERRITIN: FERRITIN: 8 ng/mL — AB (ref 10–291)

## 2014-07-08 ENCOUNTER — Other Ambulatory Visit (HOSPITAL_COMMUNITY): Payer: Self-pay | Admitting: Hematology & Oncology

## 2014-07-08 DIAGNOSIS — D509 Iron deficiency anemia, unspecified: Secondary | ICD-10-CM | POA: Insufficient documentation

## 2014-07-12 ENCOUNTER — Ambulatory Visit (HOSPITAL_COMMUNITY): Payer: Medicare HMO

## 2014-07-14 ENCOUNTER — Encounter (HOSPITAL_COMMUNITY): Payer: Medicare HMO | Attending: Hematology & Oncology

## 2014-07-14 ENCOUNTER — Encounter (HOSPITAL_COMMUNITY): Payer: Self-pay

## 2014-07-14 DIAGNOSIS — M810 Age-related osteoporosis without current pathological fracture: Secondary | ICD-10-CM

## 2014-07-14 DIAGNOSIS — D649 Anemia, unspecified: Secondary | ICD-10-CM

## 2014-07-14 DIAGNOSIS — D509 Iron deficiency anemia, unspecified: Secondary | ICD-10-CM

## 2014-07-14 MED ORDER — SODIUM CHLORIDE 0.9 % IV SOLN
510.0000 mg | Freq: Once | INTRAVENOUS | Status: AC
  Administered 2014-07-14: 510 mg via INTRAVENOUS
  Filled 2014-07-14: qty 17

## 2014-07-14 MED ORDER — SODIUM CHLORIDE 0.9 % IV SOLN
Freq: Once | INTRAVENOUS | Status: AC
  Administered 2014-07-14: 14:00:00 via INTRAVENOUS

## 2014-07-14 NOTE — Progress Notes (Signed)
Tolerated well

## 2014-07-19 ENCOUNTER — Ambulatory Visit (HOSPITAL_COMMUNITY): Payer: Medicare HMO

## 2014-07-21 ENCOUNTER — Ambulatory Visit (HOSPITAL_COMMUNITY): Payer: Medicare HMO

## 2014-07-22 ENCOUNTER — Encounter (HOSPITAL_COMMUNITY): Payer: Self-pay

## 2014-07-22 ENCOUNTER — Encounter (HOSPITAL_BASED_OUTPATIENT_CLINIC_OR_DEPARTMENT_OTHER): Payer: Medicare HMO

## 2014-07-22 DIAGNOSIS — D509 Iron deficiency anemia, unspecified: Secondary | ICD-10-CM

## 2014-07-22 DIAGNOSIS — D649 Anemia, unspecified: Secondary | ICD-10-CM

## 2014-07-22 DIAGNOSIS — M810 Age-related osteoporosis without current pathological fracture: Secondary | ICD-10-CM

## 2014-07-22 MED ORDER — SODIUM CHLORIDE 0.9 % IV SOLN
Freq: Once | INTRAVENOUS | Status: AC
  Administered 2014-07-22: 14:00:00 via INTRAVENOUS

## 2014-07-22 MED ORDER — SODIUM CHLORIDE 0.9 % IV SOLN
510.0000 mg | Freq: Once | INTRAVENOUS | Status: AC
  Administered 2014-07-22: 510 mg via INTRAVENOUS
  Filled 2014-07-22: qty 17

## 2014-07-22 NOTE — Patient Instructions (Signed)
[place] at [facility]  Discharge Instructions:  You had an iron infusion today.  Please follow up as scheduled.  Call the clinic if you have any questions or concerns _______________________________________________________________  Thank you for choosing [place] at [facility] to provide your oncology and hematology care.  To afford each patient quality time with our providers, please arrive at least 15 minutes before your scheduled appointment.  You need to re-schedule your appointment if you arrive 10 or more minutes late.  We strive to give you quality time with our providers, and arriving late affects you and other patients whose appointments are after yours.  Also, if you no show three or more times for appointments you may be dismissed from the clinic.  Again, thank you for choosing [place] at [place] hope is that these requests will allow you access to exceptional care and in a timely manner. _______________________________________________________________  If you have questions after your visit, please contact our office at [phone] between the hours of 8:30 a.m. and 5:00 p.m. Voicemails left after 4:30 p.m. will not be returned until the following business day. _______________________________________________________________  For prescription refill requests, have your pharmacy contact our office. _______________________________________________________________  Recommendations made by the consultant and any test results will be sent to your referring physician. _______________________________________________________________

## 2014-07-22 NOTE — Progress Notes (Signed)
Ashley [name] Tolerated iron infusion well today. Discharged ambulatory

## 2014-09-26 ENCOUNTER — Encounter: Payer: Self-pay | Admitting: Obstetrics & Gynecology

## 2014-09-26 ENCOUNTER — Ambulatory Visit (INDEPENDENT_AMBULATORY_CARE_PROVIDER_SITE_OTHER): Payer: Medicare HMO | Admitting: Obstetrics & Gynecology

## 2014-09-26 VITALS — BP 136/86 | HR 72 | Ht <= 58 in | Wt 129.0 lb

## 2014-09-26 DIAGNOSIS — Z1212 Encounter for screening for malignant neoplasm of rectum: Secondary | ICD-10-CM | POA: Diagnosis not present

## 2014-09-26 DIAGNOSIS — Z1211 Encounter for screening for malignant neoplasm of colon: Secondary | ICD-10-CM

## 2014-09-26 DIAGNOSIS — Z01419 Encounter for gynecological examination (general) (routine) without abnormal findings: Secondary | ICD-10-CM | POA: Diagnosis not present

## 2014-09-26 MED ORDER — SILVER SULFADIAZINE 1 % EX CREA: TOPICAL_CREAM | CUTANEOUS | Status: DC

## 2014-09-26 NOTE — Progress Notes (Signed)
Patient ID: Ashley [name], female   DOB: 1931-08-21, 81 y.o.   MRN: [id number] Subjective:     Ashley [name] is a 81 y.o. female here for a routine exam.  No LMP recorded. Patient is postmenopausal. No obstetric history on file. Birth Control Method:  na Menstrual Calendar(currently): na  Current complaints: chronic venous stasis with skin breakdown.   Current acute medical issues:  As above   Recent Gynecologic History No LMP recorded. Patient is postmenopausal. Last Pap: several years,  normal Last mammogram: 2015,  normal  Past Medical History  Diagnosis Date  . Back pain   . Arthritis   . Hypertension   . Acid reflux   . Thyroid disease   . Edema   . Cancer   . Left-sided breast cancer 11/14/2011    Started Arimidex on 07/25/2009. Stage IB, grade 1, ER 99%, PR 100%, Her2 negative, left-sided breast cancer, 8 mm in size, S/P lumpectomy on 04/24/2010, and sentinel node biopsy.  Ki-67 marker 6%.  . Bilateral ovarian cysts 02/20/2012  . Osteoporosis 03/06/2012    Switched from Arimidex to Tamoxifen as a result of osteoporosis     Past Surgical History  Procedure Laterality Date  . Breast surgery    . Tonsillectomy    . Appendectomy    . Hernia repair    . Cholecystectomy    . Total knee revision    . Hip fracture surgery      OB History    No data available      History   Social History  . Marital Status: Divorced    Spouse Name: N/A  . Number of Children: N/A  . Years of Education: N/A   Social History Main Topics  . Smoking status: Never Smoker   . Smokeless tobacco: Never Used  . Alcohol Use: No  . Drug Use: No  . Sexual Activity: Not on file   Other Topics Concern  . None   Social History Narrative    No family history on file.   Current outpatient prescriptions:  .  ALPRAZolam (XANAX) 0.5 MG tablet, Take 0.5 mg by mouth 4 (four) times daily as needed for anxiety., Disp: , Rfl:  .  anastrozole (ARIMIDEX) 1 MG tablet, Take 1 tablet (1 mg  total) by mouth daily., Disp: 30 tablet, Rfl: 3 .  aspirin 81 MG tablet, Take 81 mg by mouth daily., Disp: , Rfl:  .  cephALEXin (KEFLEX) 250 MG capsule, Take by mouth 4 (four) times daily. Prn for ear ache, Disp: , Rfl:  .  furosemide (LASIX) 40 MG tablet, Take 40 mg by mouth daily. , Disp: , Rfl:  .  GARLIC 3888 PO, Take by mouth., Disp: , Rfl:  .  HYDROcodone-acetaminophen (NORCO) 10-325 MG per tablet, Take 1 tablet by mouth every 6 (six) hours as needed for pain., Disp: , Rfl:  .  levothyroxine (SYNTHROID, LEVOTHROID) 75 MCG tablet, Take 75 mcg by mouth daily before breakfast., Disp: , Rfl:  .  metoprolol succinate (TOPROL-XL) 50 MG 24 hr tablet, Take 50 mg by mouth daily. Take with or immediately following a meal., Disp: , Rfl:  .  nitroGLYCERIN (NITROSTAT) 0.4 MG SL tablet, Place 0.4 mg under the tongue every 5 (five) minutes as needed for chest pain. , Disp: , Rfl:  .  omeprazole (PRILOSEC) 20 MG capsule, Take 20 mg by mouth 2 (two) times daily., Disp: , Rfl:  .  potassium chloride SA (K-DUR,KLOR-CON) 20 MEQ tablet, Take  20 mEq by mouth daily., Disp: , Rfl:  .  ondansetron (ZOFRAN ODT) 8 MG disintegrating tablet, 18m ODT q4 hours prn nausea (Patient not taking: Reported on 09/26/2014), Disp: 4 tablet, Rfl: 0  Review of Systems  Review of Systems  Constitutional: Negative for fever, chills, weight loss, malaise/fatigue and diaphoresis.  HENT: Negative for hearing loss, ear pain, nosebleeds, congestion, sore throat, neck pain, tinnitus and ear discharge.   Eyes: Negative for blurred vision, double vision, photophobia, pain, discharge and redness.  Respiratory: Negative for cough, hemoptysis, sputum production, shortness of breath, wheezing and stridor.   Cardiovascular: Negative for chest pain, palpitations, orthopnea, claudication, leg swelling and PND.  Gastrointestinal: negative for abdominal pain. Negative for heartburn, nausea, vomiting, diarrhea, constipation, blood in stool and melena.   Genitourinary: Negative for dysuria, urgency, frequency, hematuria and flank pain.  Musculoskeletal: Negative for myalgias, back pain, joint pain and falls.  Skin: Negative for itching and rash.  Neurological: Negative for dizziness, tingling, tremors, sensory change, speech change, focal weakness, seizures, loss of consciousness, weakness and headaches.  Endo/Heme/Allergies: Negative for environmental allergies and polydipsia. Does not bruise/bleed easily.  Psychiatric/Behavioral: Negative for depression, suicidal ideas, hallucinations, memory loss and substance abuse. The patient is not nervous/anxious and does not have insomnia.        Objective:  Blood pressure 136/86, pulse 72, height _0  (1.397 m), weight 129 lb (58.514 kg).   Physical Exam  Vitals reviewed. Constitutional: She is oriented to person, place, and time. She appears well-developed and well-nourished.  HENT:  Head: Normocephalic and atraumatic.        Right Ear: External ear normal.  Left Ear: External ear normal.  Nose: Nose normal.  Mouth/Throat: Oropharynx is clear and moist.  Eyes: Conjunctivae and EOM are normal. Pupils are equal, round, and reactive to light. Right eye exhibits no discharge. Left eye exhibits no discharge. No scleral icterus.  Neck: Normal range of motion. Neck supple. No tracheal deviation present. No thyromegaly present.  Cardiovascular: Normal rate, regular rhythm, normal heart sounds and intact distal pulses.  Exam reveals no gallop and no friction rub.   No murmur heard. Respiratory: Effort normal and breath sounds normal. No respiratory distress. She has no wheezes. She has no rales. She exhibits no tenderness.  GI: Soft. Bowel sounds are normal. She exhibits no distension and no mass. There is no tenderness. There is no rebound and no guarding.  Genitourinary:  Breasts no masses skin changes or nipple changes bilaterally Severe Dowager's hump from osteoporotic fractures      Vulva is  normal without lesions Vagina is pink moist without discharge Cervix normal in appearance and pap is not done Uterus is normal size shape and contour Adnexa is negative with normal sized ovaries  {Rectal    hemoccult negative, normal tone, no masses  Musculoskeletal: Normal range of motion. She exhibits no edema and no tenderness.  Neurological: She is alert and oriented to person, place, and time. She has normal reflexes. She displays normal reflexes. No cranial nerve deficit. She exhibits normal muscle tone. Coordination normal.  Skin: Skin is warm and dry. No rash noted. No erythema. No pallor.  Psychiatric: She has a normal mood and affect. Her behavior is normal. Judgment and thought content normal.       Assessment:     Normal gyn exam .    Plan:    Follow up in: 3 weeks. silvadene cream tid

## 2014-10-12 ENCOUNTER — Other Ambulatory Visit (HOSPITAL_COMMUNITY): Payer: Self-pay | Admitting: Orthopaedic Surgery

## 2014-10-12 DIAGNOSIS — M25571 Pain in right ankle and joints of right foot: Secondary | ICD-10-CM

## 2014-10-13 ENCOUNTER — Telehealth: Payer: Self-pay | Admitting: *Deleted

## 2014-10-13 ENCOUNTER — Ambulatory Visit (HOSPITAL_COMMUNITY)
Admission: RE | Admit: 2014-10-13 | Discharge: 2014-10-13 | Disposition: A | Discharge status: Home / Self Care | Payer: Medicare HMO | Source: Ambulatory Visit | Attending: Orthopaedic Surgery | Admitting: Orthopaedic Surgery

## 2014-10-13 DIAGNOSIS — M25571 Pain in right ankle and joints of right foot: Secondary | ICD-10-CM | POA: Diagnosis not present

## 2014-10-13 DIAGNOSIS — G8929 Other chronic pain: Secondary | ICD-10-CM | POA: Insufficient documentation

## 2014-10-13 NOTE — Telephone Encounter (Signed)
Pt states unable to keep her appt for 10/17/2014 due to broke her leg. Pt states she will call our office back and r/s at a later date.

## 2014-10-17 ENCOUNTER — Ambulatory Visit: Payer: Medicare HMO | Admitting: Obstetrics & Gynecology

## 2014-10-28 ENCOUNTER — Other Ambulatory Visit (HOSPITAL_COMMUNITY): Payer: Self-pay | Admitting: Oncology

## 2014-11-24 ENCOUNTER — Encounter (HOSPITAL_BASED_OUTPATIENT_CLINIC_OR_DEPARTMENT_OTHER): Payer: Medicare HMO | Admitting: Hematology & Oncology

## 2014-11-24 ENCOUNTER — Ambulatory Visit (HOSPITAL_COMMUNITY): Payer: Medicare Other | Admitting: Hematology & Oncology

## 2014-11-24 ENCOUNTER — Encounter (HOSPITAL_COMMUNITY): Payer: Medicare HMO | Attending: Hematology & Oncology

## 2014-11-24 ENCOUNTER — Encounter (HOSPITAL_COMMUNITY): Payer: Self-pay | Admitting: Hematology & Oncology

## 2014-11-24 VITALS — BP 112/68 | HR 66 | Temp 97.6°F | Resp 22 | Wt 131.4 lb

## 2014-11-24 DIAGNOSIS — D61818 Other pancytopenia: Secondary | ICD-10-CM | POA: Diagnosis not present

## 2014-11-24 DIAGNOSIS — M810 Age-related osteoporosis without current pathological fracture: Secondary | ICD-10-CM

## 2014-11-24 DIAGNOSIS — C50912 Malignant neoplasm of unspecified site of left female breast: Secondary | ICD-10-CM | POA: Insufficient documentation

## 2014-11-24 DIAGNOSIS — Z853 Personal history of malignant neoplasm of breast: Secondary | ICD-10-CM

## 2014-11-24 DIAGNOSIS — E611 Iron deficiency: Secondary | ICD-10-CM | POA: Diagnosis not present

## 2014-11-24 DIAGNOSIS — D508 Other iron deficiency anemias: Secondary | ICD-10-CM | POA: Diagnosis not present

## 2014-11-24 DIAGNOSIS — D649 Anemia, unspecified: Secondary | ICD-10-CM

## 2014-11-24 LAB — COMPREHENSIVE METABOLIC PANEL
ALBUMIN: 3.9 g/dL (ref 3.5–5.0)
ALT: 11 U/L — AB (ref 14–54)
AST: 19 U/L (ref 15–41)
Alkaline Phosphatase: 75 U/L (ref 38–126)
Anion gap: 7 (ref 5–15)
BUN: 16 mg/dL (ref 6–20)
CALCIUM: 9.2 mg/dL (ref 8.9–10.3)
CO2: 30 mmol/L (ref 22–32)
Chloride: 102 mmol/L (ref 101–111)
Creatinine, Ser: 0.89 mg/dL (ref 0.44–1.00)
GFR calc Af Amer: >60 mL/min (ref >60)
GFR, EST NON AFRICAN AMERICAN: 60 mL/min — AB (ref >60)
GLUCOSE: 105 mg/dL — AB (ref 65–99)
Potassium: 4.1 mmol/L (ref 3.5–5.1)
SODIUM: 139 mmol/L (ref 135–145)
Total Bilirubin: 1.2 mg/dL (ref 0.3–1.2)
Total Protein: 6.8 g/dL (ref 6.5–8.1)

## 2014-11-24 LAB — CBC WITH DIFFERENTIAL/PLATELET
BASOS ABS: 0 10*3/uL (ref 0.0–0.1)
Basophils Relative: 0 % (ref 0–1)
EOS ABS: 0.2 10*3/uL (ref 0.0–0.7)
Eosinophils Relative: 4 % (ref 0–5)
HEMATOCRIT: 41.3 % (ref 36.0–46.0)
HEMOGLOBIN: 13.1 g/dL (ref 12.0–15.0)
LYMPHS ABS: 2 10*3/uL (ref 0.7–4.0)
Lymphocytes Relative: 35 % (ref 12–46)
MCH: 30.2 pg (ref 26.0–34.0)
MCHC: 31.7 g/dL (ref 30.0–36.0)
MCV: 95.2 fL (ref 78.0–100.0)
Monocytes Absolute: 0.4 10*3/uL (ref 0.1–1.0)
Monocytes Relative: 7 % (ref 3–12)
Neutro Abs: 3.1 10*3/uL (ref 1.7–7.7)
Neutrophils Relative %: 54 % (ref 43–77)
Platelets: 116 10*3/uL — ABNORMAL LOW (ref 150–400)
RBC: 4.34 MIL/uL (ref 3.87–5.11)
RDW: 13.1 % (ref 11.5–15.5)
WBC: 5.8 10*3/uL (ref 4.0–10.5)

## 2014-11-24 LAB — FERRITIN: Ferritin: 96 ng/mL (ref 11–307)

## 2014-11-24 NOTE — Patient Instructions (Signed)
..  [facility] Discharge Instructions  RECOMMENDATIONS MADE BY THE CONSULTANT AND ANY TEST RESULTS WILL BE SENT TO YOUR REFERRING PHYSICIAN.  Seen and discussion with Dr. Whitney Muse Call cancer center with any questions or concerns. Return in 6 months for follow up appt.     Thank you for choosing [place] [facility] to provide your oncology and hematology care.  To afford each patient quality time with our provider, please arrive at least 15 minutes before your scheduled appointment time.    You need to re-schedule your appointment should you arrive 10 or more minutes late.  We strive to give you quality time with our providers, and arriving late affects you and other patients whose appointments are after yours.  Also, if you no show three or more times for appointments you may be dismissed from the clinic at the providers discretion.     Again, thank you for choosing [facility].  Our hope is that these requests will decrease the amount of time that you wait before being seen by our physicians.       _____________________________________________________________  Should you have questions after your visit to [facility], please contact our office at [phone] between the hours of 8:30 a.m. and 4:30 p.m.  Voicemails left after 4:30 p.m. will not be returned until the following business day.  For prescription refill requests, have your pharmacy contact our office.

## 2014-11-24 NOTE — Progress Notes (Signed)
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 78m in size  Lumpectomy and sentinel lymph node biopsy 04/24/2009  Arimidex 04/2009  Severe Osteoporosis/Kyphosis with refusal of prior bisphosphonate therapy  DEXA 02/2014 osteoporosis  Mild pancytopenia  CURRENT THERAPY: Arimidex  INTERVAL HISTORY: [name]89y.o. female returns for follow-up of her breast cancer. She is doing well. She is ready to finish her arimidex. She has osteoporosis but has always refused intervention. She could not tolerate calcium or vitamin D. She says it made her feel like she had "heart trouble."   She is here alone today. Some nights she doesn't sleep as well due to her legs. She often dozes during the day. Some days she felt pretty good after the iron, while other days she couldn't tell a difference.  She has taken her breast cancer medication for 5 years and 7 months, she mentions she needs a refill.   Recently found that her heel was broken and had "evidence of old breaks". She had plantar fasciitis. She stubbed her toe on the already injured foot.   Oncologically she has no major complaints.  MEDICAL HISTORY: Past Medical History  Diagnosis Date   Back pain    Arthritis    Hypertension    Acid reflux    Thyroid disease    Edema    Cancer    Left-sided breast cancer 11/14/2011    Started Arimidex on 07/25/2009. Stage IB, grade 1, ER 99%, PR 100%, Her2 negative, left-sided breast cancer, 8 mm in size, S/P lumpectomy on 04/24/2010, and sentinel node biopsy.  Ki-67 marker 6%.   Bilateral ovarian cysts 02/20/2012   Osteoporosis 03/06/2012    Switched from Arimidex to Tamoxifen as a result of osteoporosis     has HYPOTHYROIDISM; HYPERTENSION; GERD; Left-sided breast cancer; Bilateral ovarian cysts; Osteoporosis; Other pancytopenia; and Iron deficiency anemia on her  problem list.     No history exists.     is allergic to ciprofloxacin; codeine phosphate; doxycycline; erythromycin ethylsuccinate; levofloxacin; penicillins; sulfamethoxazole; tetracycline hcl; and lidocaine.  Ms. SAlviarhad no medications administered during this visit.  SURGICAL HISTORY: Past Surgical History  Procedure Laterality Date   Breast surgery     Tonsillectomy     Appendectomy     Hernia repair     Cholecystectomy     Total knee revision     Hip fracture surgery      SOCIAL HISTORY: History   Social History   Marital Status: Divorced    Spouse Name: N/A   Number of Children: N/A   Years of Education: N/A   Occupational History   Not on file.   Social History Main Topics   Smoking status: Never Smoker    Smokeless tobacco: Never Used   Alcohol Use: No   Drug Use: No   Sexual Activity: Not on file   Other Topics Concern   Not on file   Social History Narrative    FAMILY HISTORY: History reviewed. No pertinent family history.  Review of Systems  Constitutional: Positive for malaise/fatigue. Negative for fever, chills, weight loss and diaphoresis.      Uses a cane. HENT: Positive for hearing loss. Negative for congestion, ear discharge, ear pain, nosebleeds, sore throat and tinnitus.   Eyes: Negative for blurred vision, double vision, photophobia, pain, discharge and redness.       Wears  glasses  Respiratory: Negative.  Negative for stridor.   Cardiovascular: Negative.        Has nitroglycerin but says it was to "calm her hiatal hernia"   Gastrointestinal: Negative.   Genitourinary: Positive for urgency and frequency. Negative for dysuria, hematuria and flank pain.  Musculoskeletal: Positive for back pain, joint pain and neck pain. Negative for myalgias and falls.       Uses "rubs" and heating pad  Skin: Negative.   Neurological: Negative.  Negative for weakness and headaches.  Endo/Heme/Allergies: Negative.     Psychiatric/Behavioral: Negative.     PHYSICAL EXAMINATION  ECOG PERFORMANCE STATUS: 1 - Symptomatic but completely ambulatory  (Osteoporosis)  Filed Vitals:   11/24/14 1219  BP: 112/68  Pulse: 66  Temp: 97.6 F (36.4 C)  Resp: 22   Physical Exam  Constitutional: She is oriented to person, place, and time.  Pleasant well groomed elderly female. Severe kyphosis. She is unable to get on the table even with assistance.  HENT:  Head: Normocephalic and atraumatic.  Mouth/Throat: Oropharynx is clear and moist. No oropharyngeal exudate.  Eyes: Conjunctivae and EOM are normal. Pupils are equal, round, and reactive to light. No scleral icterus.  Neck: Normal range of motion. Neck supple. No tracheal deviation present. No thyromegaly present.  Cardiovascular: Normal rate, regular rhythm and normal heart sounds.   Pulmonary/Chest: Effort normal and breath sounds normal. No respiratory distress. She has no wheezes. She has no rales. She exhibits no tenderness.  Abdominal: Soft. Bowel sounds are normal. She exhibits no distension and no mass. There is no tenderness. There is no rebound and no guarding.  Musculoskeletal: She exhibits no edema or tenderness.  Wearing bilateral TED hose to the thigh, changes c/w OA in the hands bilaterally  Lymphadenopathy:    She has no cervical adenopathy.  Neurological: She is alert and oriented to person, place, and time. No cranial nerve deficit. Coordination normal.  Skin: Skin is warm and dry. No rash noted. No erythema.  Psychiatric: Mood, memory, affect and judgment normal.    LABORATORY DATA:  CBC    Component Value Date/Time   WBC 7.4 07/06/2014 1233   WBC  07/06/2014 1233    PATIENT IDENTIFICATION ERROR. PLEASE DISREGARD RESULTS. ACCOUNT WILL BE CREDITED.   RBC 3.27* 07/06/2014 1233   RBC 4.13 07/06/2014 1233   RBC  07/06/2014 1233    PATIENT IDENTIFICATION ERROR. PLEASE DISREGARD RESULTS. ACCOUNT WILL BE CREDITED.   HGB 11.2* 07/06/2014  1233   HGB  07/06/2014 1233    PATIENT IDENTIFICATION ERROR. PLEASE DISREGARD RESULTS. ACCOUNT WILL BE CREDITED.   HCT 37.0 07/06/2014 1233   HCT  07/06/2014 1233    PATIENT IDENTIFICATION ERROR. PLEASE DISREGARD RESULTS. ACCOUNT WILL BE CREDITED.   PLT 139* 07/06/2014 1233   PLT  07/06/2014 1233    PATIENT IDENTIFICATION ERROR. PLEASE DISREGARD RESULTS. ACCOUNT WILL BE CREDITED.   MCV 89.6 07/06/2014 1233   MCV  07/06/2014 1233    PATIENT IDENTIFICATION ERROR. PLEASE DISREGARD RESULTS. ACCOUNT WILL BE CREDITED.   MCH 27.1 07/06/2014 1233   [place]  07/06/2014 1233    PATIENT IDENTIFICATION ERROR. PLEASE DISREGARD RESULTS. ACCOUNT WILL BE CREDITED.   MCHC 30.3 07/06/2014 1233   MCHC  07/06/2014 1233    PATIENT IDENTIFICATION ERROR. PLEASE DISREGARD RESULTS. ACCOUNT WILL BE CREDITED.   RDW 14.5 07/06/2014 1233   RDW  07/06/2014 1233    PATIENT IDENTIFICATION ERROR. PLEASE DISREGARD RESULTS. ACCOUNT WILL BE CREDITED.   LYMPHSABS 2.3  07/06/2014 1233   LYMPHSABS  07/06/2014 1233    PATIENT IDENTIFICATION ERROR. PLEASE DISREGARD RESULTS. ACCOUNT WILL BE CREDITED.   MONOABS 0.5 07/06/2014 1233   MONOABS  07/06/2014 1233    PATIENT IDENTIFICATION ERROR. PLEASE DISREGARD RESULTS. ACCOUNT WILL BE CREDITED.   EOSABS 0.2 07/06/2014 1233   EOSABS  07/06/2014 1233    PATIENT IDENTIFICATION ERROR. PLEASE DISREGARD RESULTS. ACCOUNT WILL BE CREDITED.   BASOSABS 0.0 07/06/2014 1233   BASOSABS  07/06/2014 1233    PATIENT IDENTIFICATION ERROR. PLEASE DISREGARD RESULTS. ACCOUNT WILL BE CREDITED.   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 52m in size Kyphosis, osteoporosis Iron deficiency with serum ferritin of 8 ng/ml February 2016  I have advised her to discontinue her AI therapy.  She is very reluctant but I have expressed my ongoing concern about her bone health. She has had another recent fracture. I have encouraged her to take calcium and vitamin D  We will order her next mammogram. She will have follow-up with [facility] 6 months.  . Advised her that iron levels are something to monitor and administer as needed. She wished to receive monthly IV iron. We discussed ongoing observation and replacement as needed. We have discussed GI evaluation, she wishes to wait on this. [place] she thinks in [place]. She does not wish to proceed with another currently.  I expressed my concerns about her severe iron deficiency.  Orders Placed This Encounter  Procedures   Ferritin   CBC with Differential    Standing Status: Future     Number of Occurrences:      Standing Expiration Date: 11/24/2015   Ferritin    Standing Status: Future     Number of Occurrences:      Standing Expiration Date: 11/24/2015   Comprehensive metabolic panel    Standing Status: Future     Number of Occurrences:      Standing Expiration Date: 11/24/2015    All questions were answered. The patient knows to call the clinic with any problems, questions or concerns. We can certainly see the patient much sooner if necessary.  This document serves as a record of services personally performed by SAncil Linsey MD. It was created on her behalf by EArlyce Harman a trained medical scribe. The creation of this record is based on the scribe's personal observations and the provider's statements to them. This document has been checked and approved by the  attending provider.  I have reviewed the above documentation for accuracy and completeness, and I agree with the above.  PMolli Hazard MD

## 2014-11-25 NOTE — Progress Notes (Signed)
LABS DRAWN

## 2015-02-02 ENCOUNTER — Telehealth (HOSPITAL_COMMUNITY): Payer: Self-pay | Admitting: *Deleted

## 2015-02-02 NOTE — Telephone Encounter (Signed)
Noted that Dr. Donald Pore note stated to discontinue arimidex on 11/24/2014. Patient is aware.

## 2015-02-02 NOTE — Telephone Encounter (Signed)
Patient has not taken arimidex since June appt. She was under the impression that she was told to stop it at that time. I do not see anything re: that in MD note. She also has had a broken heel (May I think) and broken toe and she believes she has hurt her back but has not seen a doctor about this. She also thinks that her hair is getting thinner.  Please advise re: arimidex instructions

## 2015-02-02 NOTE — Telephone Encounter (Signed)
Per Dr. Whitney Muse patient is not to take arimidex. It was d/ced on June 30th exam. I explained to her that if anything, discontinuation of drug will increase bone health. She verbalized understanding.

## 2015-03-13 ENCOUNTER — Ambulatory Visit: Payer: Medicare HMO | Admitting: Obstetrics & Gynecology

## 2015-03-14 ENCOUNTER — Encounter: Payer: Self-pay | Admitting: Obstetrics & Gynecology

## 2015-03-14 ENCOUNTER — Ambulatory Visit (INDEPENDENT_AMBULATORY_CARE_PROVIDER_SITE_OTHER): Payer: Medicare HMO | Admitting: Obstetrics & Gynecology

## 2015-03-14 VITALS — BP 130/80 | HR 92

## 2015-03-14 DIAGNOSIS — I872 Venous insufficiency (chronic) (peripheral): Secondary | ICD-10-CM

## 2015-03-14 DIAGNOSIS — I8312 Varicose veins of left lower extremity with inflammation: Secondary | ICD-10-CM | POA: Diagnosis not present

## 2015-03-14 DIAGNOSIS — M79605 Pain in left leg: Secondary | ICD-10-CM | POA: Diagnosis not present

## 2015-03-14 MED ORDER — GABAPENTIN 300 MG PO CAPS: 300.0000 mg | ORAL_CAPSULE | Freq: Three times a day (TID) | ORAL | Status: DC

## 2015-03-14 NOTE — Progress Notes (Signed)
Patient ID: Ashley [name], female   DOB: [birth year], 81 y.o.   MRN: [id number] Chief Complaint  Patient presents with  . check left leg    swollen.    Blood pressure 130/80, pulse 92.  81 y.o. No obstetric history on file. No LMP recorded. Patient is postmenopausal. The current method of family planning is menopause  Subjective Pt with chronic venous/lymphatic stasis of the left lower leg with cellulits Improved with wraps and silvadene cream local care No ulcers today  Silver water wraps may be of benefit  Objective Left leg with some inflammation associated with stasis No cellulitis  Pertinent ROS + pain in the left leg like fire or burning  Labs or studies     Impression Diagnoses this Encounter::   ICD-9-CM ICD-10-CM   1. Venous stasis dermatitis of left lower extremity 454.1 I83.12   2. Pain of left leg 729.5 M79.605     Established relevant diagnosis(es): See below  Plan/Recommendations: Meds ordered this encounter  Medications  . gabapentin (NEURONTIN) 300 MG capsule    Sig: Take 1 capsule (300 mg total) by mouth 3 (three) times daily.    Dispense:  90 capsule    Refill:  2    Labs or Scans Ordered: No orders of the defined types were placed in this encounter.      Follow up Return in about 6 weeks (around 04/25/2015).      Face to face time:  15 minutes  Greater than 50% of the visit time was spent in counseling and coordination of care with the patient.  The summary and outline of the counseling and care coordination is summarized in the note above.   All questions were answered.  Past Medical History  Diagnosis Date  . Back pain   . Arthritis   . Hypertension   . Acid reflux   . Thyroid disease   . Edema   . Cancer ([place])   . Left-sided breast cancer 11/14/2011    Started Arimidex on 07/25/2009. Stage IB, grade 1, ER 99%, PR 100%, Her2 negative, left-sided breast cancer, 8 mm in size, S/P lumpectomy on 04/24/2010, and sentinel node  biopsy.  Ki-67 marker 6%.  . Bilateral ovarian cysts 02/20/2012  . Osteoporosis 03/06/2012    Switched from Arimidex to Tamoxifen as a result of osteoporosis     Past Surgical History  Procedure Laterality Date  . Breast surgery    . Tonsillectomy    . Appendectomy    . Hernia repair    . Cholecystectomy    . Total knee revision    . Hip fracture surgery      OB History    No data available      Allergies  Allergen Reactions  . Ciprofloxacin Itching and Swelling  . Codeine Phosphate     REACTION: unspecified  . Doxycycline Itching and Swelling  . Erythromycin Ethylsuccinate     REACTION: unspecified  . Levofloxacin     REACTION: unspecified  . Penicillins     REACTION: unspecified  . Sulfamethoxazole     REACTION: unspecified  . Tetracycline Hcl     REACTION: unspecified  . Lidocaine     Social History   Social History  . Marital Status: Divorced    Spouse Name: N/A  . Number of Children: N/A  . Years of Education: N/A   Social History Main Topics  . Smoking status: Never Smoker   . Smokeless tobacco: Never Used  . Alcohol Use:  No  . Drug Use: No  . Sexual Activity: Not Asked   Other Topics Concern  . None   Social History Narrative    History reviewed. No pertinent family history.

## 2015-04-11 ENCOUNTER — Other Ambulatory Visit (HOSPITAL_COMMUNITY): Payer: Self-pay | Admitting: Internal Medicine

## 2015-04-11 DIAGNOSIS — Z9889 Other specified postprocedural states: Secondary | ICD-10-CM

## 2015-04-25 ENCOUNTER — Ambulatory Visit: Payer: Medicare HMO | Admitting: Obstetrics & Gynecology

## 2015-05-16 ENCOUNTER — Encounter (HOSPITAL_COMMUNITY): Payer: Medicare HMO

## 2015-05-16 ENCOUNTER — Ambulatory Visit (HOSPITAL_COMMUNITY)
Admission: RE | Admit: 2015-05-16 | Discharge: 2015-05-16 | Disposition: A | Discharge status: Home / Self Care | Payer: Medicare HMO | Source: Ambulatory Visit | Attending: Internal Medicine | Admitting: Internal Medicine

## 2015-05-16 DIAGNOSIS — Z853 Personal history of malignant neoplasm of breast: Secondary | ICD-10-CM | POA: Insufficient documentation

## 2015-05-16 DIAGNOSIS — Z9889 Other specified postprocedural states: Secondary | ICD-10-CM | POA: Diagnosis not present

## 2015-05-26 ENCOUNTER — Encounter (HOSPITAL_COMMUNITY): Payer: Self-pay | Admitting: Hematology & Oncology

## 2015-05-26 ENCOUNTER — Encounter (HOSPITAL_COMMUNITY): Payer: Medicare HMO | Attending: Hematology & Oncology | Admitting: Hematology & Oncology

## 2015-05-26 ENCOUNTER — Encounter (HOSPITAL_COMMUNITY): Payer: Medicare HMO

## 2015-05-26 VITALS — BP 120/67 | HR 57 | Temp 97.7°F | Resp 16 | Wt 117.1 lb

## 2015-05-26 DIAGNOSIS — E611 Iron deficiency: Secondary | ICD-10-CM

## 2015-05-26 DIAGNOSIS — C50912 Malignant neoplasm of unspecified site of left female breast: Secondary | ICD-10-CM

## 2015-05-26 DIAGNOSIS — R634 Abnormal weight loss: Secondary | ICD-10-CM | POA: Diagnosis not present

## 2015-05-26 DIAGNOSIS — D509 Iron deficiency anemia, unspecified: Secondary | ICD-10-CM | POA: Diagnosis not present

## 2015-05-26 DIAGNOSIS — M810 Age-related osteoporosis without current pathological fracture: Secondary | ICD-10-CM

## 2015-05-26 DIAGNOSIS — D508 Other iron deficiency anemias: Secondary | ICD-10-CM

## 2015-05-26 LAB — CBC WITH DIFFERENTIAL/PLATELET
BASOS ABS: 0 10*3/uL (ref 0.0–0.1)
BASOS PCT: 0 %
Eosinophils Absolute: 0.2 10*3/uL (ref 0.0–0.7)
Eosinophils Relative: 4 %
HEMATOCRIT: 41.5 % (ref 36.0–46.0)
Hemoglobin: 13 g/dL (ref 12.0–15.0)
Lymphocytes Relative: 26 %
Lymphs Abs: 1.4 10*3/uL (ref 0.7–4.0)
MCH: 29.7 pg (ref 26.0–34.0)
MCHC: 31.3 g/dL (ref 30.0–36.0)
MCV: 95 fL (ref 78.0–100.0)
MONOS PCT: 7 %
Monocytes Absolute: 0.4 10*3/uL (ref 0.1–1.0)
NEUTROS ABS: 3.2 10*3/uL (ref 1.7–7.7)
Neutrophils Relative %: 63 %
Platelets: 120 10*3/uL — ABNORMAL LOW (ref 150–400)
RBC: 4.37 MIL/uL (ref 3.87–5.11)
RDW: 13.5 % (ref 11.5–15.5)
WBC: 5.2 10*3/uL (ref 4.0–10.5)

## 2015-05-26 LAB — COMPREHENSIVE METABOLIC PANEL
ALK PHOS: 113 U/L (ref 38–126)
ALT: 10 U/L — AB (ref 14–54)
ANION GAP: 7 (ref 5–15)
AST: 17 U/L (ref 15–41)
Albumin: 3.8 g/dL (ref 3.5–5.0)
BUN: 16 mg/dL (ref 6–20)
CALCIUM: 9.6 mg/dL (ref 8.9–10.3)
CHLORIDE: 103 mmol/L (ref 101–111)
CO2: 29 mmol/L (ref 22–32)
CREATININE: 0.9 mg/dL (ref 0.44–1.00)
GFR, EST NON AFRICAN AMERICAN: 58 mL/min — AB (ref >60)
Glucose, Bld: 80 mg/dL (ref 65–99)
Potassium: 4.5 mmol/L (ref 3.5–5.1)
Sodium: 139 mmol/L (ref 135–145)
Total Bilirubin: 1 mg/dL (ref 0.3–1.2)
Total Protein: 6.5 g/dL (ref 6.5–8.1)

## 2015-05-26 LAB — FERRITIN: FERRITIN: 87 ng/mL (ref 11–307)

## 2015-05-26 NOTE — Patient Instructions (Addendum)
[place] at [facility] Discharge Instructions  RECOMMENDATIONS MADE BY THE CONSULTANT AND ANY TEST RESULTS WILL BE SENT TO YOUR REFERRING PHYSICIAN.    Exam completed by Dr Whitney Muse today. Your blood work is good. Hemoglobin is 13.0  Mammogram is good. Ferritin (iron)  level is still pending, we will call you with those results. If you find any new lumps or bumps please let us know. Prolia (injection twice a year) or alendronate (pill once a week) you can discuss these things, you call us see which one you want  Return to see the doctor in 6 months with labs. Please call the clinic if you have any questions or concerns.     Thank you for choosing [place] at [facility] to provide your oncology and hematology care.  To afford each patient quality time with our provider, please arrive at least 15 minutes before your scheduled appointment time.    You need to re-schedule your appointment should you arrive 10 or more minutes late.  We strive to give you quality time with our providers, and arriving late affects you and other patients whose appointments are after yours.  Also, if you no show three or more times for appointments you may be dismissed from the clinic at the providers discretion.     Again, thank you for choosing [facility].  Our hope is that these requests will decrease the amount of time that you wait before being seen by our physicians.       _____________________________________________________________  Should you have questions after your visit to [facility], please contact our office at [phone] between the hours of 8:30 a.m. and 4:30 p.m.  Voicemails left after 4:30 p.m. will not be returned until the following business day.  For prescription refill requests, have your pharmacy contact our office.      Denosumab injection What is this medicine? DENOSUMAB (den oh sue mab) slows bone  breakdown. Prolia is used to treat osteoporosis in women after menopause and in men. Delton See is used to prevent bone fractures and other bone problems caused by cancer bone metastases. Delton See is also used to treat giant cell tumor of the bone. This medicine may be used for other purposes; ask your health care provider or pharmacist if you have questions. What should I tell my health care provider before I take this medicine? They need to know if you have any of these conditions: -dental disease -eczema -infection or history of infections -kidney disease or on dialysis -low blood calcium or vitamin D -malabsorption syndrome -scheduled to have surgery or tooth extraction -taking medicine that contains denosumab -thyroid or parathyroid disease -an unusual reaction to denosumab, other medicines, foods, dyes, or preservatives -pregnant or trying to get pregnant -breast-feeding How should I use this medicine? This medicine is for injection under the skin. It is given by a health care professional in a hospital or clinic setting. If you are getting Prolia, a special MedGuide will be given to you by the pharmacist with each prescription and refill. Be sure to read this information carefully each time. For Prolia, talk to your pediatrician regarding the use of this medicine in children. Special care may be needed. For Delton See, talk to your pediatrician regarding the use of this medicine in children. While this drug may be prescribed for children as young as 13 years for selected conditions, precautions do apply. Overdosage: If you think you have  taken too much of this medicine contact a poison control center or emergency room at once. NOTE: This medicine is only for you. Do not share this medicine with others. What if I miss a dose? It is important not to miss your dose. Call your doctor or health care professional if you are unable to keep an appointment. What may interact with this medicine? Do not take  this medicine with any of the following medications: -other medicines containing denosumab This medicine may also interact with the following medications: -medicines that suppress the immune system -medicines that treat cancer -steroid medicines like prednisone or cortisone This list may not describe all possible interactions. Give your health care provider a list of all the medicines, herbs, non-prescription drugs, or dietary supplements you use. Also tell them if you smoke, drink alcohol, or use illegal drugs. Some items may interact with your medicine. What should I watch for while using this medicine? Visit your doctor or health care professional for regular checks on your progress. Your doctor or health care professional may order blood tests and other tests to see how you are doing. Call your doctor or health care professional if you get a cold or other infection while receiving this medicine. Do not treat yourself. This medicine may decrease your body's ability to fight infection. You should make sure you get enough calcium and vitamin D while you are taking this medicine, unless your doctor tells you not to. Discuss the foods you eat and the vitamins you take with your health care professional. See your dentist regularly. Brush and floss your teeth as directed. Before you have any dental work done, tell your dentist you are receiving this medicine. Do not become pregnant while taking this medicine or for 5 months after stopping it. Women should inform their doctor if they wish to become pregnant or think they might be pregnant. There is a potential for serious side effects to an unborn child. Talk to your health care professional or pharmacist for more information. What side effects may I notice from receiving this medicine? Side effects that you should report to your doctor or health care professional as soon as possible: -allergic reactions like skin rash, itching or hives, swelling of the  face, lips, or tongue -breathing problems -chest pain -fast, irregular heartbeat -feeling faint or lightheaded, falls -fever, chills, or any other sign of infection -muscle spasms, tightening, or twitches -numbness or tingling -skin blisters or bumps, or is dry, peels, or red -slow healing or unexplained pain in the mouth or jaw -unusual bleeding or bruising Side effects that usually do not require medical attention (Report these to your doctor or health care professional if they continue or are bothersome.): -muscle pain -stomach upset, gas This list may not describe all possible side effects. Call your doctor for medical advice about side effects. You may report side effects to FDA at 1-800-FDA-1088. Where should I keep my medicine? This medicine is only given in a clinic, doctor's office, or other health care setting and will not be stored at home. NOTE: This sheet is a summary. It may not cover all possible information. If you have questions about this medicine, talk to your doctor, pharmacist, or health care provider.    2016, Elsevier/Gold Standard. (2011-11-11 12:37:47)   Alendronate oral solution What is this medicine? ALENDRONATE (a LEN droe nate) slows calcium loss from bones. It helps to make normal healthy bone and to slow bone loss in people with Paget's disease and osteoporosis.  It may be used in others at risk for bone loss. This medicine may be used for other purposes; ask your health care provider or pharmacist if you have questions. What should I tell my health care provider before I take this medicine? They need to know if you have any of these conditions: -esophagus, stomach, or intestine problems, like acid reflux or GERD -dental disease -kidney disease -low blood calcium -low vitamin D -problems swallowing -problems sitting or standing for 30 minutes -an unusual or allergic reaction to alendronate, other medicines, foods, dyes, or preservatives -pregnant or  trying to get pregnant -breast-feeding How should I use this medicine? You must take this medicine exactly as directed or you will lower the amount of the medicine you absorb into your body or you may cause yourself harm. Take your dose by mouth first thing in the morning, after you are up for the day. Do not eat or drink anything before you take this solution. Use a specially marked spoon or container to measure the oral solution. Take the solution with 2 fluid ounces (1/4 cup) of plain water. Do not take the solution with any other drink. After taking this medicine do not eat breakfast, drink, or take any other medicines or vitamins for at least 30 minutes. Stand or sit up for at least 30 minutes after you take this medicine; do not lie down. Do not take your medicine more often than directed. Take this medicine on the same day every week. Talk to your pediatrician regarding the use of this medicine in children. Special care may be needed. Overdosage: If you think you have taken too much of this medicine contact a poison control center or emergency room at once. NOTE: This medicine is only for you. Do not share this medicine with others. What if I miss a dose? If you miss a dose, take the dose on the morning after you remember. Then take your next dose on your regular day of the week. Never take 2 doses on the same day. Do not take double or extra doses. What may interact with this medicine? -aluminum hydroxide -antacids -aspirin -calcium supplements -drugs for inflammation like ibuprofen, naproxen, and others -iron supplements -magnesium supplements -vitamins with minerals This list may not describe all possible interactions. Give your health care provider a list of all the medicines, herbs, non-prescription drugs, or dietary supplements you use. Also tell them if you smoke, drink alcohol, or use illegal drugs. Some items may interact with your medicine. What should I watch for while using this  medicine? Visit your doctor for regular check ups. It may be some time before you see benefit from this medicine. Do not stop taking your medicine unless your doctor tells you to. Your doctor or health care professional may order regular blood tests to see how you are doing. You should make sure you get enough calcium and vitamin D in your diet while you are taking this medicine, unless your doctor tells you not to. Discuss the foods you eat and the vitamins you take with your health care professional. Some people who take this medicine have severe bone, joint, and/or muscle pain. This medicine may also increase your risk for a broken thigh bone. Tell your doctor right away if you have pain in your upper leg or groin. Tell your doctor if you have any pain that does not go away or that gets worse. This medicine can make you more sensitive to the sun. If you get a  rash while taking this medicine, sunlight may cause the rash to get worse. Keep out of the sun. If you cannot avoid being in the sun, wear protective clothing and use sunscreen. Do not use sun lamps or tanning beds/booths. What side effects may I notice from receiving this medicine? Side effects that you should report to your doctor or health care professional as soon as possible: -allergic reactions like skin rash, itching or hives, swelling of the face, lips, or tongue -black or tarry stools -bone, muscle, or joint pain -changes in vision -chest pain -heartburn or stomach pain -jaw pain, especially after dental work -redness, blistering, peeling or loosening of the skin, including inside the mouth -trouble or pain when swallowing Side effects that usually do not require medical attention (report to your doctor or health care professional if they continue or are bothersome): -changes in taste -diarrhea or constipation -eye pain or itching -headache -nausea or vomiting -stomach gas or fullness This list may not describe all possible  side effects. Call your doctor for medical advice about side effects. You may report side effects to FDA at 1-800-FDA-1088. Where should I keep my medicine? Keep out of the reach of children. Store at room temperature of 15 and 30 degrees C (59 and 86 degrees F). Throw away any unused medicine after the expiration date. NOTE: This sheet is a summary. It may not cover all possible information. If you have questions about this medicine, talk to your doctor, pharmacist, or health care provider.    2016, Elsevier/Gold Standard. (2010-11-09 08:54:51)

## 2015-05-26 NOTE — Progress Notes (Signed)
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 67m in size  Lumpectomy and sentinel lymph node biopsy 04/24/2009  Arimidex 04/2009  Severe Osteoporosis/Kyphosis with refusal of prior bisphosphonate therapy  DEXA 02/2014 osteoporosis  Mild pancytopenia  CURRENT THERAPY: Observation  INTERVAL HISTORY: Ashley OLIVIER86y.o. female returns for follow-up of her breast cancer. She is doing well.  She has osteoporosis but has always refused intervention. She could not tolerate calcium or vitamin D. She says it made her feel like she had "heart trouble."  She is accompanied by a friend and presents in a wheelchair. She has two walkers at home. She uses two canes to move about while she is out of the home. She is doing well and eating "not quite like everybody wants her to". Her friend notes Ashley [name] lost weight. Denies any lumps or bumps.   Reports a recent arthritic attack during which she fell. A delivery man found her on the floor and helped her up. She did not break anything when she fell but did have a black eye. She did not "get checked out" after this episode. States she has experienced two major "arthritic" attacks as well as three smaller attacks. She does not have a LifeAlert and understands that one may be beneficial for her.   Weight is down 13 pounds since June.    MEDICAL HISTORY: Past Medical History  Diagnosis Date  . Back pain   . Arthritis   . Hypertension   . Acid reflux   . Thyroid disease   . Edema   . Cancer [place]   . Left-sided breast cancer 11/14/2011    Started Arimidex on 07/25/2009. Stage IB, grade 1, ER 99%, PR 100%, Her2 negative, left-sided breast cancer, 8 mm in size, S/P lumpectomy on 04/24/2010, and sentinel node biopsy.  Ki-67 marker 6%.  . Bilateral ovarian cysts 02/20/2012  . Osteoporosis 03/06/2012    Switched from Arimidex to  Tamoxifen as a result of osteoporosis   . At risk for falls     has HYPOTHYROIDISM; HYPERTENSION; GERD; Left-sided breast cancer; Bilateral ovarian cysts; Osteoporosis; Other pancytopenia ([place]; and Iron deficiency anemia on her problem list.     No history exists.     is allergic to ciprofloxacin; codeine phosphate; doxycycline; erythromycin ethylsuccinate; levofloxacin; penicillins; sulfamethoxazole; tetracycline hcl; and lidocaine.  Ashley [name] no medications administered during this visit.  SURGICAL HISTORY: Past Surgical History  Procedure Laterality Date  . Breast surgery    . Tonsillectomy    . Appendectomy    . Hernia repair    . Cholecystectomy    . Total knee revision    . Hip fracture surgery      SOCIAL HISTORY: Social History   Social History  . Marital Status: Divorced    Spouse Name: N/A  . Number of Children: N/A  . Years of Education: N/A   Occupational History  . Not on file.   Social History Main Topics  . Smoking status: Never Smoker   . Smokeless tobacco: Never Used  . Alcohol Use: No  . Drug Use: No  . Sexual Activity: Not on file   Other Topics Concern  . Not on file   Social History Narrative    FAMILY HISTORY: History reviewed. No pertinent family history.  Review of Systems  Constitutional: Positive for malaise/fatigue. Negative for fever,  chills, weight loss and diaphoresis.  HENT: Positive for hearing loss. Negative for congestion, ear discharge, ear pain, nosebleeds, sore throat and tinnitus.   Eyes: Negative for blurred vision, double vision, photophobia, pain, discharge and redness.  Respiratory: Negative.  Negative for stridor.   Cardiovascular: Negative.   Gastrointestinal: Negative.   Genitourinary: Negative for dysuria, hematuria and flank pain.  Musculoskeletal: Positive for joint pain and falls. Negative for myalgias.       Pain under her right arm.   Skin: Negative.   Neurological: Negative.  Negative for  weakness and headaches.  Endo/Heme/Allergies: Negative.   Psychiatric/Behavioral: Negative.     PHYSICAL EXAMINATION  ECOG PERFORMANCE STATUS: 1 - Symptomatic but completely ambulatory  (Osteoporosis)  Filed Vitals:   05/26/15 1255  BP: 120/67  Pulse: 57  Temp: 97.7 F (36.5 C)  Resp: 16    Physical Exam  Constitutional: She is oriented to person, place, and time.  Pleasant well groomed elderly female. Severe kyphosis. In wheelchair.  HENT:  Head: Normocephalic and atraumatic.  Mouth/Throat: Oropharynx is clear and moist. No oropharyngeal exudate.  Eyes: Conjunctivae and EOM are normal. Pupils are equal, round, and reactive to light. No scleral icterus.  Neck: Normal range of motion. Neck supple. No tracheal deviation present. No thyromegaly present.  Cardiovascular: Normal rate, regular rhythm and normal heart sounds.   Pulmonary/Chest: Effort normal and breath sounds normal. No respiratory distress. She has no wheezes. She has no rales. She exhibits no tenderness.  Pain upon palpation towards the right scapula  Abdominal: Soft. Bowel sounds are normal. She exhibits no distension and no mass. There is no tenderness. There is no rebound and no guarding.  Musculoskeletal: She exhibits no edema or tenderness.  Wearing bilateral TED hose to the thigh  Lymphadenopathy:    She has no cervical adenopathy.  Neurological: She is alert and oriented to person, place, and time. No cranial nerve deficit. Coordination normal.  Skin: Skin is warm and dry. No rash noted. No erythema.  Psychiatric: Mood, memory, affect and judgment normal.    LABORATORY DATA: I have reviewed the data as listed. CBC    Component Value Date/Time   WBC 5.2 05/26/2015 1237   RBC 4.37 05/26/2015 1237   RBC 3.27* 07/06/2014 1233   HGB 13.0 05/26/2015 1237   HCT 41.5 05/26/2015 1237   PLT 120* 05/26/2015 1237   MCV 95.0 05/26/2015 1237   MCH 29.7 05/26/2015 1237   MCHC 31.3 05/26/2015 1237   RDW 13.5  05/26/2015 1237   LYMPHSABS 1.4 05/26/2015 1237   MONOABS 0.4 05/26/2015 1237   EOSABS 0.2 05/26/2015 1237   BASOSABS 0.0 05/26/2015 1237   CMP     Component Value Date/Time   NA 139 05/26/2015 1237   K 4.5 05/26/2015 1237   CL 103 05/26/2015 1237   CO2 29 05/26/2015 1237   GLUCOSE 80 05/26/2015 1237   BUN 16 05/26/2015 1237   CREATININE 0.90 05/26/2015 1237   CALCIUM 9.6 05/26/2015 1237   PROT 6.5 05/26/2015 1237   ALBUMIN 3.8 05/26/2015 1237   AST 17 05/26/2015 1237   ALT 10* 05/26/2015 1237   ALKPHOS 113 05/26/2015 1237   BILITOT 1.0 05/26/2015 1237   GFRNONAA 58* 05/26/2015 1237   GFRAA >60 05/26/2015 1237      ASSESSMENT and THERAPY PLAN:  Stage IB, grade 1, ER+ 99%, PR+ 100%, Her-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 77m in size Kyphosis, osteoporosis Iron deficiency with serum ferritin of 8  ng/ml February 2016 Thrombocytopenia  Anastazia is not willing to pursue physical therapy. She is also not willing to pursue GI evaluation, we treated her last year for severe iron deficiency.  I discussed her osteoporosis and falls again, she was given information on bisphosphonates and prolia. She does not take calcium and vitamin D.  Advised for the patient to get a LifeAlert due to her recent fall. She was unable to get up until a delivery man came to her home.  I discussed my concerns for her living alone in regards to caring for herself in general. She wishes to continue to live alone.  She will return for follow-up in 6 months.   Orders Placed This Encounter  Procedures  . CBC with Differential    Standing Status: Future     Number of Occurrences:      Standing Expiration Date: 05/25/2016  . Comprehensive metabolic panel    Standing Status: Future     Number of Occurrences:      Standing Expiration Date: 05/25/2016  . Ferritin    Standing Status: Future     Number of Occurrences:      Standing Expiration Date: 05/25/2016   All questions were answered. The  patient knows to call the clinic with any problems, questions or concerns. We can certainly see the patient much sooner if necessary.  This document serves as a record of services personally performed by Ancil Linsey, MD. It was created on her behalf by Arlyce Harman, a trained medical scribe. The creation of this record is based on the scribe's personal observations and the provider's statements to them. This document has been checked and approved by the attending provider.  I have reviewed the above documentation for accuracy and completeness, and I agree with the above.  Molli Hazard, MD  05/26/2015

## 2015-05-30 ENCOUNTER — Telehealth (HOSPITAL_COMMUNITY): Payer: Self-pay

## 2015-05-30 NOTE — Telephone Encounter (Signed)
-----   Message from Epifanio Lesches sent at 05/30/2015 11:06 AM EST ----- Contact: [phone] Would like her lab results

## 2015-05-30 NOTE — Telephone Encounter (Signed)
Labs discussed and patient aware that if MD has any other recommendations related to her labs, she will be contacted.

## 2015-06-26 DIAGNOSIS — R600 Localized edema: Secondary | ICD-10-CM | POA: Diagnosis not present

## 2015-06-26 DIAGNOSIS — I8312 Varicose veins of left lower extremity with inflammation: Secondary | ICD-10-CM | POA: Diagnosis not present

## 2015-06-26 DIAGNOSIS — M791 Myalgia: Secondary | ICD-10-CM | POA: Diagnosis not present

## 2015-06-26 DIAGNOSIS — Z1389 Encounter for screening for other disorder: Secondary | ICD-10-CM | POA: Diagnosis not present

## 2015-06-26 DIAGNOSIS — Z6822 Body mass index (BMI) 22.0-22.9, adult: Secondary | ICD-10-CM | POA: Diagnosis not present

## 2015-07-01 ENCOUNTER — Encounter (HOSPITAL_COMMUNITY): Payer: Self-pay | Admitting: Hematology & Oncology

## 2015-07-14 ENCOUNTER — Telehealth: Payer: Self-pay | Admitting: Obstetrics & Gynecology

## 2015-07-18 ENCOUNTER — Ambulatory Visit (INDEPENDENT_AMBULATORY_CARE_PROVIDER_SITE_OTHER): Payer: Medicare Other | Admitting: Obstetrics & Gynecology

## 2015-07-18 ENCOUNTER — Encounter: Payer: Self-pay | Admitting: Obstetrics & Gynecology

## 2015-07-18 VITALS — BP 120/70 | HR 76 | Wt 109.0 lb

## 2015-07-18 DIAGNOSIS — I8312 Varicose veins of left lower extremity with inflammation: Secondary | ICD-10-CM

## 2015-07-18 DIAGNOSIS — L03119 Cellulitis of unspecified part of limb: Secondary | ICD-10-CM | POA: Diagnosis not present

## 2015-07-18 NOTE — Progress Notes (Signed)
Patient ID: Ashley [name], female   DOB: [birth year], 82 y.o.   MRN: [id number]      Chief Complaint  Patient presents with  . gyn visit    soreness in chest/ check legs    Blood pressure 120/70, pulse 76, weight 109 lb (49.442 kg).  82 y.o. No obstetric history on file. No LMP recorded. Patient is postmenopausal. The current method of family planning is post menopausal status.  Subjective Long history of venous insufficiency in both lower legs, R worse than left both on compression Have gone over local care   Objective Small amopunt cellulitis  Pertinent ROS   Labs or studies     Impression Diagnoses this Encounter::   ICD-9-CM ICD-10-CM   1. Cellulitis of lower leg 682.6 L03.119     Established relevant diagnosis(es):   Plan/Recommendations: No orders of the defined types were placed in this encounter.    Labs or Scans Ordered: No orders of the defined types were placed in this encounter.    Management:: Will see next week and use silver water wraps as well as silvadene  Follow up  Return in about 1 week (around 07/25/2015) for Follow up, with Dr Elonda Husky.        Face to face time:  15 minutes  Greater than 50% of the visit time was spent in counseling and coordination of care with the patient.  The summary and outline of the counseling and care coordination is summarized in the note above.   All questions were answered.

## 2015-07-26 ENCOUNTER — Ambulatory Visit (INDEPENDENT_AMBULATORY_CARE_PROVIDER_SITE_OTHER): Payer: Medicare Other | Admitting: Obstetrics & Gynecology

## 2015-07-26 VITALS — BP 140/80 | HR 80 | Wt 108.0 lb

## 2015-07-26 DIAGNOSIS — L03119 Cellulitis of unspecified part of limb: Secondary | ICD-10-CM

## 2015-07-27 NOTE — Progress Notes (Signed)
Patient ID: Ashley [name], female   DOB: 1932/05/16, 82 y.o.   MRN: [id number] Chief Complaint  Patient presents with  . Follow-up    Blood pressure 140/80, pulse 80, weight 108 lb (48.988 kg).  In to recheck right leg superficial cellulits due to poor circulation chronic venous insufficiency  Given local care instructions Decrease stockings to 10-15 mm Hg  Silver water + silvadene     Face to face time:  10 minutes  Greater than 50% of the visit time was spent in counseling and coordination of care with the patient.  The summary and outline of the counseling and care coordination is summarized in the note above.   All questions were answered.

## 2015-08-16 DIAGNOSIS — I83008 Varicose veins of unspecified lower extremity with ulcer other part of lower leg: Secondary | ICD-10-CM | POA: Diagnosis not present

## 2015-08-16 DIAGNOSIS — Z6820 Body mass index (BMI) 20.0-20.9, adult: Secondary | ICD-10-CM | POA: Diagnosis not present

## 2015-08-16 DIAGNOSIS — Z1389 Encounter for screening for other disorder: Secondary | ICD-10-CM | POA: Diagnosis not present

## 2015-09-13 DIAGNOSIS — M1991 Primary osteoarthritis, unspecified site: Secondary | ICD-10-CM | POA: Diagnosis not present

## 2015-09-13 DIAGNOSIS — R600 Localized edema: Secondary | ICD-10-CM | POA: Diagnosis not present

## 2015-09-13 DIAGNOSIS — Z6822 Body mass index (BMI) 22.0-22.9, adult: Secondary | ICD-10-CM | POA: Diagnosis not present

## 2015-09-24 DIAGNOSIS — Z792 Long term (current) use of antibiotics: Secondary | ICD-10-CM | POA: Diagnosis not present

## 2015-09-24 DIAGNOSIS — R600 Localized edema: Secondary | ICD-10-CM | POA: Diagnosis not present

## 2015-09-24 DIAGNOSIS — H9190 Unspecified hearing loss, unspecified ear: Secondary | ICD-10-CM | POA: Diagnosis not present

## 2015-09-24 DIAGNOSIS — Z79891 Long term (current) use of opiate analgesic: Secondary | ICD-10-CM | POA: Diagnosis not present

## 2015-09-24 DIAGNOSIS — Z4800 Encounter for change or removal of nonsurgical wound dressing: Secondary | ICD-10-CM | POA: Diagnosis not present

## 2015-09-24 DIAGNOSIS — M1991 Primary osteoarthritis, unspecified site: Secondary | ICD-10-CM | POA: Diagnosis not present

## 2015-09-24 DIAGNOSIS — R238 Other skin changes: Secondary | ICD-10-CM | POA: Diagnosis not present

## 2015-09-24 DIAGNOSIS — Z7982 Long term (current) use of aspirin: Secondary | ICD-10-CM | POA: Diagnosis not present

## 2015-09-24 DIAGNOSIS — I10 Essential (primary) hypertension: Secondary | ICD-10-CM | POA: Diagnosis not present

## 2015-09-24 DIAGNOSIS — I872 Venous insufficiency (chronic) (peripheral): Secondary | ICD-10-CM | POA: Diagnosis not present

## 2015-09-29 DIAGNOSIS — Z792 Long term (current) use of antibiotics: Secondary | ICD-10-CM | POA: Diagnosis not present

## 2015-09-29 DIAGNOSIS — R238 Other skin changes: Secondary | ICD-10-CM | POA: Diagnosis not present

## 2015-09-29 DIAGNOSIS — Z4800 Encounter for change or removal of nonsurgical wound dressing: Secondary | ICD-10-CM | POA: Diagnosis not present

## 2015-09-29 DIAGNOSIS — Z7982 Long term (current) use of aspirin: Secondary | ICD-10-CM | POA: Diagnosis not present

## 2015-09-29 DIAGNOSIS — I10 Essential (primary) hypertension: Secondary | ICD-10-CM | POA: Diagnosis not present

## 2015-09-29 DIAGNOSIS — Z79891 Long term (current) use of opiate analgesic: Secondary | ICD-10-CM | POA: Diagnosis not present

## 2015-09-29 DIAGNOSIS — I872 Venous insufficiency (chronic) (peripheral): Secondary | ICD-10-CM | POA: Diagnosis not present

## 2015-09-29 DIAGNOSIS — M1991 Primary osteoarthritis, unspecified site: Secondary | ICD-10-CM | POA: Diagnosis not present

## 2015-09-29 DIAGNOSIS — H9190 Unspecified hearing loss, unspecified ear: Secondary | ICD-10-CM | POA: Diagnosis not present

## 2015-09-29 DIAGNOSIS — R600 Localized edema: Secondary | ICD-10-CM | POA: Diagnosis not present

## 2015-10-05 DIAGNOSIS — Z7982 Long term (current) use of aspirin: Secondary | ICD-10-CM | POA: Diagnosis not present

## 2015-10-05 DIAGNOSIS — Z4800 Encounter for change or removal of nonsurgical wound dressing: Secondary | ICD-10-CM | POA: Diagnosis not present

## 2015-10-05 DIAGNOSIS — R600 Localized edema: Secondary | ICD-10-CM | POA: Diagnosis not present

## 2015-10-05 DIAGNOSIS — M1991 Primary osteoarthritis, unspecified site: Secondary | ICD-10-CM | POA: Diagnosis not present

## 2015-10-05 DIAGNOSIS — I872 Venous insufficiency (chronic) (peripheral): Secondary | ICD-10-CM | POA: Diagnosis not present

## 2015-10-05 DIAGNOSIS — H9190 Unspecified hearing loss, unspecified ear: Secondary | ICD-10-CM | POA: Diagnosis not present

## 2015-10-05 DIAGNOSIS — Z79891 Long term (current) use of opiate analgesic: Secondary | ICD-10-CM | POA: Diagnosis not present

## 2015-10-05 DIAGNOSIS — Z792 Long term (current) use of antibiotics: Secondary | ICD-10-CM | POA: Diagnosis not present

## 2015-10-05 DIAGNOSIS — R238 Other skin changes: Secondary | ICD-10-CM | POA: Diagnosis not present

## 2015-10-05 DIAGNOSIS — I10 Essential (primary) hypertension: Secondary | ICD-10-CM | POA: Diagnosis not present

## 2015-10-09 ENCOUNTER — Other Ambulatory Visit: Payer: Self-pay | Admitting: Obstetrics & Gynecology

## 2015-10-12 DIAGNOSIS — H9190 Unspecified hearing loss, unspecified ear: Secondary | ICD-10-CM | POA: Diagnosis not present

## 2015-10-12 DIAGNOSIS — Z792 Long term (current) use of antibiotics: Secondary | ICD-10-CM | POA: Diagnosis not present

## 2015-10-12 DIAGNOSIS — Z4800 Encounter for change or removal of nonsurgical wound dressing: Secondary | ICD-10-CM | POA: Diagnosis not present

## 2015-10-12 DIAGNOSIS — R600 Localized edema: Secondary | ICD-10-CM | POA: Diagnosis not present

## 2015-10-12 DIAGNOSIS — R238 Other skin changes: Secondary | ICD-10-CM | POA: Diagnosis not present

## 2015-10-12 DIAGNOSIS — I872 Venous insufficiency (chronic) (peripheral): Secondary | ICD-10-CM | POA: Diagnosis not present

## 2015-10-12 DIAGNOSIS — M1991 Primary osteoarthritis, unspecified site: Secondary | ICD-10-CM | POA: Diagnosis not present

## 2015-10-12 DIAGNOSIS — I10 Essential (primary) hypertension: Secondary | ICD-10-CM | POA: Diagnosis not present

## 2015-10-12 DIAGNOSIS — Z7982 Long term (current) use of aspirin: Secondary | ICD-10-CM | POA: Diagnosis not present

## 2015-10-12 DIAGNOSIS — Z79891 Long term (current) use of opiate analgesic: Secondary | ICD-10-CM | POA: Diagnosis not present

## 2015-10-17 DIAGNOSIS — Z7982 Long term (current) use of aspirin: Secondary | ICD-10-CM | POA: Diagnosis not present

## 2015-10-17 DIAGNOSIS — H9190 Unspecified hearing loss, unspecified ear: Secondary | ICD-10-CM | POA: Diagnosis not present

## 2015-10-17 DIAGNOSIS — R238 Other skin changes: Secondary | ICD-10-CM | POA: Diagnosis not present

## 2015-10-17 DIAGNOSIS — Z792 Long term (current) use of antibiotics: Secondary | ICD-10-CM | POA: Diagnosis not present

## 2015-10-17 DIAGNOSIS — I872 Venous insufficiency (chronic) (peripheral): Secondary | ICD-10-CM | POA: Diagnosis not present

## 2015-10-17 DIAGNOSIS — I10 Essential (primary) hypertension: Secondary | ICD-10-CM | POA: Diagnosis not present

## 2015-10-17 DIAGNOSIS — Z4800 Encounter for change or removal of nonsurgical wound dressing: Secondary | ICD-10-CM | POA: Diagnosis not present

## 2015-10-17 DIAGNOSIS — Z79891 Long term (current) use of opiate analgesic: Secondary | ICD-10-CM | POA: Diagnosis not present

## 2015-10-17 DIAGNOSIS — R600 Localized edema: Secondary | ICD-10-CM | POA: Diagnosis not present

## 2015-10-17 DIAGNOSIS — M1991 Primary osteoarthritis, unspecified site: Secondary | ICD-10-CM | POA: Diagnosis not present

## 2015-10-26 DIAGNOSIS — H9190 Unspecified hearing loss, unspecified ear: Secondary | ICD-10-CM | POA: Diagnosis not present

## 2015-10-26 DIAGNOSIS — Z7982 Long term (current) use of aspirin: Secondary | ICD-10-CM | POA: Diagnosis not present

## 2015-10-26 DIAGNOSIS — Z79891 Long term (current) use of opiate analgesic: Secondary | ICD-10-CM | POA: Diagnosis not present

## 2015-10-26 DIAGNOSIS — M1991 Primary osteoarthritis, unspecified site: Secondary | ICD-10-CM | POA: Diagnosis not present

## 2015-10-26 DIAGNOSIS — R600 Localized edema: Secondary | ICD-10-CM | POA: Diagnosis not present

## 2015-10-26 DIAGNOSIS — I10 Essential (primary) hypertension: Secondary | ICD-10-CM | POA: Diagnosis not present

## 2015-10-26 DIAGNOSIS — I872 Venous insufficiency (chronic) (peripheral): Secondary | ICD-10-CM | POA: Diagnosis not present

## 2015-10-26 DIAGNOSIS — R238 Other skin changes: Secondary | ICD-10-CM | POA: Diagnosis not present

## 2015-10-26 DIAGNOSIS — Z4800 Encounter for change or removal of nonsurgical wound dressing: Secondary | ICD-10-CM | POA: Diagnosis not present

## 2015-10-26 DIAGNOSIS — Z792 Long term (current) use of antibiotics: Secondary | ICD-10-CM | POA: Diagnosis not present

## 2015-10-31 DIAGNOSIS — I10 Essential (primary) hypertension: Secondary | ICD-10-CM | POA: Diagnosis not present

## 2015-10-31 DIAGNOSIS — I872 Venous insufficiency (chronic) (peripheral): Secondary | ICD-10-CM | POA: Diagnosis not present

## 2015-10-31 DIAGNOSIS — Z4800 Encounter for change or removal of nonsurgical wound dressing: Secondary | ICD-10-CM | POA: Diagnosis not present

## 2015-10-31 DIAGNOSIS — H9190 Unspecified hearing loss, unspecified ear: Secondary | ICD-10-CM | POA: Diagnosis not present

## 2015-10-31 DIAGNOSIS — R600 Localized edema: Secondary | ICD-10-CM | POA: Diagnosis not present

## 2015-10-31 DIAGNOSIS — M1991 Primary osteoarthritis, unspecified site: Secondary | ICD-10-CM | POA: Diagnosis not present

## 2015-10-31 DIAGNOSIS — Z792 Long term (current) use of antibiotics: Secondary | ICD-10-CM | POA: Diagnosis not present

## 2015-10-31 DIAGNOSIS — Z79891 Long term (current) use of opiate analgesic: Secondary | ICD-10-CM | POA: Diagnosis not present

## 2015-10-31 DIAGNOSIS — R238 Other skin changes: Secondary | ICD-10-CM | POA: Diagnosis not present

## 2015-10-31 DIAGNOSIS — Z7982 Long term (current) use of aspirin: Secondary | ICD-10-CM | POA: Diagnosis not present

## 2015-11-03 DIAGNOSIS — Z792 Long term (current) use of antibiotics: Secondary | ICD-10-CM | POA: Diagnosis not present

## 2015-11-03 DIAGNOSIS — R238 Other skin changes: Secondary | ICD-10-CM | POA: Diagnosis not present

## 2015-11-03 DIAGNOSIS — H9190 Unspecified hearing loss, unspecified ear: Secondary | ICD-10-CM | POA: Diagnosis not present

## 2015-11-03 DIAGNOSIS — Z79891 Long term (current) use of opiate analgesic: Secondary | ICD-10-CM | POA: Diagnosis not present

## 2015-11-03 DIAGNOSIS — Z4800 Encounter for change or removal of nonsurgical wound dressing: Secondary | ICD-10-CM | POA: Diagnosis not present

## 2015-11-03 DIAGNOSIS — M1991 Primary osteoarthritis, unspecified site: Secondary | ICD-10-CM | POA: Diagnosis not present

## 2015-11-03 DIAGNOSIS — I872 Venous insufficiency (chronic) (peripheral): Secondary | ICD-10-CM | POA: Diagnosis not present

## 2015-11-03 DIAGNOSIS — Z7982 Long term (current) use of aspirin: Secondary | ICD-10-CM | POA: Diagnosis not present

## 2015-11-03 DIAGNOSIS — I10 Essential (primary) hypertension: Secondary | ICD-10-CM | POA: Diagnosis not present

## 2015-11-03 DIAGNOSIS — R600 Localized edema: Secondary | ICD-10-CM | POA: Diagnosis not present

## 2015-11-07 DIAGNOSIS — I10 Essential (primary) hypertension: Secondary | ICD-10-CM | POA: Diagnosis not present

## 2015-11-07 DIAGNOSIS — Z792 Long term (current) use of antibiotics: Secondary | ICD-10-CM | POA: Diagnosis not present

## 2015-11-07 DIAGNOSIS — R238 Other skin changes: Secondary | ICD-10-CM | POA: Diagnosis not present

## 2015-11-07 DIAGNOSIS — R600 Localized edema: Secondary | ICD-10-CM | POA: Diagnosis not present

## 2015-11-07 DIAGNOSIS — Z79891 Long term (current) use of opiate analgesic: Secondary | ICD-10-CM | POA: Diagnosis not present

## 2015-11-07 DIAGNOSIS — H9190 Unspecified hearing loss, unspecified ear: Secondary | ICD-10-CM | POA: Diagnosis not present

## 2015-11-07 DIAGNOSIS — Z4800 Encounter for change or removal of nonsurgical wound dressing: Secondary | ICD-10-CM | POA: Diagnosis not present

## 2015-11-07 DIAGNOSIS — I872 Venous insufficiency (chronic) (peripheral): Secondary | ICD-10-CM | POA: Diagnosis not present

## 2015-11-07 DIAGNOSIS — Z7982 Long term (current) use of aspirin: Secondary | ICD-10-CM | POA: Diagnosis not present

## 2015-11-07 DIAGNOSIS — M1991 Primary osteoarthritis, unspecified site: Secondary | ICD-10-CM | POA: Diagnosis not present

## 2015-11-09 DIAGNOSIS — I872 Venous insufficiency (chronic) (peripheral): Secondary | ICD-10-CM | POA: Diagnosis not present

## 2015-11-09 DIAGNOSIS — Z79891 Long term (current) use of opiate analgesic: Secondary | ICD-10-CM | POA: Diagnosis not present

## 2015-11-09 DIAGNOSIS — Z4800 Encounter for change or removal of nonsurgical wound dressing: Secondary | ICD-10-CM | POA: Diagnosis not present

## 2015-11-09 DIAGNOSIS — H9190 Unspecified hearing loss, unspecified ear: Secondary | ICD-10-CM | POA: Diagnosis not present

## 2015-11-09 DIAGNOSIS — I10 Essential (primary) hypertension: Secondary | ICD-10-CM | POA: Diagnosis not present

## 2015-11-09 DIAGNOSIS — Z792 Long term (current) use of antibiotics: Secondary | ICD-10-CM | POA: Diagnosis not present

## 2015-11-09 DIAGNOSIS — R600 Localized edema: Secondary | ICD-10-CM | POA: Diagnosis not present

## 2015-11-09 DIAGNOSIS — M1991 Primary osteoarthritis, unspecified site: Secondary | ICD-10-CM | POA: Diagnosis not present

## 2015-11-09 DIAGNOSIS — Z7982 Long term (current) use of aspirin: Secondary | ICD-10-CM | POA: Diagnosis not present

## 2015-11-09 DIAGNOSIS — R238 Other skin changes: Secondary | ICD-10-CM | POA: Diagnosis not present

## 2015-11-16 DIAGNOSIS — M1991 Primary osteoarthritis, unspecified site: Secondary | ICD-10-CM | POA: Diagnosis not present

## 2015-11-16 DIAGNOSIS — R600 Localized edema: Secondary | ICD-10-CM | POA: Diagnosis not present

## 2015-11-16 DIAGNOSIS — Z79891 Long term (current) use of opiate analgesic: Secondary | ICD-10-CM | POA: Diagnosis not present

## 2015-11-16 DIAGNOSIS — R238 Other skin changes: Secondary | ICD-10-CM | POA: Diagnosis not present

## 2015-11-16 DIAGNOSIS — H9190 Unspecified hearing loss, unspecified ear: Secondary | ICD-10-CM | POA: Diagnosis not present

## 2015-11-16 DIAGNOSIS — Z7982 Long term (current) use of aspirin: Secondary | ICD-10-CM | POA: Diagnosis not present

## 2015-11-16 DIAGNOSIS — Z792 Long term (current) use of antibiotics: Secondary | ICD-10-CM | POA: Diagnosis not present

## 2015-11-16 DIAGNOSIS — I10 Essential (primary) hypertension: Secondary | ICD-10-CM | POA: Diagnosis not present

## 2015-11-16 DIAGNOSIS — I872 Venous insufficiency (chronic) (peripheral): Secondary | ICD-10-CM | POA: Diagnosis not present

## 2015-11-16 DIAGNOSIS — Z4800 Encounter for change or removal of nonsurgical wound dressing: Secondary | ICD-10-CM | POA: Diagnosis not present

## 2015-11-21 ENCOUNTER — Other Ambulatory Visit (HOSPITAL_COMMUNITY): Payer: Medicare HMO

## 2015-11-21 ENCOUNTER — Ambulatory Visit (HOSPITAL_COMMUNITY): Payer: Medicare HMO | Admitting: Oncology

## 2015-11-22 ENCOUNTER — Other Ambulatory Visit (HOSPITAL_COMMUNITY): Payer: Medicare HMO

## 2015-11-22 ENCOUNTER — Ambulatory Visit (HOSPITAL_COMMUNITY): Payer: Medicare HMO | Admitting: Oncology

## 2015-11-22 DIAGNOSIS — Z1389 Encounter for screening for other disorder: Secondary | ICD-10-CM | POA: Diagnosis not present

## 2015-11-22 DIAGNOSIS — M1991 Primary osteoarthritis, unspecified site: Secondary | ICD-10-CM | POA: Diagnosis not present

## 2015-11-22 DIAGNOSIS — Z6821 Body mass index (BMI) 21.0-21.9, adult: Secondary | ICD-10-CM | POA: Diagnosis not present

## 2015-11-22 DIAGNOSIS — R600 Localized edema: Secondary | ICD-10-CM | POA: Diagnosis not present

## 2015-11-22 DIAGNOSIS — M4180 Other forms of scoliosis, site unspecified: Secondary | ICD-10-CM | POA: Diagnosis not present

## 2015-11-22 DIAGNOSIS — M810 Age-related osteoporosis without current pathological fracture: Secondary | ICD-10-CM | POA: Diagnosis not present

## 2015-11-22 DIAGNOSIS — I872 Venous insufficiency (chronic) (peripheral): Secondary | ICD-10-CM | POA: Diagnosis not present

## 2015-11-24 ENCOUNTER — Ambulatory Visit (HOSPITAL_COMMUNITY): Payer: Medicare HMO | Admitting: Oncology

## 2015-11-24 ENCOUNTER — Other Ambulatory Visit (HOSPITAL_COMMUNITY): Payer: Medicare HMO

## 2015-12-13 ENCOUNTER — Other Ambulatory Visit (HOSPITAL_COMMUNITY): Payer: Medicare HMO

## 2015-12-19 DIAGNOSIS — H52203 Unspecified astigmatism, bilateral: Secondary | ICD-10-CM | POA: Diagnosis not present

## 2015-12-19 DIAGNOSIS — H2513 Age-related nuclear cataract, bilateral: Secondary | ICD-10-CM | POA: Diagnosis not present

## 2015-12-19 DIAGNOSIS — H2512 Age-related nuclear cataract, left eye: Secondary | ICD-10-CM | POA: Diagnosis not present

## 2015-12-19 DIAGNOSIS — Z961 Presence of intraocular lens: Secondary | ICD-10-CM | POA: Diagnosis not present

## 2015-12-20 ENCOUNTER — Ambulatory Visit (HOSPITAL_COMMUNITY): Payer: Medicare HMO | Admitting: Oncology

## 2015-12-20 ENCOUNTER — Other Ambulatory Visit (HOSPITAL_COMMUNITY): Payer: Medicare HMO

## 2016-01-10 ENCOUNTER — Encounter (HOSPITAL_COMMUNITY): Payer: Medicare Other | Attending: Hematology & Oncology

## 2016-01-10 DIAGNOSIS — D509 Iron deficiency anemia, unspecified: Secondary | ICD-10-CM | POA: Insufficient documentation

## 2016-01-10 LAB — CBC WITH DIFFERENTIAL/PLATELET
BASOS ABS: 0 10*3/uL (ref 0.0–0.1)
BASOS PCT: 0 %
Eosinophils Absolute: 0.3 10*3/uL (ref 0.0–0.7)
Eosinophils Relative: 4 %
HEMATOCRIT: 43.5 % (ref 36.0–46.0)
HEMOGLOBIN: 13.2 g/dL (ref 12.0–15.0)
LYMPHS PCT: 24 %
Lymphs Abs: 1.9 10*3/uL (ref 0.7–4.0)
MCH: 29.3 pg (ref 26.0–34.0)
MCHC: 30.3 g/dL (ref 30.0–36.0)
MCV: 96.7 fL (ref 78.0–100.0)
MONOS PCT: 8 %
Monocytes Absolute: 0.6 10*3/uL (ref 0.1–1.0)
NEUTROS ABS: 5 10*3/uL (ref 1.7–7.7)
NEUTROS PCT: 64 %
Platelets: 113 10*3/uL — ABNORMAL LOW (ref 150–400)
RBC: 4.5 MIL/uL (ref 3.87–5.11)
RDW: 13.9 % (ref 11.5–15.5)
WBC: 7.8 10*3/uL (ref 4.0–10.5)

## 2016-01-10 LAB — COMPREHENSIVE METABOLIC PANEL
ALBUMIN: 3.9 g/dL (ref 3.5–5.0)
ALK PHOS: 57 U/L (ref 38–126)
ALT: 10 U/L — ABNORMAL LOW (ref 14–54)
ANION GAP: 7 (ref 5–15)
AST: 18 U/L (ref 15–41)
BILIRUBIN TOTAL: 1 mg/dL (ref 0.3–1.2)
BUN: 18 mg/dL (ref 6–20)
CALCIUM: 9.3 mg/dL (ref 8.9–10.3)
CO2: 30 mmol/L (ref 22–32)
Chloride: 100 mmol/L — ABNORMAL LOW (ref 101–111)
Creatinine, Ser: 0.88 mg/dL (ref 0.44–1.00)
GFR calc Af Amer: >60 mL/min (ref >60)
GFR calc non Af Amer: 60 mL/min — ABNORMAL LOW (ref >60)
GLUCOSE: 86 mg/dL (ref 65–99)
Potassium: 4.5 mmol/L (ref 3.5–5.1)
Sodium: 137 mmol/L (ref 135–145)
TOTAL PROTEIN: 6.6 g/dL (ref 6.5–8.1)

## 2016-01-10 LAB — FERRITIN: Ferritin: 67 ng/mL (ref 11–307)

## 2016-01-17 ENCOUNTER — Encounter (HOSPITAL_BASED_OUTPATIENT_CLINIC_OR_DEPARTMENT_OTHER): Payer: Medicare Other | Admitting: Oncology

## 2016-01-17 ENCOUNTER — Encounter (HOSPITAL_COMMUNITY): Payer: Self-pay | Admitting: Oncology

## 2016-01-17 VITALS — BP 124/48 | HR 60 | Temp 97.7°F | Resp 16 | Wt 108.9 lb

## 2016-01-17 DIAGNOSIS — M810 Age-related osteoporosis without current pathological fracture: Secondary | ICD-10-CM

## 2016-01-17 DIAGNOSIS — D696 Thrombocytopenia, unspecified: Secondary | ICD-10-CM | POA: Diagnosis not present

## 2016-01-17 DIAGNOSIS — C50912 Malignant neoplasm of unspecified site of left female breast: Secondary | ICD-10-CM | POA: Diagnosis not present

## 2016-01-17 DIAGNOSIS — D509 Iron deficiency anemia, unspecified: Secondary | ICD-10-CM

## 2016-01-17 NOTE — Assessment & Plan Note (Addendum)
Osteoporosis with severe kyphosis.  She is on Ca++ and Vit D.  Bone density in 2015 shows a T-score of -5.7.  She notes a recent bone density exam by her primary care provider.  Results are not available to me.  I will see if we can get a copy of these results.  She reports that her primary care provider is managing her osteoporosis.

## 2016-01-17 NOTE — Assessment & Plan Note (Addendum)
Stage IB, grade 1, ER +99%, PR +100%, HER-2/neu negative, left-sided breast cancer 8 mm in size status post lumpectomy and sentinel lymph node biopsy which was negative. Ki-67 marker was low 6% with surgery on 04/24/2009, followed by Arimidex therapy which is completed.  Labs performed on 01/10/2016: CBC diff, CMET, ferritin.  I personally reviewed and went over laboratory results with the patient.  The results are noted within this dictation.  Mild thrombocytopenia is noted and stable.  I personally reviewed and went over radiographic studies with the patient.  The results are noted within this dictation.  Mammogram in Dec 2016 was BIRADS 2.  She will be due for her next mammogram in Dec 2017.  Order is placed.  Labs in 6 months: CBC diff, CMET, ferritin.  She has regular follow-up with her primary care provider where she receives "sessions in injections" for her arthritis.  She notes another "session" in September.  She is wearing her compression stockings and is still working with the wound center periodically for her lower extremity ulcers that are cleanly dressed.  Return in 6 months for follow-up.

## 2016-01-17 NOTE — Patient Instructions (Signed)
[place] at [facility] Discharge Instructions  RECOMMENDATIONS MADE BY THE CONSULTANT AND ANY TEST RESULTS WILL BE SENT TO YOUR REFERRING PHYSICIAN.  You were seen by Ashley [name] today Mammogram schedule in December  Return in 6 months for follow up and labs  Follow up with Dr. Gerarda Fraction as directed.  Thank you for choosing [place] at [facility] to provide your oncology and hematology care.  To afford each patient quality time with our provider, please arrive at least 15 minutes before your scheduled appointment time.   Beginning January 23rd 2017 lab work for the [facility] will be done in the  Main lab at [facility] on 1st floor. If you have a lab appointment with the [facility] please come in thru the  Main Entrance and check in at the main information desk  You need to re-schedule your appointment should you arrive 10 or more minutes late.  We strive to give you quality time with our providers, and arriving late affects you and other patients whose appointments are after yours.  Also, if you no show three or more times for appointments you may be dismissed from the clinic at the providers discretion.     Again, thank you for choosing [facility].  Our hope is that these requests will decrease the amount of time that you wait before being seen by our physicians.       _____________________________________________________________  Should you have questions after your visit to [facility], please contact our office at [phone] between the hours of 8:30 a.m. and 4:30 p.m.  Voicemails left after 4:30 p.m. will not be returned until the following business day.  For prescription refill requests, have your pharmacy contact our office.         Resources For Cancer Patients and their Caregivers ? American Cancer Society: Can assist with transportation, wigs, general needs, runs Look Good Feel Better.         9307342369 ? Cancer Care: Provides financial assistance, online support groups, medication/co-pay assistance.  1-800-813-HOPE 754-814-0188) ? [facility] Assists [place] cancer patients and their families through emotional , educational and financial support.  513-579-8917 ? [place] DSS Where to apply for food stamps, Medicaid and utility assistance. 940-306-0110 ? [facility]: Transportation to medical appointments. 613 579 3885 ? Social Security Administration: May apply for disability if have a Stage IV cancer. 3090981459 567 598 9390 ? [employer], Disability and Transit Services: Assists with nutrition, care and transit needs. [place] Support Programs: @10RELATIVEDAYS @ > Cancer Support Group  2nd Tuesday of the month 1pm-2pm, Journey Room  > Creative Journey  3rd Tuesday of the month 1130am-1pm, Journey Room  > Look Good Feel Better  1st Wednesday of the month 10am-12 noon, Journey Room (Call [place] to register [phone])

## 2016-01-17 NOTE — Progress Notes (Signed)
[name]., MD [place] [address]  Malignant neoplasm of left female breast, unspecified site of breast ([place]) - Plan: MM DIAG BREAST TOMO BILATERAL, CBC with Differential, Comprehensive metabolic panel  Osteoporosis - Plan: Comprehensive metabolic panel  Iron deficiency anemia - Plan: CBC with Differential, Ferritin  CURRENT THERAPY: Surveillance per NCCN guidelines  INTERVAL HISTORY: Ashley [name] 82 y.o. female returns for followup of Stage IB, grade 1, ER +99%, PR +100%, HER-2/neu negative, left-sided breast cancer 8 mm in size status post lumpectomy and sentinel lymph node biopsy which was negative. Ki-67 marker was low 6% with surgery on 04/24/2009, followed by Arimidex therapy which is completed. AND Osteoporosis, on Ca++ and Vit D.  She reports episodes of "arthritic flairs" that is being managed by "sessions of injections" by her primary care provider.  She reports that they are steroid injections.  "I am doing okay dear."  Review of Systems  Constitutional: Negative.  Negative for chills and fever.  HENT: Negative.   Eyes: Negative.   Respiratory: Negative.  Negative for cough.   Cardiovascular: Negative.  Negative for chest pain.  Gastrointestinal: Negative.   Genitourinary: Negative.   Musculoskeletal: Positive for joint pain.  Skin: Negative.   Neurological: Negative.   Endo/Heme/Allergies: Negative.   Psychiatric/Behavioral: Negative.     Past Medical History:  Diagnosis Date  . Acid reflux   . Arthritis   . At risk for falls   . Back pain   . Bilateral ovarian cysts 02/20/2012  . Cancer ([place])   . Edema   . Hypertension   . Left-sided breast cancer 11/14/2011   Started Arimidex on 07/25/2009. Stage IB, grade 1, ER 99%, PR 100%, Her2 negative, left-sided breast cancer, 8 mm in size, S/P lumpectomy on 04/24/2010, and sentinel node biopsy.  Ki-67 marker 6%.  . Osteoporosis 03/06/2012   Switched from Arimidex to  Tamoxifen as a result of osteoporosis   . Thyroid disease     Past Surgical History:  Procedure Laterality Date  . APPENDECTOMY    . BREAST SURGERY    . CHOLECYSTECTOMY    . HERNIA REPAIR    . HIP FRACTURE SURGERY    . TONSILLECTOMY    . TOTAL KNEE REVISION      History reviewed. No pertinent family history.  Social History   Social History  . Marital status: Divorced    Spouse name: N/A  . Number of children: N/A  . Years of education: N/A   Social History Main Topics  . Smoking status: Never Smoker  . Smokeless tobacco: Never Used  . Alcohol use No  . Drug use: No  . Sexual activity: Not Asked   Other Topics Concern  . None   Social History Narrative  . None     PHYSICAL EXAMINATION  ECOG PERFORMANCE STATUS: 2 - Symptomatic, <50% confined to bed  Vitals:   01/17/16 1100  BP: (!) 124/48  Pulse: 60  Resp: 16  Temp: 97.7 F (36.5 C)    GENERAL:alert, no distress, comfortable, cooperative, smiling and in wheelchair, accompanied by friend. SKIN: skin color, texture, turgor are normal, no rashes or significant lesions HEAD: Normocephalic, No masses, lesions, tenderness or abnormalities EYES: normal, EOMI EARS: External ears normal OROPHARYNX:lips, buccal mucosa, and tongue normal and mucous membranes are moist  NECK: supple, trachea midline LYMPH:  no palpable lymphadenopathy BREAST:patient declines to have breast exam LUNGS: clear to auscultation and percussion HEART: regular rate &  rhythm ABDOMEN:abdomen soft and normal bowel sounds BACK: dorsal kyphosis of marked degree EXTREMITIES:less then 2 second capillary refill, no joint deformities, effusion, or inflammation, no skin discoloration, no cyanosis, compression stockings are in place with tegaderms on lower extremities.  NEURO: alert & oriented x 3 with fluent speech, no focal motor/sensory deficits, in wheelchair   LABORATORY DATA: CBC    Component Value Date/Time   WBC 7.8 01/10/2016 1325    RBC 4.50 01/10/2016 1325   HGB 13.2 01/10/2016 1325   HCT 43.5 01/10/2016 1325   PLT 113 (L) 01/10/2016 1325   MCV 96.7 01/10/2016 1325   MCH 29.3 01/10/2016 1325   MCHC 30.3 01/10/2016 1325   RDW 13.9 01/10/2016 1325   LYMPHSABS 1.9 01/10/2016 1325   MONOABS 0.6 01/10/2016 1325   EOSABS 0.3 01/10/2016 1325   BASOSABS 0.0 01/10/2016 1325      Chemistry      Component Value Date/Time   NA 137 01/10/2016 1325   K 4.5 01/10/2016 1325   CL 100 (L) 01/10/2016 1325   CO2 30 01/10/2016 1325   BUN 18 01/10/2016 1325   CREATININE 0.88 01/10/2016 1325      Component Value Date/Time   CALCIUM 9.3 01/10/2016 1325   ALKPHOS 57 01/10/2016 1325   AST 18 01/10/2016 1325   ALT 10 (L) 01/10/2016 1325   BILITOT 1.0 01/10/2016 1325        PENDING LABS:   RADIOGRAPHIC STUDIES:  No results found.   PATHOLOGY:    ASSESSMENT AND PLAN:  Left-sided breast cancer Stage IB, grade 1, ER +99%, PR +100%, HER-2/neu negative, left-sided breast cancer 8 mm in size status post lumpectomy and sentinel lymph node biopsy which was negative. Ki-67 marker was low 6% with surgery on 04/24/2009, followed by Arimidex therapy which is completed.  Labs performed on 01/10/2016: CBC diff, CMET, ferritin.  I personally reviewed and went over laboratory results with the patient.  The results are noted within this dictation.  Mild thrombocytopenia is noted and stable.  I personally reviewed and went over radiographic studies with the patient.  The results are noted within this dictation.  Mammogram in Dec 2016 was BIRADS 2.  She will be due for her next mammogram in Dec 2017.  Order is placed.  Labs in 6 months: CBC diff, CMET, ferritin.  She has regular follow-up with her primary care provider where she receives "sessions in injections" for her arthritis.  She notes another "session" in September.  She is wearing her compression stockings and is still working with the wound center periodically for her lower  extremity ulcers that are cleanly dressed.  Return in 6 months for follow-up.  Osteoporosis Osteoporosis with severe kyphosis.  She is on Ca++ and Vit D.  Bone density in 2015 shows a T-score of -5.7.  She notes a recent bone density exam by her primary care provider.  Results are not available to me.  I will see if we can get a copy of these results.  She reports that her primary care provider is managing her osteoporosis.     ORDERS PLACED FOR THIS ENCOUNTER: Orders Placed This Encounter  Procedures  . MM DIAG BREAST TOMO BILATERAL  . CBC with Differential  . Comprehensive metabolic panel  . Ferritin    MEDICATIONS PRESCRIBED THIS ENCOUNTER: No orders of the defined types were placed in this encounter.   THERAPY PLAN:  NCCN guidelines recommends the following surveillance for invasive breast cancer (2.2017):  A. History  and Physical exam 1-4 times per year as clinically appropriate for 5 years, then annually.  B. Periodic screening for changes in family history and referral to genetics counseling as indicated  C. Educate, monitor, and refer to lymphedema management.  D. Mammography every 12 months  E. Routine imaging of reconstructed breast is not indicated.  F. In the absence of clinical signs and symptoms suggestive of recurrent disease, there is no indication for laboratory or imaging studies for metastases screening.  G. Women on Tamoxifen: annual gynecologic assessment every 12 months if uterus is present.  H. Women on aromatase inhibitor or who experience ovarian failure secondary to treatment should have monitoring of bone health with a bone mineral density determination at baseline and periodically thereafter.  I. Assess and encourage adherence to adjuvant endocrine therapy.  J. Evidence suggests that active lifestyle, healthy diet, limited alcohol intake, and achieving and maintaining an ideal body weight (20-25 BMI) may lead to optimal breast cancer outcomes.   All  questions were answered. The patient knows to call the clinic with any problems, questions or concerns. We can certainly see the patient much sooner if necessary.  Patient and plan discussed with Dr. Ancil Linsey and she is in agreement with the aforementioned.   This note is electronically signed by: Doy Mince 01/17/2016 5:33 PM

## 2016-02-01 ENCOUNTER — Telehealth (HOSPITAL_COMMUNITY): Payer: Self-pay | Admitting: *Deleted

## 2016-02-01 NOTE — Telephone Encounter (Signed)
-----   Message from Louis Meckel sent at 02/01/2016 10:54 AM EDT ----- Regarding: question about med Please call patient she has question about a med list.

## 2016-02-01 NOTE — Telephone Encounter (Signed)
Pt states that she was looking at her medication list and that she is NOT taking the gabapentin.  I took the medication out of her list.

## 2016-02-19 DIAGNOSIS — D61818 Other pancytopenia: Secondary | ICD-10-CM | POA: Diagnosis not present

## 2016-02-19 DIAGNOSIS — Z6821 Body mass index (BMI) 21.0-21.9, adult: Secondary | ICD-10-CM | POA: Diagnosis not present

## 2016-02-19 DIAGNOSIS — M1991 Primary osteoarthritis, unspecified site: Secondary | ICD-10-CM | POA: Diagnosis not present

## 2016-02-19 DIAGNOSIS — M40209 Unspecified kyphosis, site unspecified: Secondary | ICD-10-CM | POA: Diagnosis not present

## 2016-02-19 DIAGNOSIS — E063 Autoimmune thyroiditis: Secondary | ICD-10-CM | POA: Diagnosis not present

## 2016-02-19 DIAGNOSIS — M810 Age-related osteoporosis without current pathological fracture: Secondary | ICD-10-CM | POA: Diagnosis not present

## 2016-02-19 DIAGNOSIS — I10 Essential (primary) hypertension: Secondary | ICD-10-CM | POA: Diagnosis not present

## 2016-02-19 DIAGNOSIS — G894 Chronic pain syndrome: Secondary | ICD-10-CM | POA: Diagnosis not present

## 2016-03-02 ENCOUNTER — Emergency Department (HOSPITAL_COMMUNITY): Payer: Medicare Other

## 2016-03-02 ENCOUNTER — Inpatient Hospital Stay (HOSPITAL_COMMUNITY)
Admission: EM | Admit: 2016-03-02 | Discharge: 2016-03-05 | DRG: 184 | Disposition: A | Discharge status: Skilled Nursing Facility | Payer: Medicare Other | Attending: Internal Medicine | Admitting: Internal Medicine

## 2016-03-02 ENCOUNTER — Other Ambulatory Visit (HOSPITAL_COMMUNITY): Payer: Self-pay

## 2016-03-02 ENCOUNTER — Other Ambulatory Visit: Payer: Self-pay

## 2016-03-02 ENCOUNTER — Encounter (HOSPITAL_COMMUNITY): Payer: Self-pay | Admitting: Emergency Medicine

## 2016-03-02 DIAGNOSIS — W010XXA Fall on same level from slipping, tripping and stumbling without subsequent striking against object, initial encounter: Secondary | ICD-10-CM | POA: Diagnosis present

## 2016-03-02 DIAGNOSIS — R0902 Hypoxemia: Secondary | ICD-10-CM | POA: Diagnosis not present

## 2016-03-02 DIAGNOSIS — S42212A Unspecified displaced fracture of surgical neck of left humerus, initial encounter for closed fracture: Secondary | ICD-10-CM | POA: Diagnosis not present

## 2016-03-02 DIAGNOSIS — E039 Hypothyroidism, unspecified: Secondary | ICD-10-CM | POA: Diagnosis present

## 2016-03-02 DIAGNOSIS — S42309A Unspecified fracture of shaft of humerus, unspecified arm, initial encounter for closed fracture: Secondary | ICD-10-CM | POA: Diagnosis present

## 2016-03-02 DIAGNOSIS — S2239XA Fracture of one rib, unspecified side, initial encounter for closed fracture: Secondary | ICD-10-CM | POA: Diagnosis present

## 2016-03-02 DIAGNOSIS — Z9889 Other specified postprocedural states: Secondary | ICD-10-CM

## 2016-03-02 DIAGNOSIS — W19XXXA Unspecified fall, initial encounter: Secondary | ICD-10-CM | POA: Diagnosis not present

## 2016-03-02 DIAGNOSIS — I10 Essential (primary) hypertension: Secondary | ICD-10-CM | POA: Diagnosis present

## 2016-03-02 DIAGNOSIS — Y92019 Unspecified place in single-family (private) house as the place of occurrence of the external cause: Secondary | ICD-10-CM | POA: Diagnosis not present

## 2016-03-02 DIAGNOSIS — M25512 Pain in left shoulder: Secondary | ICD-10-CM | POA: Diagnosis not present

## 2016-03-02 DIAGNOSIS — M25522 Pain in left elbow: Secondary | ICD-10-CM | POA: Diagnosis not present

## 2016-03-02 DIAGNOSIS — D509 Iron deficiency anemia, unspecified: Secondary | ICD-10-CM | POA: Diagnosis not present

## 2016-03-02 DIAGNOSIS — Z888 Allergy status to other drugs, medicaments and biological substances status: Secondary | ICD-10-CM

## 2016-03-02 DIAGNOSIS — M6281 Muscle weakness (generalized): Secondary | ICD-10-CM | POA: Diagnosis not present

## 2016-03-02 DIAGNOSIS — S2231XB Fracture of one rib, right side, initial encounter for open fracture: Secondary | ICD-10-CM | POA: Diagnosis not present

## 2016-03-02 DIAGNOSIS — S2242XA Multiple fractures of ribs, left side, initial encounter for closed fracture: Secondary | ICD-10-CM | POA: Diagnosis not present

## 2016-03-02 DIAGNOSIS — S42215D Unspecified nondisplaced fracture of surgical neck of left humerus, subsequent encounter for fracture with routine healing: Secondary | ICD-10-CM | POA: Diagnosis not present

## 2016-03-02 DIAGNOSIS — R279 Unspecified lack of coordination: Secondary | ICD-10-CM | POA: Diagnosis not present

## 2016-03-02 DIAGNOSIS — Z79899 Other long term (current) drug therapy: Secondary | ICD-10-CM

## 2016-03-02 DIAGNOSIS — S42215A Unspecified nondisplaced fracture of surgical neck of left humerus, initial encounter for closed fracture: Secondary | ICD-10-CM | POA: Diagnosis not present

## 2016-03-02 DIAGNOSIS — S2249XA Multiple fractures of ribs, unspecified side, initial encounter for closed fracture: Secondary | ICD-10-CM | POA: Diagnosis present

## 2016-03-02 DIAGNOSIS — Z853 Personal history of malignant neoplasm of breast: Secondary | ICD-10-CM | POA: Diagnosis not present

## 2016-03-02 DIAGNOSIS — J9811 Atelectasis: Secondary | ICD-10-CM | POA: Diagnosis not present

## 2016-03-02 DIAGNOSIS — N390 Urinary tract infection, site not specified: Secondary | ICD-10-CM | POA: Diagnosis not present

## 2016-03-02 DIAGNOSIS — S42225A 2-part nondisplaced fracture of surgical neck of left humerus, initial encounter for closed fracture: Secondary | ICD-10-CM | POA: Diagnosis not present

## 2016-03-02 DIAGNOSIS — S6992XA Unspecified injury of left wrist, hand and finger(s), initial encounter: Secondary | ICD-10-CM | POA: Diagnosis not present

## 2016-03-02 DIAGNOSIS — S59902A Unspecified injury of left elbow, initial encounter: Secondary | ICD-10-CM | POA: Diagnosis not present

## 2016-03-02 DIAGNOSIS — S42292A Other displaced fracture of upper end of left humerus, initial encounter for closed fracture: Secondary | ICD-10-CM | POA: Diagnosis not present

## 2016-03-02 DIAGNOSIS — K219 Gastro-esophageal reflux disease without esophagitis: Secondary | ICD-10-CM | POA: Diagnosis present

## 2016-03-02 DIAGNOSIS — R2681 Unsteadiness on feet: Secondary | ICD-10-CM | POA: Diagnosis not present

## 2016-03-02 DIAGNOSIS — Z880 Allergy status to penicillin: Secondary | ICD-10-CM

## 2016-03-02 DIAGNOSIS — S2242XD Multiple fractures of ribs, left side, subsequent encounter for fracture with routine healing: Secondary | ICD-10-CM | POA: Diagnosis not present

## 2016-03-02 DIAGNOSIS — R278 Other lack of coordination: Secondary | ICD-10-CM | POA: Diagnosis not present

## 2016-03-02 DIAGNOSIS — I4510 Unspecified right bundle-branch block: Secondary | ICD-10-CM | POA: Diagnosis not present

## 2016-03-02 DIAGNOSIS — Z7401 Bed confinement status: Secondary | ICD-10-CM | POA: Diagnosis not present

## 2016-03-02 DIAGNOSIS — Z886 Allergy status to analgesic agent status: Secondary | ICD-10-CM | POA: Diagnosis not present

## 2016-03-02 DIAGNOSIS — Z9181 History of falling: Secondary | ICD-10-CM | POA: Diagnosis not present

## 2016-03-02 DIAGNOSIS — M810 Age-related osteoporosis without current pathological fracture: Secondary | ICD-10-CM | POA: Diagnosis not present

## 2016-03-02 DIAGNOSIS — Z7982 Long term (current) use of aspirin: Secondary | ICD-10-CM | POA: Diagnosis not present

## 2016-03-02 DIAGNOSIS — D696 Thrombocytopenia, unspecified: Secondary | ICD-10-CM | POA: Diagnosis present

## 2016-03-02 DIAGNOSIS — M25532 Pain in left wrist: Secondary | ICD-10-CM | POA: Diagnosis not present

## 2016-03-02 DIAGNOSIS — Y92009 Unspecified place in unspecified non-institutional (private) residence as the place of occurrence of the external cause: Secondary | ICD-10-CM

## 2016-03-02 DIAGNOSIS — M80021D Age-related osteoporosis with current pathological fracture, right humerus, subsequent encounter for fracture with routine healing: Secondary | ICD-10-CM | POA: Diagnosis not present

## 2016-03-02 LAB — CBC WITH DIFFERENTIAL/PLATELET
BASOS ABS: 0 10*3/uL (ref 0.0–0.1)
Basophils Relative: 0 %
EOS PCT: 0 %
Eosinophils Absolute: 0 10*3/uL (ref 0.0–0.7)
HEMATOCRIT: 37.7 % (ref 36.0–46.0)
Hemoglobin: 11.6 g/dL — ABNORMAL LOW (ref 12.0–15.0)
LYMPHS PCT: 6 %
Lymphs Abs: 0.9 10*3/uL (ref 0.7–4.0)
MCH: 29.4 pg (ref 26.0–34.0)
MCHC: 30.8 g/dL (ref 30.0–36.0)
MCV: 95.7 fL (ref 78.0–100.0)
Monocytes Absolute: 0.8 10*3/uL (ref 0.1–1.0)
Monocytes Relative: 6 %
NEUTROS ABS: 12.3 10*3/uL — AB (ref 1.7–7.7)
Neutrophils Relative %: 88 %
PLATELETS: 97 10*3/uL — AB (ref 150–400)
RBC: 3.94 MIL/uL (ref 3.87–5.11)
RDW: 13.8 % (ref 11.5–15.5)
WBC: 14 10*3/uL — AB (ref 4.0–10.5)

## 2016-03-02 LAB — BASIC METABOLIC PANEL
ANION GAP: 7 (ref 5–15)
BUN: 21 mg/dL — ABNORMAL HIGH (ref 6–20)
CO2: 28 mmol/L (ref 22–32)
Calcium: 9.3 mg/dL (ref 8.9–10.3)
Chloride: 103 mmol/L (ref 101–111)
Creatinine, Ser: 0.75 mg/dL (ref 0.44–1.00)
GLUCOSE: 169 mg/dL — AB (ref 65–99)
POTASSIUM: 5 mmol/L (ref 3.5–5.1)
Sodium: 138 mmol/L (ref 135–145)

## 2016-03-02 MED ORDER — MORPHINE SULFATE (PF) 4 MG/ML IV SOLN
4.0000 mg | INTRAVENOUS | Status: DC | PRN
  Administered 2016-03-02: 4 mg via INTRAVENOUS
  Filled 2016-03-02: qty 1

## 2016-03-02 MED ORDER — ONDANSETRON HCL 4 MG/2ML IJ SOLN
4.0000 mg | Freq: Once | INTRAMUSCULAR | Status: AC
  Administered 2016-03-02: 4 mg via INTRAVENOUS
  Filled 2016-03-02: qty 2

## 2016-03-02 NOTE — ED Notes (Signed)
Pt states she does not normally wear oxygen and she is not going to wear it.  Pt talking nonstop with no shortness of breath.

## 2016-03-02 NOTE — ED Triage Notes (Signed)
Pt states her walker slipped out from under her and she fell and landed on left shoulder and wrist.

## 2016-03-02 NOTE — ED Provider Notes (Signed)
[facility] DEPT Provider Note   CSN: [id number] Arrival date & time: 03/02/16  1836     History   Chief Complaint Chief Complaint  Patient presents with  . Fall  . Shoulder Injury    HPI MAKINZI PRIEUR is a 82 y.o. female. She presents after a fall in her home. She normally walks with a walker. She was walking with her walker and her hand slipped off of that she fell. She complains of pain in her left shoulder left ribs left wrist and left elbow.  She was able to crawl to her phone and called her neighbors.  She lives alone.  She denies any symptoms prior to her fall. No lightheadedness or dizziness no shortness of breath. History of hypertension, acid reflux, arthritis, chronic back pain, previous left-sided breast cancer status post lumpectomy, osteoporosis, thyroid disease  HPI  Past Medical History:  Diagnosis Date  . Acid reflux   . Arthritis   . At risk for falls   . Back pain   . Bilateral ovarian cysts 02/20/2012  . Cancer ([place])   . Edema   . Hypertension   . Left-sided breast cancer 11/14/2011   Started Arimidex on 07/25/2009. Stage IB, grade 1, ER 99%, PR 100%, Her2 negative, left-sided breast cancer, 8 mm in size, S/P lumpectomy on 04/24/2010, and sentinel node biopsy.  Ki-67 marker 6%.  . Osteoporosis 03/06/2012   Switched from Arimidex to Tamoxifen as a result of osteoporosis   . Thyroid disease     Patient Active Problem List   Diagnosis Date Noted  . Iron deficiency anemia 07/08/2014  . Other pancytopenia ([place]) 05/29/2014  . Osteoporosis 03/06/2012  . Bilateral ovarian cysts 02/20/2012  . Left-sided breast cancer 11/14/2011  . HYPOTHYROIDISM 03/10/2007  . HYPERTENSION 03/10/2007  . GERD 03/10/2007    Past Surgical History:  Procedure Laterality Date  . APPENDECTOMY    . BREAST SURGERY    . CHOLECYSTECTOMY    . HERNIA REPAIR    . HIP FRACTURE SURGERY    . TONSILLECTOMY    . TOTAL KNEE REVISION      OB History    No data available       Home Medications    Prior to Admission medications   Medication Sig Start Date End Date Taking? Authorizing Provider  ALPRAZolam Duanne Moron) 0.5 MG tablet Take 0.5 mg by mouth 4 (four) times daily as needed for anxiety.    Historical Provider, MD  aspirin 81 MG tablet Take 81 mg by mouth daily.    Historical Provider, MD  furosemide (LASIX) 40 MG tablet Take 40 mg by mouth daily.  02/23/14   Historical Provider, MD  GARLIC 9373 PO Take by mouth. Reported on 05/26/2015    Historical Provider, MD  HYDROcodone-acetaminophen (NORCO) 10-325 MG per tablet Take 1 tablet by mouth every 6 (six) hours as needed for pain.    Historical Provider, MD  levothyroxine (SYNTHROID, LEVOTHROID) 75 MCG tablet Take 75 mcg by mouth daily before breakfast.    Historical Provider, MD  metoprolol succinate (TOPROL-XL) 50 MG 24 hr tablet Take 50 mg by mouth daily. Take with or immediately following a meal.    Historical Provider, MD  nitroGLYCERIN (NITROSTAT) 0.4 MG SL tablet Place 0.4 mg under the tongue every 5 (five) minutes as needed for chest pain.     Historical Provider, MD  omeprazole (PRILOSEC) 20 MG capsule Take 20 mg by mouth 2 (two) times daily.    Historical Provider, MD  ONE TOUCH ULTRA TEST test strip Test strips for glucose 09/06/14   Historical Provider, MD  potassium chloride SA (K-DUR,KLOR-CON) 20 MEQ tablet Take 20 mEq by mouth daily.    Historical Provider, MD  SSD 1 % cream APPLY TO AFFECTED AREAS THREE TIMES DAILY. 10/10/15   Florian Buff, MD  triamcinolone cream (KENALOG) 0.1 % Apply 1 application topically 2 (two) times daily. Alternates with silvadene    Historical Provider, MD    Family History History reviewed. No pertinent family history.  Social History Social History  Substance Use Topics  . Smoking status: Never Smoker  . Smokeless tobacco: Never Used  . Alcohol use No     Allergies   Ciprofloxacin; Codeine phosphate; Doxycycline; Erythromycin ethylsuccinate; Levofloxacin;  Penicillins; Sulfamethoxazole; Tetracycline hcl; and Lidocaine   Review of Systems Review of Systems  Constitutional: Negative for appetite change, chills, diaphoresis, fatigue and fever.  HENT: Negative for mouth sores, sore throat and trouble swallowing.        No sign of strike to the head. No areas of contusion or tenderness. No tenderness over the midline spine.  Eyes: Negative for visual disturbance.  Respiratory: Negative for cough, chest tightness, shortness of breath and wheezing.   Cardiovascular: Negative for chest pain.       Left-sided chest wall pain  Gastrointestinal: Negative for abdominal distention, abdominal pain, diarrhea, nausea and vomiting.  Endocrine: Negative for polydipsia, polyphagia and polyuria.  Genitourinary: Negative for dysuria, frequency and hematuria.  Musculoskeletal: Negative for gait problem.       Left shoulder, elbow, and wrist pain  Skin: Negative for color change, pallor and rash.  Neurological: Negative for dizziness, syncope, light-headedness and headaches.  Hematological: Does not bruise/bleed easily.  Psychiatric/Behavioral: Negative for behavioral problems and confusion.     Physical Exam Updated Vital Signs BP (!) 146/111   Pulse 73   Temp 98 F (36.7 C) (Oral)   Resp 24   Ht '4\' 8"'  (1.422 m)   Wt 105 lb (47.6 kg)   SpO2 92%   BMI 23.54 kg/m   Physical Exam  Constitutional: She is oriented to person, place, and time. She appears well-developed and well-nourished. No distress.  HENT:  Head: Normocephalic.  No sign of trauma to the head neck or back.  Eyes: Conjunctivae are normal. Pupils are equal, round, and reactive to light. No scleral icterus.  Neck: Normal range of motion. Neck supple. No thyromegaly present.  Cardiovascular: Normal rate and regular rhythm.  Exam reveals no gallop and no friction rub.   No murmur heard. Pulmonary/Chest: Effort normal and breath sounds normal. No respiratory distress. She has no wheezes.  She has no rales.  Tenderness in the left lateral chest wall. No subcutaneous air. Symmetric breath sounds.  Abdominal: Soft. Bowel sounds are normal. She exhibits no distension. There is no tenderness. There is no rebound.  Musculoskeletal: Normal range of motion.  Ecchymosis and aspect of the left shoulder. Good sensation throughout the left upper extremity. Complains of pain near the elbow before range of motion. Some tenderness dorsally over the wrist but no soft tissue swelling or ecchymosis.  Neurological: She is alert and oriented to person, place, and time.  Skin: Skin is warm and dry. No rash noted.  Psychiatric: She has a normal mood and affect. Her behavior is normal.     ED Treatments / Results  Labs (all labs ordered are listed, but only abnormal results are displayed) Labs Reviewed  URINALYSIS, [place]  MICROSCOPIC (NOT AT [facility])  CBC WITH DIFFERENTIAL/PLATELET  BASIC METABOLIC PANEL    EKG  EKG Interpretation None       Radiology Dg Wrist 2 Views Left  Result Date: 03/02/2016 CLINICAL DATA:  [place] today with left shoulder and left wrist pain. EXAM: LEFT WRIST - 2 VIEW COMPARISON:  None. FINDINGS: Plate and screw fixation of an old fracture deformity of the distal left radius. Old ununited ossicle over the left ulnar styloid process. Hardware components appear well seated. No acute fracture or dislocation is identified. Degenerative changes in the intercarpal joints. Mild dorsal soft tissue swelling. IMPRESSION: Old fracture deformities of the distal left radius and ulna with plate and screw fixation of the radius. Degenerative changes in the wrist. No acute bony abnormalities. Electronically Signed   By: Lucienne Capers M.D.   On: 03/02/2016 19:51   Dg Shoulder Left  Result Date: 03/02/2016 CLINICAL DATA:  [place] today with left shoulder and left wrist pain. EXAM: LEFT SHOULDER - 2+ VIEW COMPARISON:  None. FINDINGS: Transverse fractures across the left humeral  neck with impaction of fracture fragments. Fracture lines may extend to the greater tuberosity. No dislocation at the glenohumeral joint. Fracture of the left anterior rib. Possible linear infiltration or atelectasis in the left lung. IMPRESSION: Transverse impacted fracture of the left humeral neck with possible extension to the greater tuberosity. Electronically Signed   By: Lucienne Capers M.D.   On: 03/02/2016 19:50    Procedures Procedures (including critical care time)  Medications Ordered in ED Medications  morphine 4 MG/ML injection 4 mg (4 mg Intravenous Given 03/02/16 2049)  ondansetron (ZOFRAN) injection 4 mg (4 mg Intravenous Given 03/02/16 2048)     Initial Impression / Assessment and Plan / ED Course  I have reviewed the triage vital signs and the nursing notes.  Pertinent labs & imaging results that were available during my care of the patient were reviewed by me and considered in my medical decision making (see chart for details).  Clinical Course    X-ray show 2 left lateral rib fractures. No pneumothorax. Wrist films show intact hardware no fracture. X-rays elbow show no joint effusion or fracture. Humeral neck fracture without dislocation.  Patient lives alone. She has had small doses of pain medicine here which have helped her symptoms. She has had episodes of hypoxemia which are likely related to affective rib fracture, splinting, and pain medication. Recommend admission for pain control and pulmonary toilet. Orthopedics recommends nonsurgical treatment with sling.  She will likely need PT and OT evaluations to determine if she is safe for ultimate discharge home versus rehabilitation facility.  Final Clinical Impressions(s) / ED Diagnoses   Final diagnoses:  Closed fracture of multiple ribs of left side, initial encounter  Closed nondisplaced fracture of surgical neck of left humerus, unspecified fracture morphology, initial encounter    New Prescriptions New  Prescriptions   No medications on file     Tanna Furry, MD 03/02/16 2223

## 2016-03-03 DIAGNOSIS — S42215A Unspecified nondisplaced fracture of surgical neck of left humerus, initial encounter for closed fracture: Secondary | ICD-10-CM

## 2016-03-03 DIAGNOSIS — Y92009 Unspecified place in unspecified non-institutional (private) residence as the place of occurrence of the external cause: Secondary | ICD-10-CM

## 2016-03-03 DIAGNOSIS — E038 Other specified hypothyroidism: Secondary | ICD-10-CM

## 2016-03-03 DIAGNOSIS — S2249XA Multiple fractures of ribs, unspecified side, initial encounter for closed fracture: Secondary | ICD-10-CM | POA: Diagnosis present

## 2016-03-03 DIAGNOSIS — I10 Essential (primary) hypertension: Secondary | ICD-10-CM

## 2016-03-03 DIAGNOSIS — R0902 Hypoxemia: Secondary | ICD-10-CM

## 2016-03-03 DIAGNOSIS — D509 Iron deficiency anemia, unspecified: Secondary | ICD-10-CM

## 2016-03-03 DIAGNOSIS — S2242XA Multiple fractures of ribs, left side, initial encounter for closed fracture: Secondary | ICD-10-CM

## 2016-03-03 DIAGNOSIS — W19XXXA Unspecified fall, initial encounter: Secondary | ICD-10-CM

## 2016-03-03 LAB — GLUCOSE, CAPILLARY
Glucose-Capillary: 147 mg/dL — ABNORMAL HIGH (ref 65–99)
Glucose-Capillary: 112 mg/dL — ABNORMAL HIGH (ref 65–99)

## 2016-03-03 LAB — IRON AND TIBC
IRON: 19 ug/dL — AB (ref 28–170)
SATURATION RATIOS: 7 % — AB (ref 10.4–31.8)
TIBC: 262 ug/dL (ref 250–450)
UIBC: 243 ug/dL

## 2016-03-03 LAB — BASIC METABOLIC PANEL
ANION GAP: 4 — AB (ref 5–15)
BUN: 24 mg/dL — ABNORMAL HIGH (ref 6–20)
CALCIUM: 8.6 mg/dL — AB (ref 8.9–10.3)
CO2: 29 mmol/L (ref 22–32)
Chloride: 104 mmol/L (ref 101–111)
Creatinine, Ser: 0.74 mg/dL (ref 0.44–1.00)
GFR calc non Af Amer: >60 mL/min (ref >60)
Glucose, Bld: 161 mg/dL — ABNORMAL HIGH (ref 65–99)
Potassium: 4.7 mmol/L (ref 3.5–5.1)
Sodium: 137 mmol/L (ref 135–145)

## 2016-03-03 LAB — URINALYSIS, ROUTINE W REFLEX MICROSCOPIC
GLUCOSE, UA: NEGATIVE mg/dL
HGB URINE DIPSTICK: NEGATIVE
Nitrite: NEGATIVE
PROTEIN: 100 mg/dL — AB
pH: 5.5 (ref 5.0–8.0)

## 2016-03-03 LAB — URINE MICROSCOPIC-ADD ON

## 2016-03-03 LAB — VITAMIN B12: Vitamin B-12: 159 pg/mL — ABNORMAL LOW (ref 180–914)

## 2016-03-03 LAB — FERRITIN: Ferritin: 110 ng/mL (ref 11–307)

## 2016-03-03 LAB — PROTIME-INR
INR: 1.22
Prothrombin Time: 15.4 seconds — ABNORMAL HIGH (ref 11.4–15.2)

## 2016-03-03 LAB — CK: CK TOTAL: 96 U/L (ref 38–234)

## 2016-03-03 MED ORDER — SODIUM CHLORIDE 0.9 % IV SOLN
INTRAVENOUS | Status: AC
  Administered 2016-03-03 (×2): via INTRAVENOUS

## 2016-03-03 MED ORDER — METOPROLOL SUCCINATE ER 50 MG PO TB24
50.0000 mg | ORAL_TABLET | Freq: Every day | ORAL | Status: DC
  Administered 2016-03-03: 50 mg via ORAL
  Filled 2016-03-03 (×2): qty 1

## 2016-03-03 MED ORDER — ALPRAZOLAM 0.25 MG PO TABS
0.2500 mg | ORAL_TABLET | Freq: Three times a day (TID) | ORAL | Status: DC | PRN
  Administered 2016-03-03: 0.25 mg via ORAL
  Filled 2016-03-03: qty 1

## 2016-03-03 MED ORDER — ACETAMINOPHEN 650 MG RE SUPP: 650.0000 mg | Freq: Four times a day (QID) | RECTAL | Status: DC | PRN

## 2016-03-03 MED ORDER — ONDANSETRON HCL 4 MG PO TABS: 4.0000 mg | ORAL_TABLET | Freq: Four times a day (QID) | ORAL | Status: DC | PRN

## 2016-03-03 MED ORDER — POLYETHYLENE GLYCOL 3350 17 G PO PACK: 17.0000 g | PACK | Freq: Every day | ORAL | Status: DC | PRN

## 2016-03-03 MED ORDER — BISACODYL 5 MG PO TBEC
5.0000 mg | DELAYED_RELEASE_TABLET | Freq: Every day | ORAL | Status: DC | PRN
Start: 2016-03-03 — End: 2016-03-05

## 2016-03-03 MED ORDER — PANTOPRAZOLE SODIUM 40 MG PO TBEC
40.0000 mg | DELAYED_RELEASE_TABLET | Freq: Every day | ORAL | Status: DC
  Administered 2016-03-03 – 2016-03-05 (×3): 40 mg via ORAL
  Filled 2016-03-03 (×3): qty 1

## 2016-03-03 MED ORDER — ACETAMINOPHEN 325 MG PO TABS: 650.0000 mg | ORAL_TABLET | Freq: Four times a day (QID) | ORAL | Status: DC | PRN

## 2016-03-03 MED ORDER — HYDROCODONE-ACETAMINOPHEN 10-325 MG PO TABS
1.0000 | ORAL_TABLET | Freq: Four times a day (QID) | ORAL | Status: DC | PRN
  Administered 2016-03-03 – 2016-03-05 (×5): 1 via ORAL
  Filled 2016-03-03 (×5): qty 1

## 2016-03-03 MED ORDER — LEVOTHYROXINE SODIUM 75 MCG PO TABS
75.0000 ug | ORAL_TABLET | Freq: Every day | ORAL | Status: DC
  Administered 2016-03-03 – 2016-03-05 (×3): 75 ug via ORAL
  Filled 2016-03-03 (×3): qty 1

## 2016-03-03 MED ORDER — CEFTRIAXONE SODIUM 1 G IJ SOLR
1.0000 g | INTRAMUSCULAR | Status: DC
  Administered 2016-03-03 – 2016-03-04 (×2): 1 g via INTRAVENOUS
  Filled 2016-03-03 (×4): qty 10

## 2016-03-03 MED ORDER — SODIUM CHLORIDE 0.9 % IV SOLN: INTRAVENOUS | Status: DC

## 2016-03-03 MED ORDER — ASPIRIN EC 81 MG PO TBEC
81.0000 mg | DELAYED_RELEASE_TABLET | Freq: Every day | ORAL | Status: DC
  Administered 2016-03-03 – 2016-03-04 (×2): 81 mg via ORAL
  Filled 2016-03-03 (×3): qty 1

## 2016-03-03 MED ORDER — ONDANSETRON HCL 4 MG/2ML IJ SOLN
4.0000 mg | Freq: Four times a day (QID) | INTRAMUSCULAR | Status: DC | PRN
  Administered 2016-03-03: 4 mg via INTRAVENOUS
  Filled 2016-03-03: qty 2

## 2016-03-03 MED ORDER — HYDROMORPHONE HCL 1 MG/ML IJ SOLN
0.5000 mg | INTRAMUSCULAR | Status: DC | PRN
  Administered 2016-03-03 (×2): 1 mg via INTRAVENOUS
  Filled 2016-03-03 (×2): qty 1

## 2016-03-03 NOTE — Care Management Note (Signed)
Case Management Note  Patient Details  Name: Ashley [name] MRN: [id number] Date of Birth: Aug 04, 1931  Subjective/Objective:                    Action/Plan: Patient prefers to have home health services for care instead of admitting to a SNF.    Expected Discharge Date:                  Expected Discharge Plan:  [place]  In-House Referral:     Discharge planning Services  CM Consult  Post Acute Care Choice:  Home Health Choice offered to:  Patient  DME Arranged:    DME Agency:     HH Arranged:  RN, PT, OT, Nurse's Aide, Social Work [employer] Agency:  [facility]  Status of Service:  In process, will continue to follow  If discussed at Long Length of Stay Meetings, dates discussed:    Additional Comments:  Briant Sites, RN 03/03/2016, 2:24 PM

## 2016-03-03 NOTE — Progress Notes (Signed)
Patient ordered Incentive Spirometry to be taught and started by RT. When I arrived in patient's room patient was dry heaving and very sick on her stomach. Sat down with patient and made sure she was alright. I explained to her why I was there and what I needed to do. Patient understood but complained about being sick on her stomach and very hot. Turned patient's thermostat down and let RN know that patient wanted something for her stomach. Let patient know the day shift RT would be back when she felt a little better to teach incentive spirometer.

## 2016-03-03 NOTE — H&P (Signed)
History and Physical    Ashley [name] TGG:[id number] DOB: [birth year] DOA: 03/02/2016  PCP: [name]., MD   Patient coming from: Home   Chief Complaint: Left shoulder, wrist, and rib pain after a fall at home   HPI: Ashley [name] is a 82 y.o. female with medical history significant for hypothyroidism, hypertension, GERD, and osteoporosis who presents to the emergency department with acute pain in the left shoulder, left wrist, and left lateral chest wall after a fall at home. Patient lives alone and was in her usual state of health today and ambulating with her walker. She describes bearing weight on the left arm on her walker when the hand slipped off and she fell to the ground. She experienced immediate pain at the left wrist, left shoulder, and left lateral chest wall and was able to make her way over to the phone to call 911. She had felt well just prior to the fall and denies any chest pain, palpitations, dyspnea, or cough. There had been no recent fevers, chills, or lightheadedness. Patient did not hit her head or lose consciousness.   ED Course: Upon arrival to the ED, patient is found to be afebrile, saturating in the 80s on room air, and with vitals otherwise stable. EKG demonstrates sinus rhythm with chronic right bundle branch block. Chemistry panel features and elevation and BUN to creatinine ratio and CBC is notable for a leukocytosis of 14,000, normocytic anemia with hemoglobin 11.6, and worsening in her chronic thrombocytopenia with platelet count 97,000. Radiographs feature a transverse impacted fracture of the left humeral neck, no acute left wrist or elbow injury, and fractures in the left lateral ribs, 7 through 10. Orthopedic surgery was consulted by the ED physician and advised conservative management with a splint for the humerus fracture. Patient was provided symptomatic care with morphine and Zofran in the emergency department, but developed hypoxia and has required  supplemental oxygen in order to maintain adequate saturations. Given her multiple rib fractures, humerus fracture, need for pain control, complicated by patient living alone and with hypoxia, she'll be admitted to the medical/surgical unit.  Review of Systems:  All other systems reviewed and apart from HPI, are negative.  Past Medical History:  Diagnosis Date  . Acid reflux   . Arthritis   . At risk for falls   . Back pain   . Bilateral ovarian cysts 02/20/2012  . Cancer ([place])   . Edema   . Hypertension   . Left-sided breast cancer 11/14/2011   Started Arimidex on 07/25/2009. Stage IB, grade 1, ER 99%, PR 100%, Her2 negative, left-sided breast cancer, 8 mm in size, S/P lumpectomy on 04/24/2010, and sentinel node biopsy.  Ki-67 marker 6%.  . Osteoporosis 03/06/2012   Switched from Arimidex to Tamoxifen as a result of osteoporosis   . Thyroid disease     Past Surgical History:  Procedure Laterality Date  . APPENDECTOMY    . BREAST SURGERY    . CHOLECYSTECTOMY    . HERNIA REPAIR    . HIP FRACTURE SURGERY    . TONSILLECTOMY    . TOTAL KNEE REVISION       reports that she has never smoked. She has never used smokeless tobacco. She reports that she does not drink alcohol or use drugs.  Allergies  Allergen Reactions  . Ciprofloxacin Hives and Itching  . Codeine Phosphate Other (See Comments)    Reaction:  Unknown   . Doxycycline Hives and Itching  . Erythromycin Ethylsuccinate  Other (See Comments)    Reaction:  Unknown   . Levofloxacin Other (See Comments)    Reaction:  Unknown   . Penicillins Other (See Comments)    Reaction:  Unknown  Has patient had a PCN reaction causing immediate rash, facial/tongue/throat swelling, SOB or lightheadedness with hypotension: Unsure Has patient had a PCN reaction causing severe rash involving mucus membranes or skin necrosis: Unsure Has patient had a PCN reaction that required hospitalization Unsure Has patient had a PCN reaction occurring  within the last 10 years: No If all of the above answers are "NO", then may proceed with Cephalosporin use.  . Sulfamethoxazole Other (See Comments)    Reaction:  Unknown   . Tetracycline Hcl Other (See Comments)    Reaction:  Unknown   . Lidocaine Other (See Comments)    Reaction:  Unknown     History reviewed. No pertinent family history.   Prior to Admission medications   Medication Sig Start Date End Date Taking? Authorizing Provider  ALPRAZolam Duanne Moron) 0.5 MG tablet Take 0.25-0.5 mg by mouth 3 (three) times daily as needed for anxiety.    Yes Historical Provider, MD  aspirin EC 81 MG tablet Take 81 mg by mouth daily with lunch.   Yes Historical Provider, MD  HYDROcodone-acetaminophen (NORCO) 10-325 MG per tablet Take 1 tablet by mouth every 6 (six) hours as needed for moderate pain.    Yes Historical Provider, MD  levothyroxine (SYNTHROID, LEVOTHROID) 75 MCG tablet Take 75 mcg by mouth daily before breakfast.   Yes Historical Provider, MD  metoprolol succinate (TOPROL-XL) 50 MG 24 hr tablet Take 50 mg by mouth daily. Take with or immediately following a meal.   Yes Historical Provider, MD  nitroGLYCERIN (NITROSTAT) 0.4 MG SL tablet Place 0.4 mg under the tongue every 5 (five) minutes as needed for chest pain.    Yes Historical Provider, MD  omeprazole (PRILOSEC) 20 MG capsule Take 20 mg by mouth 2 (two) times daily as needed (for acid reflux).    Yes Historical Provider, MD  potassium chloride SA (K-DUR,KLOR-CON) 20 MEQ tablet Take 20 mEq by mouth daily.   Yes Historical Provider, MD  silver sulfADIAZINE (SSD) 1 % cream Apply 1 application topically 2 (two) times daily. Pt applies to legs.   Yes Historical Provider, MD  triamcinolone cream (KENALOG) 0.1 % Apply 1 application topically 2 (two) times daily. Pt applies to legs.   Yes Historical Provider, MD    Physical Exam: Vitals:   03/02/16 2100 03/02/16 2218 03/02/16 2230 03/02/16 2339  BP: 94/79 (!) 146/111 111/100 (!) 101/58    Pulse:  73 72 75  Resp: (!) _0 Temp:    98 F (36.7 C)  TempSrc:    Oral  SpO2: 92% 92% 100% 96%  Weight:    48.8 kg (107 lb 9.4 oz)  Height:          Constitutional: NAD, calm, in apparent discomfort  Eyes: PERTLA, lids and conjunctivae normal ENMT: Mucous membranes are dry. Posterior pharynx clear of any exudate or lesions.   Neck: normal, supple, no masses, no thyromegaly Respiratory: clear to auscultation bilaterally, no wheezing, no crackles. Normal respiratory effort.    Cardiovascular: S1 & S2 heard, regular rate and rhythm. No extremity edema. No significant JVD. Abdomen: No distension, no tenderness, no masses palpated. Bowel sounds normal.  Musculoskeletal: no clubbing / cyanosis. Ecchymoses at left wrist and shoulder with exquisite tenderness at proximal left humerus; neurovascularly intact  in left hand.    Skin: no significant rashes, lesions, ulcers. Warm, dry, well-perfused. Neurologic: CN 2-12 grossly intact. Sensation intact, DTR normal. Strength 5/5 in all 4 limbs.  Psychiatric: Normal judgment and insight. Alert and oriented x 3. Normal mood and affect.     Labs on Admission: I have personally reviewed following labs and imaging studies  CBC:  Recent Labs Lab 03/02/16 2227  WBC 14.0*  NEUTROABS 12.3*  HGB 11.6*  HCT 37.7  MCV 95.7  PLT 97*   Basic Metabolic Panel:  Recent Labs Lab 03/02/16 2227  NA 138  K 5.0  CL 103  CO2 28  GLUCOSE 169*  BUN 21*  CREATININE 0.75  CALCIUM 9.3   GFR: Estimated Creatinine Clearance: 35.3 mL/min (by C-G formula based on SCr of 0.75 mg/dL). Liver Function Tests: No results for input(s): AST, ALT, ALKPHOS, BILITOT, PROT, ALBUMIN in the last 168 hours. No results for input(s): LIPASE, AMYLASE in the last 168 hours. No results for input(s): AMMONIA in the last 168 hours. Coagulation Profile: No results for input(s): INR, PROTIME in the last 168 hours. Cardiac Enzymes: No results for input(s):  CKTOTAL, CKMB, CKMBINDEX, TROPONINI in the last 168 hours. BNP (last 3 results) No results for input(s): PROBNP in the last 8760 hours. HbA1C: No results for input(s): HGBA1C in the last 72 hours. CBG: No results for input(s): GLUCAP in the last 168 hours. Lipid Profile: No results for input(s): CHOL, HDL, LDLCALC, TRIG, CHOLHDL, LDLDIRECT in the last 72 hours. Thyroid Function Tests: No results for input(s): TSH, T4TOTAL, FREET4, T3FREE, THYROIDAB in the last 72 hours. Anemia Panel: No results for input(s): VITAMINB12, FOLATE, FERRITIN, TIBC, IRON, RETICCTPCT in the last 72 hours. Urine analysis:    Component Value Date/Time   COLORURINE YELLOW 12/26/2012 1100   APPEARANCEUR CLOUDY (A) 12/26/2012 1100   LABSPEC >1.030 (H) 12/26/2012 1100   PHURINE 5.0 12/26/2012 1100   GLUCOSEU NEGATIVE 12/26/2012 1100   HGBUR TRACE (A) 12/26/2012 1100   BILIRUBINUR SMALL (A) 12/26/2012 1100   KETONESUR TRACE (A) 12/26/2012 1100   PROTEINUR TRACE (A) 12/26/2012 1100   UROBILINOGEN 0.2 12/26/2012 1100   NITRITE NEGATIVE 12/26/2012 1100   LEUKOCYTESUR NEGATIVE 12/26/2012 1100   Sepsis Labs: '@LABRCNTIP'$ (procalcitonin:4,lacticidven:4) )No results found for this or any previous visit (from the past 240 hour(s)).   Radiological Exams on Admission: Dg Ribs Unilateral W/chest Left  Result Date: 03/02/2016 CLINICAL DATA:  Status post fall, left rib the cage pain. EXAM: LEFT RIBS AND CHEST - 3+ VIEW COMPARISON:  None. FINDINGS: There several mildly displaced left-sided rib fractures, probably 7, 8, 9 and tenth ribs. There is no evidence of pneumothorax. Elevation of the left hemidiaphragm and probable large hiatal hernia are are noted. IMPRESSION: Left lateral rib fractures. No evidence of pneumothorax. Electronically Signed   By: Fidela Salisbury M.D.   On: 03/02/2016 22:28   Dg Elbow Complete Left  Result Date: 03/02/2016 CLINICAL DATA:  Status post fall, with left elbow pain. Initial encounter.  EXAM: LEFT ELBOW - COMPLETE 3+ VIEW COMPARISON:  None. FINDINGS: There is no evidence of fracture or dislocation. The visualized joint spaces are preserved. No significant joint effusion is identified. The soft tissues are unremarkable in appearance. IMPRESSION: No evidence of fracture or dislocation. Electronically Signed   By: Garald Balding M.D.   On: 03/02/2016 22:25   Dg Wrist 2 Views Left  Result Date: 03/02/2016 CLINICAL DATA:  [place] today with left shoulder and left wrist pain. EXAM: LEFT  WRIST - 2 VIEW COMPARISON:  None. FINDINGS: Plate and screw fixation of an old fracture deformity of the distal left radius. Old ununited ossicle over the left ulnar styloid process. Hardware components appear well seated. No acute fracture or dislocation is identified. Degenerative changes in the intercarpal joints. Mild dorsal soft tissue swelling. IMPRESSION: Old fracture deformities of the distal left radius and ulna with plate and screw fixation of the radius. Degenerative changes in the wrist. No acute bony abnormalities. Electronically Signed   By: Lucienne Capers M.D.   On: 03/02/2016 19:51   Dg Shoulder Left  Result Date: 03/02/2016 CLINICAL DATA:  [place] today with left shoulder and left wrist pain. EXAM: LEFT SHOULDER - 2+ VIEW COMPARISON:  None. FINDINGS: Transverse fractures across the left humeral neck with impaction of fracture fragments. Fracture lines may extend to the greater tuberosity. No dislocation at the glenohumeral joint. Fracture of the left anterior rib. Possible linear infiltration or atelectasis in the left lung. IMPRESSION: Transverse impacted fracture of the left humeral neck with possible extension to the greater tuberosity. Electronically Signed   By: Lucienne Capers M.D.   On: 03/02/2016 19:50    EKG: Independently reviewed. Sinus rhythm, chronic RBBB  Assessment/Plan  1. Fall at home fractures of left humerus and multiple left ribs - Pt suffered a mechanical fall at home  just PTA with injuries described in HPI  - Orthopedic surgery has recommended conservative treatment of the humerus fx with sling  - There is no PTX associated with the rib fractures, but pt is in considerable pain with deep inspiration and has developed hypoxia, necessitating admission  - Continue symptomatic care with prn analgesia, arm sling - Incentive spirometry; supplemental O2 prn  - PT & OT evals requested  2. Hypoxia  - Suspected secondary to the rib fractures and pain with breathing  - Pain-control with prn analgesics  - Incentive spirometry q2h wa  - Supplemental O2 prn    3. Hypertension  - At goal currently, will continue Toprol as tolerated  4. Hypothyroidism  - Appears to be stable, will continue Synthroid    5. Normocytic anemia, thrombocytopenia  - Hgb 11.6 on admission, down from recent prior in 13-range   - Platelets 97,000 on admission, a little lower than priors  - No s/s of active bleeding  - Check iron studies, B12, and folate; supplement prn     DVT prophylaxis: SCD's  Code Status: Full  Family Communication: Discussed with patient Disposition Plan: Admit to med-surg  Consults called: Orthopedic surgery Admission status: Inpatient    Vianne Bulls, MD [facility] Pager [phone]  If 7PM-7AM, please contact night-coverage [email or web address] Password [email or web address]  03/03/2016, 12:02 AM

## 2016-03-03 NOTE — Progress Notes (Signed)
CM able to fax order and H&P to [place].

## 2016-03-03 NOTE — Progress Notes (Signed)
PROGRESS NOTE                                                                                                                                                                                                             Patient Demographics:    Ashley [name], is a 82 y.o. female, DOB - 02/03/1932, [id number]  Admit date - 03/02/2016   Admitting Physician Vianne Bulls, MD  Outpatient Primary MD for the patient is [name]., MD  LOS - 1  Chief Complaint  Patient presents with  . Fall  . Shoulder Injury       Brief Narrative  Ashley [name] is a 82 y.o. female with medical history significant for hypothyroidism, hypertension, GERD, and osteoporosis who presents to the emergency department with acute pain in the left shoulder, left wrist, and left lateral chest wall after a fall at home, came to the ER where she was diagnosed with left humerus fracture, UTI or fractures and admitted to the hospital.   Subjective:    Ashley [name] today has, No headache, No chest pain, No abdominal pain - No Nausea, No new weakness tingling or numbness, No Cough - SOB.     Assessment  & Plan :     1. Mechanical Fall with fracture of left humerus and multiple left ribs - ED physician had discussed with orthopedic surgeon on call, left arm placed in sling per orthopedic surgeon with outpatient follow-up, continue incentive spirometer 3-4 times every hour while awake for rib fractures, patient counseled by myself. PTOT evaluation, may require placement.  2. UTI. Rocephin 3 days, follow cultures.  3. Medical mild atelectasis. Due to rib fractures, continue IS.  4. Hypothyroidism. Continue Synthroid.  5. Normocytic anemia and mild thrombocytopenia. Iron panel, monitor. Anemia panel pending, No signs of bleeding.    Family Communication  :  None  Code Status :  Full  Diet : Heart Healthy  Disposition Plan  :   TBD  Consults  :  Orthopedic surgery  Procedures  :     DVT Prophylaxis  :   SCDs    Lab Results  Component Value Date   PLT 97 (L) 03/02/2016    Inpatient Medications  Scheduled Meds: . aspirin EC  81 mg Oral Q lunch  . cefTRIAXone (ROCEPHIN)  IV  1 g  Intravenous Q24H  . levothyroxine  75 mcg Oral QAC breakfast  . metoprolol succinate  50 mg Oral Daily  . pantoprazole  40 mg Oral Daily   Continuous Infusions:  PRN Meds:.acetaminophen **OR** acetaminophen, ALPRAZolam, bisacodyl, HYDROcodone-acetaminophen, HYDROmorphone (DILAUDID) injection, ondansetron **OR** ondansetron (ZOFRAN) IV, polyethylene glycol  Antibiotics  :    Anti-infectives    Start     Dose/Rate Route Frequency Ordered Stop   03/03/16 1000  cefTRIAXone (ROCEPHIN) 1 g in dextrose 5 % 50 mL IVPB     1 g 100 mL/hr over 30 Minutes Intravenous Every 24 hours 03/03/16 0746 03/06/16 0959         Objective:   Vitals:   03/02/16 2230 03/02/16 2339 03/03/16 0514 03/03/16 0957  BP: 111/100 (!) 101/58 124/61 (!) 111/56  Pulse: 72 75 80 87  Resp: 20  17 17   Temp:  98 F (36.7 C) 97.6 F (36.4 C)   TempSrc:  Oral Oral   SpO2: 100% 96% 96% 100%  Weight:  48.8 kg (107 lb 9.4 oz) 48.9 kg (107 lb 12.9 oz)   Height:        Wt Readings from Last 3 Encounters:  03/03/16 48.9 kg (107 lb 12.9 oz)  01/17/16 49.4 kg (108 lb 14.4 oz)  07/26/15 49 kg (108 lb)     Intake/Output Summary (Last 24 hours) at 03/03/16 1026 Last data filed at 03/03/16 0957  Gross per 24 hour  Intake             1165 ml  Output                0 ml  Net             1165 ml     Physical Exam  Awake Alert, Oriented X 3, No new F.N deficits, Normal affect [place].AT,PERRAL Supple Neck,No JVD, No cervical lymphadenopathy appriciated.  Symmetrical Chest wall movement, Good air movement bilaterally, CTAB RRR,No Gallops,Rubs or new Murmurs, No Parasternal Heave +ve B.Sounds, Abd Soft, No tenderness, No organomegaly appriciated, No rebound -  guarding or rigidity. No Cyanosis, Clubbing or edema, No new Rash or bruise , L arm in sling     Data Review:    CBC  Recent Labs Lab 03/02/16 2227  WBC 14.0*  HGB 11.6*  HCT 37.7  PLT 97*  MCV 95.7  MCH 29.4  MCHC 30.8  RDW 13.8  LYMPHSABS 0.9  MONOABS 0.8  EOSABS 0.0  BASOSABS 0.0    Chemistries   Recent Labs Lab 03/02/16 2227 03/03/16 0627  NA 138 137  K 5.0 4.7  CL 103 104  CO2 28 29  GLUCOSE 169* 161*  BUN 21* 24*  CREATININE 0.75 0.74  CALCIUM 9.3 8.6*   ------------------------------------------------------------------------------------------------------------------ No results for input(s): CHOL, HDL, LDLCALC, TRIG, CHOLHDL, LDLDIRECT in the last 72 hours.  No results found for: HGBA1C ------------------------------------------------------------------------------------------------------------------ No results for input(s): TSH, T4TOTAL, T3FREE, THYROIDAB in the last 72 hours.  Invalid input(s): FREET3 ------------------------------------------------------------------------------------------------------------------ No results for input(s): VITAMINB12, FOLATE, FERRITIN, TIBC, IRON, RETICCTPCT in the last 72 hours.  Coagulation profile  Recent Labs Lab 03/02/16 2227  INR 1.22    No results for input(s): DDIMER in the last 72 hours.  Cardiac Enzymes No results for input(s): CKMB, TROPONINI, MYOGLOBIN in the last 168 hours.  Invalid input(s): CK ------------------------------------------------------------------------------------------------------------------ No results found for: BNP  Micro Results No results found for this or any previous visit (from the past 240 hour(s)).  Radiology Reports Dg  Ribs Unilateral W/chest Left  Result Date: 03/02/2016 CLINICAL DATA:  Status post fall, left rib the cage pain. EXAM: LEFT RIBS AND CHEST - 3+ VIEW COMPARISON:  None. FINDINGS: There several mildly displaced left-sided rib fractures, probably 7, 8,  9 and tenth ribs. There is no evidence of pneumothorax. Elevation of the left hemidiaphragm and probable large hiatal hernia are are noted. IMPRESSION: Left lateral rib fractures. No evidence of pneumothorax. Electronically Signed   By: Fidela Salisbury M.D.   On: 03/02/2016 22:28   Dg Elbow Complete Left  Result Date: 03/02/2016 CLINICAL DATA:  Status post fall, with left elbow pain. Initial encounter. EXAM: LEFT ELBOW - COMPLETE 3+ VIEW COMPARISON:  None. FINDINGS: There is no evidence of fracture or dislocation. The visualized joint spaces are preserved. No significant joint effusion is identified. The soft tissues are unremarkable in appearance. IMPRESSION: No evidence of fracture or dislocation. Electronically Signed   By: Garald Balding M.D.   On: 03/02/2016 22:25   Dg Wrist 2 Views Left  Result Date: 03/02/2016 CLINICAL DATA:  [place] today with left shoulder and left wrist pain. EXAM: LEFT WRIST - 2 VIEW COMPARISON:  None. FINDINGS: Plate and screw fixation of an old fracture deformity of the distal left radius. Old ununited ossicle over the left ulnar styloid process. Hardware components appear well seated. No acute fracture or dislocation is identified. Degenerative changes in the intercarpal joints. Mild dorsal soft tissue swelling. IMPRESSION: Old fracture deformities of the distal left radius and ulna with plate and screw fixation of the radius. Degenerative changes in the wrist. No acute bony abnormalities. Electronically Signed   By: Lucienne Capers M.D.   On: 03/02/2016 19:51   Dg Shoulder Left  Result Date: 03/02/2016 CLINICAL DATA:  [place] today with left shoulder and left wrist pain. EXAM: LEFT SHOULDER - 2+ VIEW COMPARISON:  None. FINDINGS: Transverse fractures across the left humeral neck with impaction of fracture fragments. Fracture lines may extend to the greater tuberosity. No dislocation at the glenohumeral joint. Fracture of the left anterior rib. Possible linear infiltration or  atelectasis in the left lung. IMPRESSION: Transverse impacted fracture of the left humeral neck with possible extension to the greater tuberosity. Electronically Signed   By: Lucienne Capers M.D.   On: 03/02/2016 19:50    Time Spent in minutes  30   Mazelle Huebert K M.D on 03/03/2016 at 10:26 AM  Between 7am to 7pm - Pager - [phone]  After 7pm go to [email or web address] - password [facility]  [facility] -  Office  [phone]

## 2016-03-04 DIAGNOSIS — S2231XB Fracture of one rib, right side, initial encounter for open fracture: Secondary | ICD-10-CM

## 2016-03-04 DIAGNOSIS — S42225A 2-part nondisplaced fracture of surgical neck of left humerus, initial encounter for closed fracture: Secondary | ICD-10-CM

## 2016-03-04 LAB — FOLATE RBC
Folate, Hemolysate: 286 ng/mL
Folate, RBC: 923 ng/mL (ref >498)
HEMATOCRIT: 31 % — AB (ref 34.0–46.6)

## 2016-03-04 LAB — GLUCOSE, CAPILLARY: GLUCOSE-CAPILLARY: 96 mg/dL (ref 65–99)

## 2016-03-04 MED ORDER — METOPROLOL SUCCINATE ER 25 MG PO TB24
25.0000 mg | ORAL_TABLET | Freq: Every day | ORAL | Status: DC
  Administered 2016-03-05: 25 mg via ORAL
  Filled 2016-03-04: qty 1

## 2016-03-04 NOTE — Consult Note (Signed)
Reason for Consult: Left proximal humerus fracture Referring Physician: Dr. Ronnie Derby  Ashley [name] is an 82 y.o. female.  HPI: 82 year old female fell mechanical fall injured her left shoulder sustained a proximal impacted left humerus fracture. Complains of mild discomfort no deformity mild bruising mild swelling and loss of motion in the left shoulder  Past Medical History:  Diagnosis Date  . Acid reflux   . Arthritis   . At risk for falls   . Back pain   . Bilateral ovarian cysts 02/20/2012  . Cancer ([place])   . Edema   . Hypertension   . Left-sided breast cancer 11/14/2011   Started Arimidex on 07/25/2009. Stage IB, grade 1, ER 99%, PR 100%, Her2 negative, left-sided breast cancer, 8 mm in size, S/P lumpectomy on 04/24/2010, and sentinel node biopsy.  Ki-67 marker 6%.  . Osteoporosis 03/06/2012   Switched from Arimidex to Tamoxifen as a result of osteoporosis   . Thyroid disease     Past Surgical History:  Procedure Laterality Date  . APPENDECTOMY    . BREAST SURGERY    . CHOLECYSTECTOMY    . HERNIA REPAIR    . HIP FRACTURE SURGERY    . TONSILLECTOMY    . TOTAL KNEE REVISION      Family history not known by patient  Social History:  reports that she has never smoked. She has never used smokeless tobacco. She reports that she does not drink alcohol or use drugs.  Allergies:  Allergies  Allergen Reactions  . Ciprofloxacin Hives and Itching  . Codeine Phosphate Other (See Comments)    Reaction:  Unknown   . Doxycycline Hives and Itching  . Erythromycin Ethylsuccinate Other (See Comments)    Reaction:  Unknown   . Levofloxacin Other (See Comments)    Reaction:  Unknown   . Penicillins Other (See Comments)    Reaction:  Unknown  Has patient had a PCN reaction causing immediate rash, facial/tongue/throat swelling, SOB or lightheadedness with hypotension: Unsure Has patient had a PCN reaction causing severe rash involving mucus membranes or skin necrosis: Unsure Has  patient had a PCN reaction that required hospitalization Unsure Has patient had a PCN reaction occurring within the last 10 years: No If all of the above answers are "NO", then may proceed with Cephalosporin use.  . Sulfamethoxazole Other (See Comments)    Reaction:  Unknown   . Tetracycline Hcl Other (See Comments)    Reaction:  Unknown   . Lidocaine Other (See Comments)    Reaction:  Unknown     Medications: I have reviewed the patient's current medications.  Results for orders placed or performed during the hospital encounter of 03/02/16 (from the past 48 hour(s))  CBC with Differential/Platelet     Status: Abnormal   Collection Time: 03/02/16 10:27 PM  Result Value Ref Range   WBC 14.0 (H) 4.0 - 10.5 K/uL   RBC 3.94 3.87 - 5.11 MIL/uL   Hemoglobin 11.6 (L) 12.0 - 15.0 g/dL   HCT 37.7 36.0 - 46.0 %   MCV 95.7 78.0 - 100.0 fL   MCH 29.4 26.0 - 34.0 pg   MCHC 30.8 30.0 - 36.0 g/dL   RDW 13.8 11.5 - 15.5 %   Platelets 97 (L) 150 - 400 K/uL    Comment: SPECIMEN CHECKED FOR CLOTS   Neutrophils Relative % 88 %   Neutro Abs 12.3 (H) 1.7 - 7.7 K/uL   Lymphocytes Relative 6 %   Lymphs Abs 0.9  0.7 - 4.0 K/uL   Monocytes Relative 6 %   Monocytes Absolute 0.8 0.1 - 1.0 K/uL   Eosinophils Relative 0 %   Eosinophils Absolute 0.0 0.0 - 0.7 K/uL   Basophils Relative 0 %   Basophils Absolute 0.0 0.0 - 0.1 K/uL  Basic metabolic panel     Status: Abnormal   Collection Time: 03/02/16 10:27 PM  Result Value Ref Range   Sodium 138 135 - 145 mmol/L   Potassium 5.0 3.5 - 5.1 mmol/L   Chloride 103 101 - 111 mmol/L   CO2 28 22 - 32 mmol/L   Glucose, Bld 169 (H) 65 - 99 mg/dL   BUN 21 (H) 6 - 20 mg/dL   Creatinine, Ser 0.75 0.44 - 1.00 mg/dL   Calcium 9.3 8.9 - 10.3 mg/dL   GFR calc non Af Amer >60 >60 mL/min   GFR calc Af Amer >60 >60 mL/min    Comment: (NOTE) The eGFR has been calculated using the CKD EPI equation. This calculation has not been validated in all clinical  situations. eGFR's persistently <60 mL/min signify possible Chronic Kidney Disease.    Anion gap 7 5 - 15  Protime-INR     Status: Abnormal   Collection Time: 03/02/16 10:27 PM  Result Value Ref Range   Prothrombin Time 15.4 (H) 11.4 - 15.2 seconds   INR 1.22   CK     Status: None   Collection Time: 03/02/16 10:27 PM  Result Value Ref Range   Total CK 96 38 - 234 U/L  Urinalysis, Routine w reflex microscopic (not at [facility])     Status: Abnormal   Collection Time: 03/03/16 12:32 AM  Result Value Ref Range   Color, Urine YELLOW YELLOW   APPearance CLEAR CLEAR   Specific Gravity, Urine >1.030 (H) 1.005 - 1.030   pH 5.5 5.0 - 8.0   Glucose, UA NEGATIVE NEGATIVE mg/dL   Hgb urine dipstick NEGATIVE NEGATIVE   Bilirubin Urine SMALL (A) NEGATIVE   Ketones, ur TRACE (A) NEGATIVE mg/dL   Protein, ur 100 (A) NEGATIVE mg/dL   Nitrite NEGATIVE NEGATIVE   Leukocytes, UA TRACE (A) NEGATIVE  Urine microscopic-add on     Status: Abnormal   Collection Time: 03/03/16 12:32 AM  Result Value Ref Range   Squamous Epithelial / LPF 0-5 (A) NONE SEEN   WBC, UA 6-30 0 - 5 WBC/hpf   RBC / HPF 0-5 0 - 5 RBC/hpf   Bacteria, UA MANY (A) NONE SEEN  Basic metabolic panel     Status: Abnormal   Collection Time: 03/03/16  6:27 AM  Result Value Ref Range   Sodium 137 135 - 145 mmol/L   Potassium 4.7 3.5 - 5.1 mmol/L   Chloride 104 101 - 111 mmol/L   CO2 29 22 - 32 mmol/L   Glucose, Bld 161 (H) 65 - 99 mg/dL   BUN 24 (H) 6 - 20 mg/dL   Creatinine, Ser 0.74 0.44 - 1.00 mg/dL   Calcium 8.6 (L) 8.9 - 10.3 mg/dL   GFR calc non Af Amer >60 >60 mL/min   GFR calc Af Amer >60 >60 mL/min    Comment: (NOTE) The eGFR has been calculated using the CKD EPI equation. This calculation has not been validated in all clinical situations. eGFR's persistently <60 mL/min signify possible Chronic Kidney Disease.    Anion gap 4 (L) 5 - 15  Vitamin B12     Status: Abnormal   Collection Time: 03/03/16  6:27  AM  Result  Value Ref Range   Vitamin B-12 159 (L) 180 - 914 pg/mL    Comment: (NOTE) This assay is not validated for testing neonatal or myeloproliferative syndrome specimens for Vitamin B12 levels. Performed at [facility]   Iron and TIBC     Status: Abnormal   Collection Time: 03/03/16  6:27 AM  Result Value Ref Range   Iron 19 (L) 28 - 170 ug/dL   TIBC 262 250 - 450 ug/dL   Saturation Ratios 7 (L) 10.4 - 31.8 %   UIBC 243 ug/dL    Comment: Performed at [facility]  Ferritin     Status: None   Collection Time: 03/03/16  6:27 AM  Result Value Ref Range   Ferritin 110 11 - 307 ng/mL    Comment: Performed at [facility]  Glucose, capillary     Status: Abnormal   Collection Time: 03/03/16  7:40 AM  Result Value Ref Range   Glucose-Capillary 147 (H) 65 - 99 mg/dL   Comment 1 Notify RN    Comment 2 Document in Chart   Glucose, capillary     Status: Abnormal   Collection Time: 03/03/16  9:08 PM  Result Value Ref Range   Glucose-Capillary 112 (H) 65 - 99 mg/dL   Comment 1 Notify RN    Comment 2 Document in Chart   Glucose, capillary     Status: None   Collection Time: 03/04/16  7:49 AM  Result Value Ref Range   Glucose-Capillary 96 65 - 99 mg/dL    Dg Ribs Unilateral W/chest Left  Result Date: 03/02/2016 CLINICAL DATA:  Status post fall, left rib the cage pain. EXAM: LEFT RIBS AND CHEST - 3+ VIEW COMPARISON:  None. FINDINGS: There several mildly displaced left-sided rib fractures, probably 7, 8, 9 and tenth ribs. There is no evidence of pneumothorax. Elevation of the left hemidiaphragm and probable large hiatal hernia are are noted. IMPRESSION: Left lateral rib fractures. No evidence of pneumothorax. Electronically Signed   By: Fidela Salisbury M.D.   On: 03/02/2016 22:28   Dg Elbow Complete Left  Result Date: 03/02/2016 CLINICAL DATA:  Status post fall, with left elbow pain. Initial encounter. EXAM: LEFT ELBOW - COMPLETE 3+ VIEW COMPARISON:  None. FINDINGS:  There is no evidence of fracture or dislocation. The visualized joint spaces are preserved. No significant joint effusion is identified. The soft tissues are unremarkable in appearance. IMPRESSION: No evidence of fracture or dislocation. Electronically Signed   By: Garald Balding M.D.   On: 03/02/2016 22:25   Dg Wrist 2 Views Left  Result Date: 03/02/2016 CLINICAL DATA:  [place] today with left shoulder and left wrist pain. EXAM: LEFT WRIST - 2 VIEW COMPARISON:  None. FINDINGS: Plate and screw fixation of an old fracture deformity of the distal left radius. Old ununited ossicle over the left ulnar styloid process. Hardware components appear well seated. No acute fracture or dislocation is identified. Degenerative changes in the intercarpal joints. Mild dorsal soft tissue swelling. IMPRESSION: Old fracture deformities of the distal left radius and ulna with plate and screw fixation of the radius. Degenerative changes in the wrist. No acute bony abnormalities. Electronically Signed   By: Lucienne Capers M.D.   On: 03/02/2016 19:51   Dg Shoulder Left  Result Date: 03/02/2016 CLINICAL DATA:  [place] today with left shoulder and left wrist pain. EXAM: LEFT SHOULDER - 2+ VIEW COMPARISON:  None. FINDINGS: Transverse fractures across the left humeral neck  with impaction of fracture fragments. Fracture lines may extend to the greater tuberosity. No dislocation at the glenohumeral joint. Fracture of the left anterior rib. Possible linear infiltration or atelectasis in the left lung. IMPRESSION: Transverse impacted fracture of the left humeral neck with possible extension to the greater tuberosity. Electronically Signed   By: Lucienne Capers M.D.   On: 03/02/2016 19:50    ROS Blood pressure (!) 92/43, pulse 66, temperature 98.4 F (36.9 C), temperature source Oral, resp. rate 17, height _0  (1.422 m), weight 110 lb 14.3 oz (50.3 kg), SpO2 100 %. Physical Exam  Constitutional: She appears well-developed and  well-nourished. No distress.  HENT:  Head: Normocephalic and atraumatic.   Left proximal humerus was tender without deformity. Muscle tone was normal. Elbow wrist and hand range of motion strength and joint stability confirmed by test and palpation. No tenderness was elicited  Neurovascular examination was normal  Right shoulder range of motion stability and strength was normal with no tenderness or deformity  Neurovascular examination was normal  Assessment/Plan: Left proximal humerus fracture impacted nondisplaced  Fracture meets criteria for non-operative treatment  2 weeks and sling. X-ray at 2 weeks  If x-ray show stable fracture general range of motion can be instituted and continued for 6 weeks.  Ashley [name] 03/04/2016, 8:20 AM

## 2016-03-04 NOTE — Clinical Social Work Note (Signed)
Clinical Social Work Assessment  Patient Details  Name: Ashley [name] MRN: [id number] Date of Birth: Dec 15, 1931  Date of referral:  03/04/16               Reason for consult:  Discharge Planning                Permission sought to share information with:    Permission granted to share information::     Name::        Agency::     Relationship::     Contact Information:     Housing/Transportation Living arrangements for the past 2 months:  Single Family Home Source of Information:  Patient Patient Interpreter Needed:  None Criminal Activity/Legal Involvement Pertinent to Current Situation/Hospitalization:  No - Comment as needed Significant Relationships:  Friend Lives with:  Self Do you feel safe going back to the place where you live?  Yes Need for family participation in patient care:  No (Coment)  Care giving concerns:  Pt lives alone and is not at baseline.    Social Worker assessment / plan:  CSW met with pt at bedside. Pt alert and oriented and reports she lives alone. She states she fell at home and slid on the floor to get to the phone. At baseline, pt ambulates with a walker. She has a cousin down the road, but states they have no relationship. She has no children and her siblings, nieces, and nephews are all out of state. Pt's best local support is two friends. One checks on her daily and the other takes her to the grocery store and appointments. Pt generally manages okay at home and reports she fixes her own meals. PT evaluated pt and recommend SNF. CSW discussed placement process and provided SNF list. Pt is very hesitant to commit to anything. She states, "I don't want to go anywhere." However, she acknowledges that she doesn't have much choice.   Employment status:  Retired Nurse, adult PT Recommendations:  [place] / Referral to community resources:  [place]  Patient/Family's Response to care:   If necessary, pt requests SNF in [place].   Patient/Family's Understanding of and Emotional Response to Diagnosis, Current Treatment, and Prognosis:  Pt will consider all options for d/c. She is agreeable to SNF bed search, but makes it clear that the decision will be hers. CSW agreed and reassured pt of right to self determination.   Emotional Assessment Appearance:  Appears stated age Attitude/Demeanor/Rapport:  Other (Cooperative) Affect (typically observed):  Appropriate Orientation:  Oriented to Self, Oriented to Place, Oriented to  Time, Oriented to Situation Alcohol / Substance use:  Not Applicable Psych involvement (Current and /or in the community):  No (Comment)  Discharge Needs  Concerns to be addressed:  Discharge Planning Concerns Readmission within the last 30 days:  No Current discharge risk:  Lives alone, Physical Impairment Barriers to Discharge:  Continued Medical Work up   [employer], [employer], [place] 03/04/2016, 2:50 PM [phone]

## 2016-03-04 NOTE — Evaluation (Signed)
Physical Therapy Evaluation Patient Details Name: Ashley [name] MRN: [id number] DOB: June 03, 1931 Today's Date: 03/04/2016   History of Present Illness  Ashley [name] is a 82 y.o. female with medical history significant for hypothyroidism, hypertension, GERD, and osteoporosis who presents to the emergency department with acute pain in the left shoulder, left wrist, and left lateral chest wall after a fall at home, came to the ER where she was diagnosed with left humerus fracture, UTI or fractures and admitted to the hospital  Clinical Impression  Pt received in bed, with friend present, and pt is agreeable to PT evaluation.  Pt is normally modified independent, and ambulates with a quad cane, which she calls a "quad walker."   During evaluation today, she was unable to perform sit<>stand from EOB despite Max A due to LE's slipping fwd.  Therefore, STEDY required to assist her with this transfer.  Pt remained NWB L UE in a sling.  During mobility, pt's SpO2 dropped to 87% on RA, and Lauren, RN present to assist with titration of supplemental O2.  Pt back up on 4L at the end of the tx to maintain SpO2 >90%.  Due to need for Max A for transfer, and living alone, she is recommended for SNF.  This was discussed with the pt, who expressed that she strongly does not want to go to SNF.  If she ends up going home, she will need 24/7 supervision/assistance, HHPT, HHOT, BSC, and a w/c.      Follow Up Recommendations SNF;Other (comment) (Pt expressed that she strongly does not want to go to rehab. )    Equipment Recommendations       Recommendations for Other Services      Precautions / Restrictions Precautions Precautions: Fall;Shoulder Type of Shoulder Precautions: left proximal shoulder fracture, sling at all times. Shoulder Interventions: Shoulder sling/immobilizer Precaution Comments: adjusted sling for improved support of left arm and improved comfort of left arm Restrictions Weight Bearing  Restrictions: Yes LUE Weight Bearing: Non weight bearing      Mobility  Bed Mobility Overal bed mobility: Needs Assistance Bed Mobility: Supine to Sit     Supine to sit: Mod assist;HOB elevated (increased time with use of bed pad to assist with scooting hips to the EOB.  )     General bed mobility comments: Once sitting on EOB she demonstrates significant lean to the right - unsure if this is due to chronic postural changes  Transfers Overall transfer level: Needs assistance   Transfers: Sit to/from Stand;Stand Pivot Transfers Sit to Stand: Max assist (Attempted to transfer sit<>stand, however pt expressed inability to push up with LE's due to feet slipping fwd despite having gripper socks on.  ) Stand pivot transfers: Total assist;From elevated surface (Unable to secure gait belt around pt's trunk well due to pain from rib/humerus fx, and kyphotic posutre.  Therefore, bed pad used to assist pts hips up and fwd.  STEDY used for transfer - unable to flip seat part down due to kyphotic posture.  )          Ambulation/Gait Ambulation/Gait assistance:  (NA due to poor sit<>Stand. )              Stairs            Wheelchair Mobility    Modified Rankin (Stroke Patients Only)       Balance Overall balance assessment: Needs assistance Sitting-balance support: Single extremity supported Sitting balance-Leahy Scale: Poor Sitting balance - Comments:  significant lean to the right - unsure if this his her normal chronic posture due to kyphosis.    Standing balance support: Single extremity supported Standing balance-Leahy Scale: Poor                               Pertinent Vitals/Pain Pain Assessment: 0-10 Pain Score: 5  Pain Location: left shoulder. and L arm Pain Descriptors / Indicators: Aching Pain Intervention(s): Limited activity within patient's tolerance;Premedicated before session;Repositioned    Home Living Family/patient expects to be  discharged to:: Private residence Living Arrangements: Alone Available Help at Discharge: Friend(s) (Ms. Melina Copa comes 2x's per week, and people that feed her dogs 1x per day. ) Type of Home: House Home Access: Stairs to enter   [employer] of Steps: 2 back door with no HR.  Home Layout: Two level;Able to live on main level with bedroom/bathroom Home Equipment: Bedside commode;Cane - quad;Walker - 2 wheels - pt states that the [facility] is not able to be moved from the toilet.       Prior Function Level of Independence: Independent with assistive device(s)   Gait / Transfers Assistance Needed: Pt was using the "quad walker" for ambulation just prior to admission.   ADL's / Homemaking Assistance Needed: independent with dressing, and sponge bathing.   Comments: able to compelte all B/IADLS independently.  had a driver as she no longer drives.     Hand Dominance   Dominant Hand: Right    Extremity/Trunk Assessment   Upper Extremity Assessment: LUE deficits/detail       LUE Deficits / Details: left shoulder not assessed due to recent fracture - in sling   Lower Extremity Assessment: Generalized weakness      Cervical / Trunk Assessment: Kyphotic  Communication   Communication: No difficulties  Cognition Arousal/Alertness: Awake/alert Behavior During Therapy: WFL for tasks assessed/performed Overall Cognitive Status: Within Functional Limits for tasks assessed                      General Comments General comments (skin integrity, edema, etc.): Reddened area on L calf area, and noted bandages on R LE due to blisters per pt's friend.  Friend requested RN to change dressings today.     Exercises Other Exercises Other Exercises: recommended A/ROM of hand, wrist, forearm 10 times each 2-3 times per day.  educated on proper positioning of left shoulder sling and use of thumb loop to keep positioning in place.    Assessment/Plan    PT Assessment Patient needs  continued PT services  PT Problem List Decreased strength;Decreased activity tolerance;Decreased balance;Decreased mobility;Decreased knowledge of use of DME;Decreased safety awareness;Cardiopulmonary status limiting activity;Decreased knowledge of precautions;Decreased skin integrity;Pain          PT Treatment Interventions DME instruction;Gait training;Stair training;Functional mobility training;Therapeutic activities;Therapeutic exercise;Balance training;Patient/family education;Wheelchair mobility training    PT Goals (Current goals can be found in the Care Plan section)  Acute Rehab PT Goals Patient Stated Goal: I want to get home and be able to do for myself PT Goal Formulation: With patient/family Time For Goal Achievement: 03/11/16 Potential to Achieve Goals: Fair    Frequency Min 5X/week   Barriers to discharge Decreased caregiver support;Inaccessible home environment Pt lives alone, and has 2 steps to enter the house.      Co-evaluation               End of Session Equipment Utilized  During Treatment: Gait belt;Oxygen Activity Tolerance: Patient limited by pain;Patient limited by fatigue Patient left: in chair;with call bell/phone within reach;with family/visitor present Nurse Communication: Mobility status;Need for lift equipment;Precautions;Weight bearing status (STEDY - Lauren, RN present for transfer.   NWB L UE - in sling)    Functional Assessment Tool Used: [employer]	[place] AM-PAC "6-clicks"  Functional Limitation: Mobility: Walking and moving around Mobility: Walking and Moving Around Current Status [phone]): At least 60 percent but less than 80 percent impaired, limited or restricted Mobility: Walking and Moving Around Goal Status [phone]): At least 40 percent but less than 60 percent impaired, limited or restricted    Time: [id number] PT Time Calculation (min) (ACUTE ONLY): 43 min   Charges:   PT Evaluation $PT Eval Moderate Complexity: 1 Procedure PT  Treatments $Therapeutic Activity: 23-37 mins   PT G Codes:   PT G-Codes **NOT FOR INPATIENT CLASS** Functional Assessment Tool Used: [employer] "6-clicks"  Functional Limitation: Mobility: Walking and moving around Mobility: Walking and Moving Around Current Status [phone]): At least 60 percent but less than 80 percent impaired, limited or restricted Mobility: Walking and Moving Around Goal Status [phone]): At least 40 percent but less than 60 percent impaired, limited or restricted    Beth Itzamar Traynor, PT, DPT X: [phone]

## 2016-03-04 NOTE — Progress Notes (Signed)
SATURATION QUALIFICATIONS: (This note is used to comply with regulatory documentation for home oxygen)  Patient Saturations on Room Air at Rest = 88%  Patient Saturations on Room Air while Ambulating = 85%  Patient Saturations on 2 Liters of oxygen while Ambulating = 93%

## 2016-03-04 NOTE — Clinical Social Work Placement (Signed)
   CLINICAL SOCIAL WORK PLACEMENT  NOTE  Date:  03/04/2016  Patient Details  Name: Ashley [name] MRN: [id number] Date of Birth: 11-12-1931  Clinical Social Work is seeking post-discharge placement for this patient at the [place] level of care (*CSW will initial, date and re-position this form in  chart as items are completed):  Yes   Patient/family provided with [facility] Work Department's list of facilities offering this level of care within the geographic area requested by the patient (or if unable, by the patient's family).  Yes   Patient/family informed of their freedom to choose among providers that offer the needed level of care, that participate in Medicare, Medicaid or managed care program needed by the patient, have an available bed and are willing to accept the patient.  Yes   Patient/family informed of [facility]'s ownership interest in [facility] and [facility], as well as of the fact that they are under no obligation to receive care at these facilities.  PASRR submitted to EDS on       PASRR number received on       Existing PASRR number confirmed on 03/04/16     FL2 transmitted to all facilities in geographic area requested by pt/family on 03/04/16     FL2 transmitted to all facilities within larger geographic area on       Patient informed that his/her managed care company has contracts with or will negotiate with certain facilities, including the following:            Patient/family informed of bed offers received.  Patient chooses bed at       Physician recommends and patient chooses bed at      Patient to be transferred to   on  .  Patient to be transferred to facility by       Patient family notified on   of transfer.  Name of family member notified:        PHYSICIAN       Additional Comment:    _______________________________________________ Salome Arnt, [place] 03/04/2016, 2:45  PM [phone]

## 2016-03-04 NOTE — NC FL2 (Signed)
MEDICAID FL2 LEVEL OF CARE SCREENING TOOL     IDENTIFICATION  Patient Name: Ashley [name] Birthdate: 1931/11/27 Sex: female Admission Date (Current Location): 03/02/2016  [facility] and [place] Number:  [employer] and Address:  [place] [address] [place]      Provider Number: [id number]  Attending Physician Name and Address:  Thurnell Lose, MD  Relative Name and Phone Number:       Current Level of Care: Hospital Recommended Level of Care: [place] Prior Approval Number:    Date Approved/Denied:   PASRR Number: [id number]  Discharge Plan: SNF    Current Diagnoses: Patient Active Problem List   Diagnosis Date Noted  . Multiple rib fractures 03/03/2016  . Multiple closed fractures of ribs of left side   . Closed nondisplaced fracture of surgical neck of left humerus   . Rib fractures 03/02/2016  . Hypoxia 03/02/2016  . Humerus shaft fracture 03/02/2016  . Fall as cause of accidental injury at home as place of occurrence 03/02/2016  . Iron deficiency anemia 07/08/2014  . Other pancytopenia ([place]) 05/29/2014  . Osteoporosis 03/06/2012  . Bilateral ovarian cysts 02/20/2012  . Left-sided breast cancer 11/14/2011  . Hypothyroidism 03/10/2007  . Essential hypertension 03/10/2007  . GERD 03/10/2007    Orientation RESPIRATION BLADDER Height & Weight     Self, Time, Situation, Place  O2 (4 L) Continent Weight: 110 lb 14.3 oz (50.3 kg) Height:  4\' 8"  (142.2 cm)  BEHAVIORAL SYMPTOMS/MOOD NEUROLOGICAL BOWEL NUTRITION STATUS  Other (Comment) (none)  (n/a) Continent Diet (Regular)  AMBULATORY STATUS COMMUNICATION OF NEEDS Skin   Total Care Verbally Bruising, Other (Comment) (Bilateral legs blister)                       Personal Care Assistance Level of Assistance  Bathing, Feeding, Dressing Bathing Assistance: Maximum assistance Feeding assistance: Limited assistance Dressing Assistance:  Maximum assistance     Functional Limitations Info  Sight, Hearing, Speech Sight Info: Adequate Hearing Info: Adequate Speech Info: Adequate    SPECIAL CARE FACTORS FREQUENCY  PT (By licensed PT)     PT Frequency: 5              Contractures      Additional Factors Info  Psychotropic Code Status Info: Full code Allergies Info: Ciprofloxacin, Codeine Phosphate, Doxycycline, Erythromycin Ethylsuccinate, Levofloxacin, Penicillins, Sulfamethoxazole, Tetracycline Hcl, Lidocaine Psychotropic Info: Xanax         Current Medications (03/04/2016):  This is the current hospital active medication list Current Facility-Administered Medications  Medication Dose Route Frequency Provider Last Rate Last Dose  . acetaminophen (TYLENOL) tablet 650 mg  650 mg Oral Q6H PRN Vianne Bulls, MD       Or  . acetaminophen (TYLENOL) suppository 650 mg  650 mg Rectal Q6H PRN Vianne Bulls, MD      . ALPRAZolam Duanne Moron) tablet 0.25-0.5 mg  0.25-0.5 mg Oral TID PRN Vianne Bulls, MD   0.25 mg at 03/03/16 0956  . aspirin EC tablet 81 mg  81 mg Oral Q lunch Vianne Bulls, MD   81 mg at 03/04/16 1234  . bisacodyl (DULCOLAX) EC tablet 5 mg  5 mg Oral Daily PRN Ilene Qua Opyd, MD      . cefTRIAXone (ROCEPHIN) 1 g in dextrose 5 % 50 mL IVPB  1 g Intravenous Q24H Thurnell Lose, MD   1 g at  03/04/16 1000  . HYDROcodone-acetaminophen (NORCO) 10-325 MG per tablet 1 tablet  1 tablet Oral Q6H PRN Vianne Bulls, MD   1 tablet at 03/04/16 1234  . levothyroxine (SYNTHROID, LEVOTHROID) tablet 75 mcg  75 mcg Oral QAC breakfast Vianne Bulls, MD   75 mcg at 03/04/16 0853  . [START ON 03/05/2016] metoprolol succinate (TOPROL-XL) 24 hr tablet 25 mg  25 mg Oral Daily Thurnell Lose, MD      . ondansetron (ZOFRAN) tablet 4 mg  4 mg Oral Q6H PRN Vianne Bulls, MD       Or  . ondansetron (ZOFRAN) injection 4 mg  4 mg Intravenous Q6H PRN Vianne Bulls, MD   4 mg at 03/03/16 [id number]  . pantoprazole (PROTONIX) EC  tablet 40 mg  40 mg Oral Daily Vianne Bulls, MD   40 mg at 03/04/16 0854  . polyethylene glycol (MIRALAX / GLYCOLAX) packet 17 g  17 g Oral Daily PRN Vianne Bulls, MD         Discharge Medications: Please see discharge summary for a list of discharge medications.  Relevant Imaging Results:  Relevant Lab Results:   Additional Information SSN: [id number]  Salome Arnt, [place]

## 2016-03-04 NOTE — Progress Notes (Signed)
PROGRESS NOTE                                                                                                                                                                                                             Patient Demographics:    Ashley [name], is a 82 y.o. female, DOB - 09/27/1931, [id number]  Admit date - 03/02/2016   Admitting Physician Vianne Bulls, MD  Outpatient Primary MD for the patient is [name]., MD  LOS - 2  Chief Complaint  Patient presents with  . Fall  . Shoulder Injury       Brief Narrative  Ashley [name] is a 82 y.o. female with medical history significant for hypothyroidism, hypertension, GERD, and osteoporosis who presents to the emergency department with acute pain in the left shoulder, left wrist, and left lateral chest wall after a fall at home, came to the ER where she was diagnosed with left humerus fracture, UTI or fractures and admitted to the hospital.   Subjective:    Ashley [name] today has, No headache, No chest pain, No abdominal pain - No Nausea, No new weakness tingling or numbness, No Cough - SOB.     Assessment  & Plan :     1. Mechanical Fall with fracture of left humerus and multiple left ribs - ED physician had discussed with orthopedic surgeon on call, left arm placed in sling per orthopedic surgeon with outpatient follow-up, continue incentive spirometer 3-4 times every hour while awake for rib fractures, patient counseled by myself. PTOT evaluation, may require placement. Seen by our to Dr. Aline Brochure on 03/04/2016 no further recommendations.  2. UTI. Rocephin 3 days, follow cultures.  3. Medical mild atelectasis. Due to rib fractures, continue IS.Try to titrate off oxygen.  4. Hypothyroidism. Continue Synthroid.  5. Normocytic anemia and mild thrombocytopenia. Anemia panel inconclusive, No signs of bleeding, continue supportive care follow with  PCP for outpatient age-appropriate workup.  6.HTN - blood pressure soft beta blocker dose cut in half.   Family Communication  :  None  Code Status :  Full  Diet : Heart Healthy  Disposition Plan  :  TBD  Consults  :  Orthopedic surgery  Procedures  :     DVT Prophylaxis  :   SCDs    Lab Results  Component Value  Date   PLT 97 (L) 03/02/2016    Inpatient Medications  Scheduled Meds: . aspirin EC  81 mg Oral Q lunch  . cefTRIAXone (ROCEPHIN)  IV  1 g Intravenous Q24H  . levothyroxine  75 mcg Oral QAC breakfast  . metoprolol succinate  50 mg Oral Daily  . pantoprazole  40 mg Oral Daily   Continuous Infusions:  PRN Meds:.acetaminophen **OR** acetaminophen, ALPRAZolam, bisacodyl, HYDROcodone-acetaminophen, HYDROmorphone (DILAUDID) injection, ondansetron **OR** ondansetron (ZOFRAN) IV, polyethylene glycol  Antibiotics  :    Anti-infectives    Start     Dose/Rate Route Frequency Ordered Stop   03/03/16 1000  cefTRIAXone (ROCEPHIN) 1 g in dextrose 5 % 50 mL IVPB     1 g 100 mL/hr over 30 Minutes Intravenous Every 24 hours 03/03/16 0746 03/06/16 0959         Objective:   Vitals:   03/03/16 0957 03/03/16 2011 03/03/16 2206 03/04/16 0628  BP: (!) 111/56  (!) 98/44 (!) 92/43  Pulse: 87  74 66  Resp: 17  17 17   Temp:   97.8 F (36.6 C) 98.4 F (36.9 C)  TempSrc:   Oral Oral  SpO2: 100% 95% 95% 100%  Weight:    50.3 kg (110 lb 14.3 oz)  Height:        Wt Readings from Last 3 Encounters:  03/04/16 50.3 kg (110 lb 14.3 oz)  01/17/16 49.4 kg (108 lb 14.4 oz)  07/26/15 49 kg (108 lb)     Intake/Output Summary (Last 24 hours) at 03/04/16 0851 Last data filed at 03/03/16 1320  Gross per 24 hour  Intake             1215 ml  Output                0 ml  Net             1215 ml     Physical Exam  Awake Alert, Oriented X 3, No new F.N deficits, Normal affect .AT,PERRAL Supple Neck,No JVD, No cervical lymphadenopathy appriciated.  Symmetrical Chest wall  movement, Good air movement bilaterally, CTAB RRR,No Gallops,Rubs or new Murmurs, No Parasternal Heave +ve B.Sounds, Abd Soft, No tenderness, No organomegaly appriciated, No rebound - guarding or rigidity. No Cyanosis, Clubbing or edema, No new Rash or bruise , L arm in sling     Data Review:    CBC  Recent Labs Lab 03/02/16 2227  WBC 14.0*  HGB 11.6*  HCT 37.7  PLT 97*  MCV 95.7  MCH 29.4  MCHC 30.8  RDW 13.8  LYMPHSABS 0.9  MONOABS 0.8  EOSABS 0.0  BASOSABS 0.0    Chemistries   Recent Labs Lab 03/02/16 2227 03/03/16 0627  NA 138 137  K 5.0 4.7  CL 103 104  CO2 28 29  GLUCOSE 169* 161*  BUN 21* 24*  CREATININE 0.75 0.74  CALCIUM 9.3 8.6*   ------------------------------------------------------------------------------------------------------------------ No results for input(s): CHOL, HDL, LDLCALC, TRIG, CHOLHDL, LDLDIRECT in the last 72 hours.  No results found for: HGBA1C ------------------------------------------------------------------------------------------------------------------ No results for input(s): TSH, T4TOTAL, T3FREE, THYROIDAB in the last 72 hours.  Invalid input(s): FREET3 ------------------------------------------------------------------------------------------------------------------  Recent Labs  03/03/16 0627  VITAMINB12 159*  FERRITIN 110  TIBC 262  IRON 19*    Coagulation profile  Recent Labs Lab 03/02/16 2227  INR 1.22    No results for input(s): DDIMER in the last 72 hours.  Cardiac Enzymes No results for input(s): CKMB, TROPONINI, MYOGLOBIN in the  last 168 hours.  Invalid input(s): CK ------------------------------------------------------------------------------------------------------------------ No results found for: BNP  Micro Results No results found for this or any previous visit (from the past 240 hour(s)).  Radiology Reports Dg Ribs Unilateral W/chest Left  Result Date: 03/02/2016 CLINICAL DATA:   Status post fall, left rib the cage pain. EXAM: LEFT RIBS AND CHEST - 3+ VIEW COMPARISON:  None. FINDINGS: There several mildly displaced left-sided rib fractures, probably 7, 8, 9 and tenth ribs. There is no evidence of pneumothorax. Elevation of the left hemidiaphragm and probable large hiatal hernia are are noted. IMPRESSION: Left lateral rib fractures. No evidence of pneumothorax. Electronically Signed   By: Fidela Salisbury M.D.   On: 03/02/2016 22:28   Dg Elbow Complete Left  Result Date: 03/02/2016 CLINICAL DATA:  Status post fall, with left elbow pain. Initial encounter. EXAM: LEFT ELBOW - COMPLETE 3+ VIEW COMPARISON:  None. FINDINGS: There is no evidence of fracture or dislocation. The visualized joint spaces are preserved. No significant joint effusion is identified. The soft tissues are unremarkable in appearance. IMPRESSION: No evidence of fracture or dislocation. Electronically Signed   By: Garald Balding M.D.   On: 03/02/2016 22:25   Dg Wrist 2 Views Left  Result Date: 03/02/2016 CLINICAL DATA:  [place] today with left shoulder and left wrist pain. EXAM: LEFT WRIST - 2 VIEW COMPARISON:  None. FINDINGS: Plate and screw fixation of an old fracture deformity of the distal left radius. Old ununited ossicle over the left ulnar styloid process. Hardware components appear well seated. No acute fracture or dislocation is identified. Degenerative changes in the intercarpal joints. Mild dorsal soft tissue swelling. IMPRESSION: Old fracture deformities of the distal left radius and ulna with plate and screw fixation of the radius. Degenerative changes in the wrist. No acute bony abnormalities. Electronically Signed   By: Lucienne Capers M.D.   On: 03/02/2016 19:51   Dg Shoulder Left  Result Date: 03/02/2016 CLINICAL DATA:  [place] today with left shoulder and left wrist pain. EXAM: LEFT SHOULDER - 2+ VIEW COMPARISON:  None. FINDINGS: Transverse fractures across the left humeral neck with impaction of  fracture fragments. Fracture lines may extend to the greater tuberosity. No dislocation at the glenohumeral joint. Fracture of the left anterior rib. Possible linear infiltration or atelectasis in the left lung. IMPRESSION: Transverse impacted fracture of the left humeral neck with possible extension to the greater tuberosity. Electronically Signed   By: Lucienne Capers M.D.   On: 03/02/2016 19:50    Time Spent in minutes  30   Jahayra Mazo K M.D on 03/04/2016 at 8:51 AM  Between 7am to 7pm - Pager - [phone]  After 7pm go to [email or web address] - password [email or web address]  [facility] -  Office  [phone]

## 2016-03-04 NOTE — Evaluation (Signed)
Occupational Therapy Evaluation Patient Details Name: Ashley [name] MRN: [id number] DOB: Nov 13, 1931 Today's Date: 03/04/2016    History of Present Illness Ashley [name] is a 82 y.o. female with medical history significant for hypothyroidism, hypertension, GERD, and osteoporosis who presents to the emergency department with acute pain in the left shoulder, left wrist, and left lateral chest wall after a fall at home, came to the ER where she was diagnosed with left humerus fracture, UTI or fractures and admitted to the hospital   Clinical Impression   Pt admitted with left proximal humerus fracture and left rib fracture . Pt currently with functional limitations due to the deficits listed below (see OT Problem List).  Pt will benefit from skilled OT to increase their safety and independence with ADL and functional mobility for ADL to facilitate discharge to venue listed below.      Follow Up Recommendations  Home health OT    Equipment Recommendations       Recommendations for Other Services       Precautions / Restrictions Precautions Precautions: Shoulder;Fall Type of Shoulder Precautions: left proximal shoulder fracture, sling at all times. Shoulder Interventions: Shoulder sling/immobilizer Precaution Comments: adjusted sling for improved support of left arm and improved comfort of left arm Restrictions Weight Bearing Restrictions: Yes LUE Weight Bearing: Non weight bearing (left upper extremity)      Mobility Bed Mobility                  Transfers                      Balance                                            ADL                                         General ADL Comments: patient able to complete feeding and grooming with right hand after setup.  patient will require mod to max assist for dressing, bathing, toileting.  educated patient on proper upper extremity dressing techniques this date for  donning left arm first6.     Vision     Perception     Praxis      Pertinent Vitals/Pain Pain Assessment: 0-10 Pain Score: 7  Pain Location: left shoulder  Pain Descriptors / Indicators: Aching;Constant Pain Intervention(s): Repositioned;Other (comment) (adjusted sling)     Hand Dominance Right   Extremity/Trunk Assessment Upper Extremity Assessment Upper Extremity Assessment: LUE deficits/detail LUE Deficits / Details: left shoulder  not assessed due to recent fracture, left elbow P/ROM is [facility]  wrist and hand A/ROM is [facility]   Lower Extremity Assessment Lower Extremity Assessment: Defer to PT evaluation       Communication Communication Communication: No difficulties   Cognition Arousal/Alertness: Awake/alert Behavior During Therapy: WFL for tasks assessed/performed Overall Cognitive Status: Within Functional Limits for tasks assessed                     General Comments       Exercises   Other Exercises Other Exercises: recommended A/ROM of hand, wrist, forearm 10 times each 2-3 times per day.  educated on proper positioning of left shoulder sling and use  of thumb loop to keep positioning in place.    Shoulder Instructions      Home Living Family/patient expects to be discharged to:: Private residence Living Arrangements: Alone Available Help at Discharge: Friend(s) (Ms. Melina Copa comes 2x's per week, and people that feed her dogs 1x per day. ) Type of Home: House Home Access: Stairs to enter [employer] of Steps: 2 back door with no HR.    Home Layout: Two level;Able to live on main level with bedroom/bathroom Alternate Level Stairs-Number of Steps: Pt no longer goes upstairs.    Bathroom Shower/Tub: Tub/shower unit (sponge baths)     Bathroom Accessibility: Yes   Home Equipment: Bedside commode;Cane - quad;Walker - 2 wheels          Prior Functioning/Environment Level of Independence: Independent with assistive device(s)  Gait /  Transfers Assistance Needed: Pt was using the "quad walker" for ambulation just prior to admission.  ADL's / Homemaking Assistance Needed: independent with dressing, and sponge bathing.    Comments: able to compelte all B/IADLS independently.  had a driver as she no longer drives.        OT Problem List: Decreased strength;Decreased range of motion;Decreased knowledge of precautions;Impaired UE functional use;Pain   OT Treatment/Interventions: Self-care/ADL training;Therapeutic exercise;Neuromuscular education;DME and/or AE instruction;Therapeutic activities;Modalities;Manual therapy;Patient/family education    OT Goals(Current goals can be found in the care plan section) Acute Rehab OT Goals Patient Stated Goal: I want to get home and be able to do for myself OT Goal Formulation: With patient Time For Goal Achievement: 03/08/16 Potential to Achieve Goals: Good  OT Frequency: Min 2X/week OT goals:  Patient will be educated on UE dressing techniques for left proximal humerus fracture.   Patient will be min pa with upper extermity dressing and bathing. Patient will be mod pa with lower body dressing and bathing.   Barriers to D/C:    patient reports that she has assistance available at home        Co-evaluation              End of Session    Activity Tolerance: Patient tolerated treatment well Patient left: in bed;with call bell/phone within reach   Time: 0915-0945 OT Time Calculation (min): 30 min Charges:  OT General Charges $OT Visit: 1 Procedure OT Evaluation $OT Eval Low Complexity: 1 Procedure G-Codes:    Vangie Bicker, [place], OTR/L [phone]  03/04/2016, 11:00 AM

## 2016-03-05 DIAGNOSIS — Z853 Personal history of malignant neoplasm of breast: Secondary | ICD-10-CM | POA: Diagnosis not present

## 2016-03-05 DIAGNOSIS — D649 Anemia, unspecified: Secondary | ICD-10-CM | POA: Diagnosis not present

## 2016-03-05 DIAGNOSIS — S40022A Contusion of left upper arm, initial encounter: Secondary | ICD-10-CM | POA: Diagnosis not present

## 2016-03-05 DIAGNOSIS — S4992XA Unspecified injury of left shoulder and upper arm, initial encounter: Secondary | ICD-10-CM | POA: Diagnosis present

## 2016-03-05 DIAGNOSIS — R279 Unspecified lack of coordination: Secondary | ICD-10-CM | POA: Diagnosis not present

## 2016-03-05 DIAGNOSIS — S42215D Unspecified nondisplaced fracture of surgical neck of left humerus, subsequent encounter for fracture with routine healing: Secondary | ICD-10-CM | POA: Diagnosis not present

## 2016-03-05 DIAGNOSIS — Z743 Need for continuous supervision: Secondary | ICD-10-CM | POA: Diagnosis not present

## 2016-03-05 DIAGNOSIS — E039 Hypothyroidism, unspecified: Secondary | ICD-10-CM | POA: Diagnosis not present

## 2016-03-05 DIAGNOSIS — M80021D Age-related osteoporosis with current pathological fracture, right humerus, subsequent encounter for fracture with routine healing: Secondary | ICD-10-CM | POA: Diagnosis not present

## 2016-03-05 DIAGNOSIS — S2249XA Multiple fractures of ribs, unspecified side, initial encounter for closed fracture: Secondary | ICD-10-CM | POA: Diagnosis not present

## 2016-03-05 DIAGNOSIS — R278 Other lack of coordination: Secondary | ICD-10-CM | POA: Diagnosis not present

## 2016-03-05 DIAGNOSIS — M25512 Pain in left shoulder: Secondary | ICD-10-CM | POA: Diagnosis not present

## 2016-03-05 DIAGNOSIS — E0590 Thyrotoxicosis, unspecified without thyrotoxic crisis or storm: Secondary | ICD-10-CM | POA: Diagnosis not present

## 2016-03-05 DIAGNOSIS — S299XXA Unspecified injury of thorax, initial encounter: Secondary | ICD-10-CM | POA: Diagnosis not present

## 2016-03-05 DIAGNOSIS — Y939 Activity, unspecified: Secondary | ICD-10-CM | POA: Diagnosis not present

## 2016-03-05 DIAGNOSIS — S2242XD Multiple fractures of ribs, left side, subsequent encounter for fracture with routine healing: Secondary | ICD-10-CM | POA: Diagnosis not present

## 2016-03-05 DIAGNOSIS — R0902 Hypoxemia: Secondary | ICD-10-CM | POA: Diagnosis not present

## 2016-03-05 DIAGNOSIS — S301XXA Contusion of abdominal wall, initial encounter: Secondary | ICD-10-CM | POA: Diagnosis not present

## 2016-03-05 DIAGNOSIS — R2681 Unsteadiness on feet: Secondary | ICD-10-CM | POA: Diagnosis not present

## 2016-03-05 DIAGNOSIS — Z79899 Other long term (current) drug therapy: Secondary | ICD-10-CM | POA: Diagnosis not present

## 2016-03-05 DIAGNOSIS — D62 Acute posthemorrhagic anemia: Secondary | ICD-10-CM | POA: Diagnosis not present

## 2016-03-05 DIAGNOSIS — D509 Iron deficiency anemia, unspecified: Secondary | ICD-10-CM | POA: Diagnosis not present

## 2016-03-05 DIAGNOSIS — S2242XA Multiple fractures of ribs, left side, initial encounter for closed fracture: Secondary | ICD-10-CM | POA: Diagnosis not present

## 2016-03-05 DIAGNOSIS — I10 Essential (primary) hypertension: Secondary | ICD-10-CM | POA: Diagnosis not present

## 2016-03-05 DIAGNOSIS — Y929 Unspecified place or not applicable: Secondary | ICD-10-CM | POA: Diagnosis not present

## 2016-03-05 DIAGNOSIS — Z7982 Long term (current) use of aspirin: Secondary | ICD-10-CM | POA: Diagnosis not present

## 2016-03-05 DIAGNOSIS — D696 Thrombocytopenia, unspecified: Secondary | ICD-10-CM | POA: Diagnosis not present

## 2016-03-05 DIAGNOSIS — N390 Urinary tract infection, site not specified: Secondary | ICD-10-CM | POA: Diagnosis not present

## 2016-03-05 DIAGNOSIS — S42213A Unspecified displaced fracture of surgical neck of unspecified humerus, initial encounter for closed fracture: Secondary | ICD-10-CM | POA: Diagnosis not present

## 2016-03-05 DIAGNOSIS — K219 Gastro-esophageal reflux disease without esophagitis: Secondary | ICD-10-CM | POA: Diagnosis not present

## 2016-03-05 DIAGNOSIS — S4292XD Fracture of left shoulder girdle, part unspecified, subsequent encounter for fracture with routine healing: Secondary | ICD-10-CM | POA: Diagnosis not present

## 2016-03-05 DIAGNOSIS — R69 Illness, unspecified: Secondary | ICD-10-CM | POA: Diagnosis not present

## 2016-03-05 DIAGNOSIS — D500 Iron deficiency anemia secondary to blood loss (chronic): Secondary | ICD-10-CM | POA: Diagnosis not present

## 2016-03-05 DIAGNOSIS — M6281 Muscle weakness (generalized): Secondary | ICD-10-CM | POA: Diagnosis not present

## 2016-03-05 DIAGNOSIS — S7002XA Contusion of left hip, initial encounter: Secondary | ICD-10-CM | POA: Diagnosis not present

## 2016-03-05 DIAGNOSIS — Y999 Unspecified external cause status: Secondary | ICD-10-CM | POA: Diagnosis not present

## 2016-03-05 DIAGNOSIS — W19XXXA Unspecified fall, initial encounter: Secondary | ICD-10-CM | POA: Diagnosis not present

## 2016-03-05 DIAGNOSIS — Z7401 Bed confinement status: Secondary | ICD-10-CM | POA: Diagnosis not present

## 2016-03-05 DIAGNOSIS — J449 Chronic obstructive pulmonary disease, unspecified: Secondary | ICD-10-CM | POA: Diagnosis not present

## 2016-03-05 LAB — GLUCOSE, CAPILLARY: Glucose-Capillary: 101 mg/dL — ABNORMAL HIGH (ref 65–99)

## 2016-03-05 MED ORDER — METOPROLOL SUCCINATE ER 25 MG PO TB24: 25.0000 mg | ORAL_TABLET | Freq: Every day | ORAL | Status: DC

## 2016-03-05 MED ORDER — BISACODYL 5 MG PO TBEC: 5.0000 mg | DELAYED_RELEASE_TABLET | Freq: Every day | ORAL | 0 refills | Status: DC | PRN

## 2016-03-05 MED ORDER — HYDROCODONE-ACETAMINOPHEN 10-325 MG PO TABS: 1.0000 | ORAL_TABLET | Freq: Four times a day (QID) | ORAL | 0 refills | Status: DC | PRN

## 2016-03-05 MED ORDER — ALPRAZOLAM 0.5 MG PO TABS: 0.2500 mg | ORAL_TABLET | Freq: Three times a day (TID) | ORAL | 0 refills | Status: DC | PRN

## 2016-03-05 NOTE — Progress Notes (Signed)
Called report to [facility] nurse. Removed IV and am awaiting EMS to transport the patient.

## 2016-03-05 NOTE — Care Management Important Message (Signed)
Important Message  Patient Details  Name: STANA WAKEFORD MRN: [id number] Date of Birth: 1931-07-28   Medicare Important Message Given:  Yes    Sherald Barge, RN 03/05/2016, 8:55 AM

## 2016-03-05 NOTE — Discharge Summary (Signed)
Ashley [name] [id number] DOB: 01-15-1932 DOA: 03/02/2016  PCP: [name]., MD  Admit date: 03/02/2016  Discharge date: 03/05/2016  Admitted From: Home  Disposition:  SNF   Recommendations for Outpatient Follow-up:   Follow up with PCP in 1-2 weeks  PCP Please obtain BMP/CBC, 2 view CXR in 1week,  (see Discharge instructions)   PCP Please follow up on the following pending results: None   Home Health: None   Equipment/Devices: None  Consultations: Ortho Dr Aline Brochure Discharge Condition: Stable   CODE STATUS: Full   Diet Recommendation: Heart Healthy    Chief Complaint  Patient presents with  . Fall  . Shoulder Injury     Brief history of present illness from the day of admission and additional interim summary    Ashley Mohamud Sheltonis a 82 y.o.femalewith medical history significant for hypothyroidism, hypertension, GERD, and osteoporosis who presents to the emergency department with acute pain in the left shoulder, left wrist, and left lateral chest wall after a fall at home, came to the ER where she was diagnosed with left humerus fracture, UTI or fractures and admitted to the hospital.  Hospital issues addressed    1. Mechanical Fall with fracture of left humerus and multiple left ribs - ED physician had discussed with orthopedic surgeon on call and thenSeen by Dr. Aline Brochure briefly on 03/04/2016 in the room, left arm placed in sling per orthopedic surgeon with outpatient follow-up, continue incentive spirometer 3-4 times every hour while awake for rib fractures, patient counseled by myself. Seen by PTOT, the left arm to remain in [place] seen by orthopedics again in a week, SNF placement.  2. UTI. Treated with Rocephin 3 days.  3. Medical mild atelectasis. Due to rib fractures, continue  IS.Try to titrate off oxygen.  4. Hypothyroidism. Continue Synthroid.  5. Normocytic anemia and mild thrombocytopenia. Anemia panel inconclusive, No signs of bleeding, continue supportive care follow with PCP for outpatient age-appropriate workup.  6.HTN - blood pressure soft beta blocker dose cut in half.   Discharge diagnosis     Principal Problem:   Rib fractures Active Problems:   Hypothyroidism   Essential hypertension   GERD   Iron deficiency anemia   Hypoxia   Humerus shaft fracture   Fall as cause of accidental injury at home as place of occurrence   Multiple rib fractures   Multiple closed fractures of ribs of left side   Closed nondisplaced fracture of surgical neck of left humerus    Discharge instructions    Discharge Instructions    Diet - low sodium heart healthy    Complete by:  As directed    Discharge instructions    Complete by:  As directed    Follow with Primary MD [name]., MD in 7 days , keep L arm in the sling  Get CBC, CMP, 2 view Chest X ray checked  by Primary MD or SNF MD in 5-7 days ( we routinely change or add medications that can affect your baseline labs and  fluid status, therefore we recommend that you get the mentioned basic workup next visit with your PCP, your PCP may decide not to get them or add new tests based on their clinical decision)   Activity: As tolerated with Full fall precautions use walker/cane & assistance as needed   Disposition SNF   Diet:   Heart Healthy    For Heart failure patients - Check your Weight same time everyday, if you gain over 2 pounds, or you develop in leg swelling, experience more shortness of breath or chest pain, call your Primary MD immediately. Follow Cardiac Low Salt Diet and 1.5 lit/day fluid restriction.   On your next visit with your primary care physician please Get Medicines reviewed and adjusted.   Please request your Prim.MD to go over all Hospital Tests and  Procedure/Radiological results at the follow up, please get all Hospital records sent to your Prim MD by signing hospital release before you go home.   If you experience worsening of your admission symptoms, develop shortness of breath, life threatening emergency, suicidal or homicidal thoughts you must seek medical attention immediately by calling 911 or calling your MD immediately  if symptoms less severe.  You Must read complete instructions/literature along with all the possible adverse reactions/side effects for all the Medicines you take and that have been prescribed to you. Take any new Medicines after you have completely understood and accpet all the possible adverse reactions/side effects.   Do not drive, operate heavy machinery, perform activities at heights, swimming or participation in water activities or provide baby sitting services if your were admitted for syncope or siezures until you have seen by Primary MD or a Neurologist and advised to do so again.  Do not drive when taking Pain medications.    Do not take more than prescribed Pain, Sleep and Anxiety Medications  Special Instructions: If you have smoked or chewed Tobacco  in the last 2 yrs please stop smoking, stop any regular Alcohol  and or any Recreational drug use.  Wear Seat belts while driving.   Please note  You were cared for by a hospitalist during your hospital stay. If you have any questions about your discharge medications or the care you received while you were in the hospital after you are discharged, you can call the unit and asked to speak with the hospitalist on call if the hospitalist that took care of you is not available. Once you are discharged, your primary care physician will handle any further medical issues. Please note that NO REFILLS for any discharge medications will be authorized once you are discharged, as it is imperative that you return to your primary care physician (or establish a  relationship with a primary care physician if you do not have one) for your aftercare needs so that they can reassess your need for medications and monitor your lab values.   Increase activity slowly    Complete by:  As directed       Discharge Medications     Medication List    TAKE these medications   ALPRAZolam 0.5 MG tablet Commonly known as:  XANAX Take 0.5-1 tablets (0.25-0.5 mg total) by mouth 3 (three) times daily as needed for anxiety.   aspirin EC 81 MG tablet Take 81 mg by mouth daily with lunch.   bisacodyl 5 MG EC tablet Commonly known as:  DULCOLAX Take 1 tablet (5 mg total) by mouth daily as needed for moderate constipation.   HYDROcodone-acetaminophen 10-325 MG  tablet Commonly known as:  NORCO Take 1 tablet by mouth every 6 (six) hours as needed for moderate pain.   levothyroxine 75 MCG tablet Commonly known as:  SYNTHROID, LEVOTHROID Take 75 mcg by mouth daily before breakfast.   metoprolol succinate 25 MG 24 hr tablet Commonly known as:  TOPROL-XL Take 1 tablet (25 mg total) by mouth daily. What changed:  medication strength  how much to take  additional instructions   nitroGLYCERIN 0.4 MG SL tablet Commonly known as:  NITROSTAT Place 0.4 mg under the tongue every 5 (five) minutes as needed for chest pain.   omeprazole 20 MG capsule Commonly known as:  PRILOSEC Take 20 mg by mouth 2 (two) times daily as needed (for acid reflux).   potassium chloride SA 20 MEQ tablet Commonly known as:  K-DUR,KLOR-CON Take 20 mEq by mouth daily.   SSD 1 % cream Generic drug:  silver sulfADIAZINE Apply 1 application topically 2 (two) times daily. Pt applies to legs.   triamcinolone cream 0.1 % Commonly known as:  KENALOG Apply 1 application topically 2 (two) times daily. Pt applies to legs.       Follow-up Information    [name]., MD.   Specialty:  Internal Medicine Contact information: [address] [place]  [address] [phone]        [name]., MD. Schedule an appointment as soon as possible for a visit in 1 week(s).   Specialty:  Internal Medicine Contact information: [address] [place] [id number] [phone]        Arther Abbott, MD. Schedule an appointment as soon as possible for a visit in 1 week(s).   Specialties:  Orthopedic Surgery, Radiology Contact information: [address] [place] [address] [phone]           Major procedures and Radiology Reports - PLEASE review detailed and final reports thoroughly  -         Dg Ribs Unilateral W/chest Left  Result Date: 03/02/2016 CLINICAL DATA:  Status post fall, left rib the cage pain. EXAM: LEFT RIBS AND CHEST - 3+ VIEW COMPARISON:  None. FINDINGS: There several mildly displaced left-sided rib fractures, probably 7, 8, 9 and tenth ribs. There is no evidence of pneumothorax. Elevation of the left hemidiaphragm and probable large hiatal hernia are are noted. IMPRESSION: Left lateral rib fractures. No evidence of pneumothorax. Electronically Signed   By: Fidela Salisbury M.D.   On: 03/02/2016 22:28   Dg Elbow Complete Left  Result Date: 03/02/2016 CLINICAL DATA:  Status post fall, with left elbow pain. Initial encounter. EXAM: LEFT ELBOW - COMPLETE 3+ VIEW COMPARISON:  None. FINDINGS: There is no evidence of fracture or dislocation. The visualized joint spaces are preserved. No significant joint effusion is identified. The soft tissues are unremarkable in appearance. IMPRESSION: No evidence of fracture or dislocation. Electronically Signed   By: Garald Balding M.D.   On: 03/02/2016 22:25   Dg Wrist 2 Views Left  Result Date: 03/02/2016 CLINICAL DATA:  [place] today with left shoulder and left wrist pain. EXAM: LEFT WRIST - 2 VIEW COMPARISON:  None. FINDINGS: Plate and screw fixation of an old fracture deformity of the distal left radius. Old ununited ossicle over the left ulnar styloid  process. Hardware components appear well seated. No acute fracture or dislocation is identified. Degenerative changes in the intercarpal joints. Mild dorsal soft tissue swelling. IMPRESSION: Old fracture deformities of the distal left radius and ulna with plate and screw fixation of  the radius. Degenerative changes in the wrist. No acute bony abnormalities. Electronically Signed   By: Lucienne Capers M.D.   On: 03/02/2016 19:51   Dg Shoulder Left  Result Date: 03/02/2016 CLINICAL DATA:  [place] today with left shoulder and left wrist pain. EXAM: LEFT SHOULDER - 2+ VIEW COMPARISON:  None. FINDINGS: Transverse fractures across the left humeral neck with impaction of fracture fragments. Fracture lines may extend to the greater tuberosity. No dislocation at the glenohumeral joint. Fracture of the left anterior rib. Possible linear infiltration or atelectasis in the left lung. IMPRESSION: Transverse impacted fracture of the left humeral neck with possible extension to the greater tuberosity. Electronically Signed   By: Lucienne Capers M.D.   On: 03/02/2016 19:50    Micro Results     No results found for this or any previous visit (from the past 240 hour(s)).  Today   Subjective    Ashley [name] today has no headache,no chest abdominal pain,no new weakness tingling or numbness, feels much better wants to go home today.    Objective   Blood pressure (!) 105/50, pulse 78, temperature 97.4 F (36.3 C), temperature source Oral, resp. rate 20, height 4\' 8"  (1.422 m), weight 49.7 kg (109 lb 9.1 oz), SpO2 100 %.   Intake/Output Summary (Last 24 hours) at 03/05/16 0900 Last data filed at 03/04/16 1800  Gross per 24 hour  Intake              240 ml  Output                0 ml  Net              240 ml    Exam Awake Alert, Oriented x 3, No new F.N deficits, Normal affect [place].AT,PERRAL Supple Neck,No JVD, No cervical lymphadenopathy appriciated.  Symmetrical Chest wall movement, Good air movement  bilaterally, CTAB RRR,No Gallops,Rubs or new Murmurs, No Parasternal Heave +ve B.Sounds, Abd Soft, Non tender, No organomegaly appriciated, No rebound -guarding or rigidity. No Cyanosis, Clubbing or edema, No new Rash or bruise   Data Review   CBC w Diff:  Lab Results  Component Value Date   WBC 14.0 (H) 03/02/2016   HGB 11.6 (L) 03/02/2016   HCT 31.0 (L) 03/03/2016   PLT 97 (L) 03/02/2016   LYMPHOPCT 6 03/02/2016   BANDSPCT  07/06/2014    PATIENT IDENTIFICATION ERROR. PLEASE DISREGARD RESULTS. ACCOUNT WILL BE CREDITED.   MONOPCT 6 03/02/2016   EOSPCT 0 03/02/2016   BASOPCT 0 03/02/2016    CMP:  Lab Results  Component Value Date   NA 137 03/03/2016   K 4.7 03/03/2016   CL 104 03/03/2016   CO2 29 03/03/2016   BUN 24 (H) 03/03/2016   CREATININE 0.74 03/03/2016   PROT 6.6 01/10/2016   ALBUMIN 3.9 01/10/2016   BILITOT 1.0 01/10/2016   ALKPHOS 57 01/10/2016   AST 18 01/10/2016   ALT 10 (L) 01/10/2016  .   Total Time in preparing paper work, data evaluation and todays exam - 35 minutes  Thurnell Lose M.D on 03/05/2016 at 9:00 AM  [facility]   Office  [phone]

## 2016-03-05 NOTE — Discharge Instructions (Signed)
Follow with Primary MD [name]., MD in 7 days , keep L arm in the sling  Get CBC, CMP, 2 view Chest X ray checked  by Primary MD or SNF MD in 5-7 days ( we routinely change or add medications that can affect your baseline labs and fluid status, therefore we recommend that you get the mentioned basic workup next visit with your PCP, your PCP may decide not to get them or add new tests based on their clinical decision)   Activity: As tolerated with Full fall precautions use walker/cane & assistance as needed   Disposition SNF   Diet:   Heart Healthy    For Heart failure patients - Check your Weight same time everyday, if you gain over 2 pounds, or you develop in leg swelling, experience more shortness of breath or chest pain, call your Primary MD immediately. Follow Cardiac Low Salt Diet and 1.5 lit/day fluid restriction.   On your next visit with your primary care physician please Get Medicines reviewed and adjusted.   Please request your Prim.MD to go over all Hospital Tests and Procedure/Radiological results at the follow up, please get all Hospital records sent to your Prim MD by signing hospital release before you go home.   If you experience worsening of your admission symptoms, develop shortness of breath, life threatening emergency, suicidal or homicidal thoughts you must seek medical attention immediately by calling 911 or calling your MD immediately  if symptoms less severe.  You Must read complete instructions/literature along with all the possible adverse reactions/side effects for all the Medicines you take and that have been prescribed to you. Take any new Medicines after you have completely understood and accpet all the possible adverse reactions/side effects.   Do not drive, operate heavy machinery, perform activities at heights, swimming or participation in water activities or provide baby sitting services if your were admitted for syncope or siezures until you have  seen by Primary MD or a Neurologist and advised to do so again.  Do not drive when taking Pain medications.    Do not take more than prescribed Pain, Sleep and Anxiety Medications  Special Instructions: If you have smoked or chewed Tobacco  in the last 2 yrs please stop smoking, stop any regular Alcohol  and or any Recreational drug use.  Wear Seat belts while driving.   Please note  You were cared for by a hospitalist during your hospital stay. If you have any questions about your discharge medications or the care you received while you were in the hospital after you are discharged, you can call the unit and asked to speak with the hospitalist on call if the hospitalist that took care of you is not available. Once you are discharged, your primary care physician will handle any further medical issues. Please note that NO REFILLS for any discharge medications will be authorized once you are discharged, as it is imperative that you return to your primary care physician (or establish a relationship with a primary care physician if you do not have one) for your aftercare needs so that they can reassess your need for medications and monitor your lab values.

## 2016-03-05 NOTE — Clinical Social Work Placement (Signed)
   CLINICAL SOCIAL WORK PLACEMENT  NOTE  Date:  03/05/2016  Patient Details  Name: Ashley [name] MRN: [id number] Date of Birth: [birth year]  Clinical Social Work is seeking post-discharge placement for this patient at the [place] level of care (*CSW will initial, date and re-position this form in  chart as items are completed):  Yes   Patient/family provided with [place] Work Department's list of facilities offering this level of care within the geographic area requested by the patient (or if unable, by the patient's family).  Yes   Patient/family informed of their freedom to choose among providers that offer the needed level of care, that participate in Medicare, Medicaid or managed care program needed by the patient, have an available bed and are willing to accept the patient.  Yes   Patient/family informed of [facility]'s ownership interest in [facility] and [facility], as well as of the fact that they are under no obligation to receive care at these facilities.  PASRR submitted to EDS on       PASRR number received on       Existing PASRR number confirmed on 03/04/16     FL2 transmitted to all facilities in geographic area requested by pt/family on 03/04/16     FL2 transmitted to all facilities within larger geographic area on       Patient informed that his/her managed care company has contracts with or will negotiate with certain facilities, including the following:        Yes   Patient/family informed of bed offers received.  Patient chooses bed at [place] [facility]     Physician recommends and patient chooses bed at      Patient to be transferred to [facility] [place] on 03/05/16.  Patient to be transferred to facility by [facility]     Patient family notified on 03/05/16 of transfer.  Name of family member notified:  Pt states she has already notified friend.      PHYSICIAN       Additional Comment:   Pt accepts bed at [facility]. Aware of bill at [facility] and cannot pay in full at this time.   _______________________________________________ Salome Arnt, [place] 03/05/2016, 9:33 AM [phone]

## 2016-03-05 NOTE — Progress Notes (Signed)
Patient discharged to [facility], EMS transporting patient.  Patient left with prescriptions and personal belongings.

## 2016-03-05 NOTE — Care Management Note (Signed)
Case Management Note  Patient Details  Name: Ashley [name] MRN: [id number] Date of Birth: 03-18-1932  Expected Discharge Date:    03/05/2016              Expected Discharge Plan:  [name]  In-House Referral:  Clinical Social Work  Discharge planning Services  CM Consult  Post Acute Care Choice:  NA Choice offered to:  Patient, NA  DME Arranged:    DME Agency:     HH Arranged:    [place] Agency:     Status of Service:  Completed, signed off  If discussed at [employer] of Stay Meetings, dates discussed:    Additional Comments: Pt discharged today to SNF. Pt is now agreeable and CSW has seen pt and made arrangements for placement. No CM needs.   Sherald Barge, RN 03/05/2016, 9:42 AM

## 2016-03-06 ENCOUNTER — Emergency Department (HOSPITAL_COMMUNITY): Payer: Medicare Other

## 2016-03-06 ENCOUNTER — Encounter (HOSPITAL_COMMUNITY): Payer: Self-pay

## 2016-03-06 ENCOUNTER — Emergency Department (HOSPITAL_COMMUNITY)
Admission: EM | Admit: 2016-03-06 | Discharge: 2016-03-06 | Disposition: A | Discharge status: Home / Self Care | Payer: Medicare Other | Attending: Emergency Medicine | Admitting: Emergency Medicine

## 2016-03-06 DIAGNOSIS — S301XXA Contusion of abdominal wall, initial encounter: Secondary | ICD-10-CM | POA: Diagnosis not present

## 2016-03-06 DIAGNOSIS — I10 Essential (primary) hypertension: Secondary | ICD-10-CM | POA: Insufficient documentation

## 2016-03-06 DIAGNOSIS — S40022A Contusion of left upper arm, initial encounter: Secondary | ICD-10-CM | POA: Diagnosis not present

## 2016-03-06 DIAGNOSIS — Z79899 Other long term (current) drug therapy: Secondary | ICD-10-CM | POA: Diagnosis not present

## 2016-03-06 DIAGNOSIS — Z7982 Long term (current) use of aspirin: Secondary | ICD-10-CM | POA: Diagnosis not present

## 2016-03-06 DIAGNOSIS — D500 Iron deficiency anemia secondary to blood loss (chronic): Secondary | ICD-10-CM | POA: Diagnosis not present

## 2016-03-06 DIAGNOSIS — S7002XA Contusion of left hip, initial encounter: Secondary | ICD-10-CM | POA: Insufficient documentation

## 2016-03-06 DIAGNOSIS — Y999 Unspecified external cause status: Secondary | ICD-10-CM | POA: Insufficient documentation

## 2016-03-06 DIAGNOSIS — E039 Hypothyroidism, unspecified: Secondary | ICD-10-CM | POA: Insufficient documentation

## 2016-03-06 DIAGNOSIS — S299XXA Unspecified injury of thorax, initial encounter: Secondary | ICD-10-CM | POA: Diagnosis not present

## 2016-03-06 DIAGNOSIS — Y939 Activity, unspecified: Secondary | ICD-10-CM | POA: Insufficient documentation

## 2016-03-06 DIAGNOSIS — W19XXXA Unspecified fall, initial encounter: Secondary | ICD-10-CM | POA: Insufficient documentation

## 2016-03-06 DIAGNOSIS — T148XXA Other injury of unspecified body region, initial encounter: Secondary | ICD-10-CM

## 2016-03-06 DIAGNOSIS — Y929 Unspecified place or not applicable: Secondary | ICD-10-CM | POA: Insufficient documentation

## 2016-03-06 LAB — BASIC METABOLIC PANEL
Anion gap: 3 — ABNORMAL LOW (ref 5–15)
BUN: 13 mg/dL (ref 6–20)
CHLORIDE: 103 mmol/L (ref 101–111)
CO2: 32 mmol/L (ref 22–32)
CREATININE: 0.68 mg/dL (ref 0.44–1.00)
Calcium: 9 mg/dL (ref 8.9–10.3)
GFR calc Af Amer: >60 mL/min (ref >60)
GFR calc non Af Amer: >60 mL/min (ref >60)
GLUCOSE: 114 mg/dL — AB (ref 65–99)
Potassium: 4.5 mmol/L (ref 3.5–5.1)
SODIUM: 138 mmol/L (ref 135–145)

## 2016-03-06 LAB — CBC WITH DIFFERENTIAL/PLATELET
Basophils Absolute: 0 10*3/uL (ref 0.0–0.1)
Basophils Relative: 0 %
EOS ABS: 0.3 10*3/uL (ref 0.0–0.7)
Eosinophils Relative: 5 %
HEMATOCRIT: 24.3 % — AB (ref 36.0–46.0)
HEMOGLOBIN: 7.6 g/dL — AB (ref 12.0–15.0)
LYMPHS ABS: 0.9 10*3/uL (ref 0.7–4.0)
Lymphocytes Relative: 15 %
MCH: 30.2 pg (ref 26.0–34.0)
MCHC: 31.3 g/dL (ref 30.0–36.0)
MCV: 96.4 fL (ref 78.0–100.0)
MONOS PCT: 7 %
Monocytes Absolute: 0.4 10*3/uL (ref 0.1–1.0)
NEUTROS PCT: 74 %
Neutro Abs: 4.6 10*3/uL (ref 1.7–7.7)
Platelets: 99 10*3/uL — ABNORMAL LOW (ref 150–400)
RBC: 2.52 MIL/uL — ABNORMAL LOW (ref 3.87–5.11)
RDW: 14.3 % (ref 11.5–15.5)
WBC: 6.2 10*3/uL (ref 4.0–10.5)

## 2016-03-06 LAB — POC OCCULT BLOOD, ED: FECAL OCCULT BLD: NEGATIVE

## 2016-03-06 MED ORDER — FERROUS SULFATE 325 (65 FE) MG PO TABS: 325.0000 mg | ORAL_TABLET | Freq: Every day | ORAL | 0 refills | Status: AC

## 2016-03-06 NOTE — Discharge Instructions (Signed)
Hemoglobin is decreased from the last time he were in the hospital. This is the result of the large bruises on your arm and thigh. There is no evidence of intestinal bleeding. Start taking an iron supplement.  Return as needed for worsening symptoms

## 2016-03-06 NOTE — ED Notes (Signed)
Report given to Danielle at [facility]; EMS contacted and waiting on transport back to facility

## 2016-03-06 NOTE — ED Notes (Signed)
Pt assisted to [facility] and back to bed, occult sample obtained and resulted; Dr. Tomi Bamberger made aware of results

## 2016-03-06 NOTE — ED Notes (Signed)
Pt gone to xray with [facility].

## 2016-03-06 NOTE — ED Provider Notes (Signed)
[facility] DEPT Provider Note   CSN: [id number] Arrival date & time: 03/06/16  1718     History   Chief Complaint Chief Complaint  Patient presents with  . Labs Only    HPI Ashley [name] is a 82 y.o. female.  HPI Patient was sent to the emergency room to have her hemoglobin rechecked. The patient is currently residing in a nursing facility. She was discharged from the hospital yesterday after an injury resulting in a humerus fracture as well as rib fractures.  The patient had routine laboratory tests checked at the nursing facility today. The blood tests showed that her hemoglobin had dropped to 7 from 11. She was sent to the emergency room to have that rechecked. The patient denies any blood in her stool. She denies any trouble with any chest pain or abdominal pain. She states she actually feels okay. No different than when she left the hospital. Past Medical History:  Diagnosis Date  . Acid reflux   . Arthritis   . At risk for falls   . Back pain   . Bilateral ovarian cysts 02/20/2012  . Cancer ([place])   . Edema   . Hypertension   . Left-sided breast cancer 11/14/2011   Started Arimidex on 07/25/2009. Stage IB, grade 1, ER 99%, PR 100%, Her2 negative, left-sided breast cancer, 8 mm in size, S/P lumpectomy on 04/24/2010, and sentinel node biopsy.  Ki-67 marker 6%.  . Osteoporosis 03/06/2012   Switched from Arimidex to Tamoxifen as a result of osteoporosis   . Thyroid disease     Patient Active Problem List   Diagnosis Date Noted  . Multiple rib fractures 03/03/2016  . Multiple closed fractures of ribs of left side   . Closed nondisplaced fracture of surgical neck of left humerus   . Rib fractures 03/02/2016  . Hypoxia 03/02/2016  . Humerus shaft fracture 03/02/2016  . Fall as cause of accidental injury at home as place of occurrence 03/02/2016  . Iron deficiency anemia 07/08/2014  . Other pancytopenia ([place]) 05/29/2014  . Osteoporosis 03/06/2012  . Bilateral ovarian  cysts 02/20/2012  . Left-sided breast cancer 11/14/2011  . Hypothyroidism 03/10/2007  . Essential hypertension 03/10/2007  . GERD 03/10/2007    Past Surgical History:  Procedure Laterality Date  . APPENDECTOMY    . BREAST SURGERY    . CHOLECYSTECTOMY    . HERNIA REPAIR    . HIP FRACTURE SURGERY    . TONSILLECTOMY    . TOTAL KNEE REVISION      OB History    No data available       Home Medications    Prior to Admission medications   Medication Sig Start Date End Date Taking? Authorizing Provider  ALPRAZolam Duanne Moron) 0.5 MG tablet Take 0.5-1 tablets (0.25-0.5 mg total) by mouth 3 (three) times daily as needed for anxiety. 03/05/16  Yes Thurnell Lose, MD  aspirin EC 81 MG tablet Take 81 mg by mouth daily with lunch.   Yes Historical Provider, MD  bisacodyl (DULCOLAX) 5 MG EC tablet Take 1 tablet (5 mg total) by mouth daily as needed for moderate constipation. 03/05/16  Yes Thurnell Lose, MD  HYDROcodone-acetaminophen (NORCO) 10-325 MG tablet Take 1 tablet by mouth every 6 (six) hours as needed for moderate pain. 03/05/16  Yes Thurnell Lose, MD  levothyroxine (SYNTHROID, LEVOTHROID) 75 MCG tablet Take 75 mcg by mouth daily before breakfast.   Yes Historical Provider, MD  metoprolol succinate (TOPROL-XL) 25 MG  24 hr tablet Take 1 tablet (25 mg total) by mouth daily. 03/05/16  Yes Thurnell Lose, MD  nitroGLYCERIN (NITROSTAT) 0.4 MG SL tablet Place 0.4 mg under the tongue every 5 (five) minutes as needed for chest pain.    Yes Historical Provider, MD  omeprazole (PRILOSEC) 20 MG capsule Take 20 mg by mouth daily.    Yes Historical Provider, MD  potassium chloride SA (K-DUR,KLOR-CON) 20 MEQ tablet Take 20 mEq by mouth daily.   Yes Historical Provider, MD  triamcinolone cream (KENALOG) 0.1 % Apply 1 application topically 2 (two) times daily. Pt applies to legs.   Yes Historical Provider, MD  ferrous sulfate 325 (65 FE) MG tablet Take 1 tablet (325 mg total) by mouth daily.  03/06/16   Dorie Rank, MD    Family History No family history on file.  Social History Social History  Substance Use Topics  . Smoking status: Never Smoker  . Smokeless tobacco: Never Used  . Alcohol use No     Allergies   Ciprofloxacin; Codeine phosphate; Doxycycline; Erythromycin ethylsuccinate; Levofloxacin; Penicillins; Sulfamethoxazole; Tetracycline hcl; and Lidocaine   Review of Systems Review of Systems  All other systems reviewed and are negative.    Physical Exam Updated Vital Signs BP 113/98 (BP Location: Right Arm)   Pulse 98   Temp 98.6 F (37 C) (Oral)   Resp 22   Ht 4' 4" (1.321 m)   Wt 48.5 kg   SpO2 99%   BMI 27.82 kg/m   Physical Exam  Constitutional: No distress.  Elderly   HENT:  Head: Normocephalic and atraumatic.  Right Ear: External ear normal.  Left Ear: External ear normal.  Eyes: Conjunctivae are normal. Right eye exhibits no discharge. Left eye exhibits no discharge. No scleral icterus.  Neck: Neck supple. No tracheal deviation present.  Cardiovascular: Normal rate, regular rhythm and intact distal pulses.   Pulmonary/Chest: Effort normal and breath sounds normal. No stridor. No respiratory distress. She has no wheezes. She has no rales.  Abdominal: Soft. Bowel sounds are normal. She exhibits no distension. There is no tenderness. There is no rebound and no guarding.  Musculoskeletal: She exhibits no edema.  Left arm in a sling, large bruise in the left upper extremity, large hematoma, ecchymoses in the left hip, flank area   Neurological: She is alert. She has normal strength. No cranial nerve deficit (no facial droop, extraocular movements intact, no slurred speech) or sensory deficit. She exhibits normal muscle tone. She displays no seizure activity. Coordination normal.  Skin: Skin is warm and dry. No rash noted. She is not diaphoretic.  Psychiatric: She has a normal mood and affect.  Nursing note and vitals reviewed.    ED  Treatments / Results  Labs (all labs ordered are listed, but only abnormal results are displayed) Labs Reviewed  CBC WITH DIFFERENTIAL/PLATELET - Abnormal; Notable for the following:       Result Value   RBC 2.52 (*)    Hemoglobin 7.6 (*)    HCT 24.3 (*)    Platelets 99 (*)    All other components within normal limits  BASIC METABOLIC PANEL - Abnormal; Notable for the following:    Glucose, Bld 114 (*)    Anion gap 3 (*)    All other components within normal limits  POC OCCULT BLOOD, ED  POC OCCULT BLOOD, ED    Radiology Dg Chest 2 View  Result Date: 03/06/2016 CLINICAL DATA:  Recent fall EXAM: CHEST  2  VIEW COMPARISON:  03/02/2016 FINDINGS: Cardiac shadow is stable. Chronic changes are noted in both lungs similar to that seen on the prior exam. Lateral left rib fractures are again noted. No acute infiltrate is seen. No acute bony abnormality is noted. IMPRESSION: Stable appearance from the previous exam. Electronically Signed   By: Inez Catalina M.D.   On: 03/06/2016 19:28    Procedures Procedures (including critical care time)  Medications Ordered in ED Medications - No data to display   Initial Impression / Assessment and Plan / ED Course  I have reviewed the triage vital signs and the nursing notes.  Pertinent labs & imaging results that were available during my care of the patient were reviewed by me and considered in my medical decision making (see chart for details).  Clinical Course   Pt has a large bruise in her left flank and arm.  I suspect this is the source of her drop in hemoglobin.  She is not having any active bleeding.  Hemocult is negative.  Pt is asymptomatic.  I do not think she requires a blood transfusion.  Will have her take an iron supplement.  Follow up with PCP  Final Clinical Impressions(s) / ED Diagnoses   Final diagnoses:  Hematoma  Blood loss anemia    New Prescriptions New Prescriptions   FERROUS SULFATE 325 (65 FE) MG TABLET    Take 1  tablet (325 mg total) by mouth daily.     Dorie Rank, MD 03/06/16 2135

## 2016-03-06 NOTE — ED Triage Notes (Signed)
Pt here from [facility] for repeat hemoglobin. States hemoglobin was 11 yesterday at discharge from the hospital and result of hemoglobin today was 7.6.

## 2016-03-07 DIAGNOSIS — S42213A Unspecified displaced fracture of surgical neck of unspecified humerus, initial encounter for closed fracture: Secondary | ICD-10-CM | POA: Diagnosis not present

## 2016-03-07 DIAGNOSIS — S2249XA Multiple fractures of ribs, unspecified side, initial encounter for closed fracture: Secondary | ICD-10-CM | POA: Diagnosis not present

## 2016-03-07 DIAGNOSIS — N390 Urinary tract infection, site not specified: Secondary | ICD-10-CM | POA: Diagnosis not present

## 2016-03-07 DIAGNOSIS — E039 Hypothyroidism, unspecified: Secondary | ICD-10-CM | POA: Diagnosis not present

## 2016-03-13 DIAGNOSIS — E039 Hypothyroidism, unspecified: Secondary | ICD-10-CM | POA: Diagnosis not present

## 2016-03-13 DIAGNOSIS — D62 Acute posthemorrhagic anemia: Secondary | ICD-10-CM | POA: Diagnosis not present

## 2016-03-13 DIAGNOSIS — S42213A Unspecified displaced fracture of surgical neck of unspecified humerus, initial encounter for closed fracture: Secondary | ICD-10-CM | POA: Diagnosis not present

## 2016-03-13 DIAGNOSIS — S2249XA Multiple fractures of ribs, unspecified side, initial encounter for closed fracture: Secondary | ICD-10-CM | POA: Diagnosis not present

## 2016-03-18 ENCOUNTER — Ambulatory Visit (INDEPENDENT_AMBULATORY_CARE_PROVIDER_SITE_OTHER): Payer: Medicare Other | Admitting: Orthopedic Surgery

## 2016-03-18 ENCOUNTER — Encounter: Payer: Self-pay | Admitting: Orthopedic Surgery

## 2016-03-18 ENCOUNTER — Ambulatory Visit (INDEPENDENT_AMBULATORY_CARE_PROVIDER_SITE_OTHER): Payer: Medicare Other

## 2016-03-18 DIAGNOSIS — S4292XD Fracture of left shoulder girdle, part unspecified, subsequent encounter for fracture with routine healing: Secondary | ICD-10-CM

## 2016-03-18 NOTE — Patient Instructions (Signed)
Start physical therapy  Follow-up 2 months

## 2016-03-18 NOTE — Progress Notes (Signed)
Patient ID: Ashley [name], female   DOB: 01-10-1932, 82 y.o.   MRN: [id number]  Chief Complaint  Patient presents with  . Shoulder Injury    left humeral neck fracture, DOI 03/02/16    HPI Ashley [name] is a 82 y.o. female.   HPI  Ashley [name] is an 82 y.o. female.  HPI: 82 year old female fell mechanical fall injured her left shoulder sustained a proximal impacted left humerus fracture. Complains of mild discomfort no deformity mild bruising mild swelling and loss of motion in the left shoulder     Review of Systems Review of Systems   Physical Exam There were no vitals taken for this visit.   Physical Exam  No tenderness at the fracture site  Today's x-ray shows healing proximal humerus fracture minimal angulation no displacement  Recommend physical therapy and return in 8 weeks

## 2016-03-22 DIAGNOSIS — I10 Essential (primary) hypertension: Secondary | ICD-10-CM | POA: Diagnosis not present

## 2016-03-22 DIAGNOSIS — S2249XA Multiple fractures of ribs, unspecified side, initial encounter for closed fracture: Secondary | ICD-10-CM | POA: Diagnosis not present

## 2016-03-22 DIAGNOSIS — E039 Hypothyroidism, unspecified: Secondary | ICD-10-CM | POA: Diagnosis not present

## 2016-03-22 DIAGNOSIS — S42213A Unspecified displaced fracture of surgical neck of unspecified humerus, initial encounter for closed fracture: Secondary | ICD-10-CM | POA: Diagnosis not present

## 2016-03-25 ENCOUNTER — Telehealth: Payer: Self-pay | Admitting: Orthopedic Surgery

## 2016-03-25 NOTE — Telephone Encounter (Signed)
Call received from Cleta Alberts, Rehab director for [facility] at [facility] - asking for clarification of orders regarding physical therapy and starting of range of motion exercises.  Please call at [phone].

## 2016-03-25 NOTE — Telephone Encounter (Signed)
ROUTING TO DR HARRISON 

## 2016-03-25 NOTE — Telephone Encounter (Signed)
Physical therapy orders  Start active and active assisted range of motion exercises as tolerated  Weightbearing as tolerated  Strengthening exercises as tolerated

## 2016-03-25 NOTE — Telephone Encounter (Signed)
RETURNED CALL, NO ANSWER, LEFT VM 

## 2016-03-27 DIAGNOSIS — R0902 Hypoxemia: Secondary | ICD-10-CM | POA: Diagnosis not present

## 2016-03-27 DIAGNOSIS — D62 Acute posthemorrhagic anemia: Secondary | ICD-10-CM | POA: Diagnosis not present

## 2016-03-27 DIAGNOSIS — S2249XA Multiple fractures of ribs, unspecified side, initial encounter for closed fracture: Secondary | ICD-10-CM | POA: Diagnosis not present

## 2016-03-27 DIAGNOSIS — I10 Essential (primary) hypertension: Secondary | ICD-10-CM | POA: Diagnosis not present

## 2016-03-27 NOTE — Telephone Encounter (Signed)
Verbal orders given to Casco in rehab at [facility], verbally and faxed

## 2016-04-10 DIAGNOSIS — D62 Acute posthemorrhagic anemia: Secondary | ICD-10-CM | POA: Diagnosis not present

## 2016-04-10 DIAGNOSIS — D696 Thrombocytopenia, unspecified: Secondary | ICD-10-CM | POA: Diagnosis not present

## 2016-04-10 DIAGNOSIS — S42213A Unspecified displaced fracture of surgical neck of unspecified humerus, initial encounter for closed fracture: Secondary | ICD-10-CM | POA: Diagnosis not present

## 2016-04-12 DIAGNOSIS — J449 Chronic obstructive pulmonary disease, unspecified: Secondary | ICD-10-CM | POA: Diagnosis not present

## 2016-04-12 DIAGNOSIS — S42213A Unspecified displaced fracture of surgical neck of unspecified humerus, initial encounter for closed fracture: Secondary | ICD-10-CM | POA: Diagnosis not present

## 2016-04-12 DIAGNOSIS — E039 Hypothyroidism, unspecified: Secondary | ICD-10-CM | POA: Diagnosis not present

## 2016-04-12 DIAGNOSIS — I10 Essential (primary) hypertension: Secondary | ICD-10-CM | POA: Diagnosis not present

## 2016-04-21 DIAGNOSIS — S2242XD Multiple fractures of ribs, left side, subsequent encounter for fracture with routine healing: Secondary | ICD-10-CM | POA: Diagnosis not present

## 2016-04-21 DIAGNOSIS — D509 Iron deficiency anemia, unspecified: Secondary | ICD-10-CM | POA: Diagnosis not present

## 2016-04-21 DIAGNOSIS — Z853 Personal history of malignant neoplasm of breast: Secondary | ICD-10-CM | POA: Diagnosis not present

## 2016-04-21 DIAGNOSIS — Z79891 Long term (current) use of opiate analgesic: Secondary | ICD-10-CM | POA: Diagnosis not present

## 2016-04-21 DIAGNOSIS — Z9181 History of falling: Secondary | ICD-10-CM | POA: Diagnosis not present

## 2016-04-21 DIAGNOSIS — Z7982 Long term (current) use of aspirin: Secondary | ICD-10-CM | POA: Diagnosis not present

## 2016-04-21 DIAGNOSIS — Z9981 Dependence on supplemental oxygen: Secondary | ICD-10-CM | POA: Diagnosis not present

## 2016-04-21 DIAGNOSIS — K219 Gastro-esophageal reflux disease without esophagitis: Secondary | ICD-10-CM | POA: Diagnosis not present

## 2016-04-21 DIAGNOSIS — M810 Age-related osteoporosis without current pathological fracture: Secondary | ICD-10-CM | POA: Diagnosis not present

## 2016-04-21 DIAGNOSIS — E039 Hypothyroidism, unspecified: Secondary | ICD-10-CM | POA: Diagnosis not present

## 2016-04-21 DIAGNOSIS — M6281 Muscle weakness (generalized): Secondary | ICD-10-CM | POA: Diagnosis not present

## 2016-04-21 DIAGNOSIS — I10 Essential (primary) hypertension: Secondary | ICD-10-CM | POA: Diagnosis not present

## 2016-04-21 DIAGNOSIS — S42215D Unspecified nondisplaced fracture of surgical neck of left humerus, subsequent encounter for fracture with routine healing: Secondary | ICD-10-CM | POA: Diagnosis not present

## 2016-04-22 DIAGNOSIS — Z9181 History of falling: Secondary | ICD-10-CM | POA: Diagnosis not present

## 2016-04-22 DIAGNOSIS — M6281 Muscle weakness (generalized): Secondary | ICD-10-CM | POA: Diagnosis not present

## 2016-04-22 DIAGNOSIS — Z79891 Long term (current) use of opiate analgesic: Secondary | ICD-10-CM | POA: Diagnosis not present

## 2016-04-22 DIAGNOSIS — E039 Hypothyroidism, unspecified: Secondary | ICD-10-CM | POA: Diagnosis not present

## 2016-04-22 DIAGNOSIS — Z9981 Dependence on supplemental oxygen: Secondary | ICD-10-CM | POA: Diagnosis not present

## 2016-04-22 DIAGNOSIS — K219 Gastro-esophageal reflux disease without esophagitis: Secondary | ICD-10-CM | POA: Diagnosis not present

## 2016-04-22 DIAGNOSIS — S2242XD Multiple fractures of ribs, left side, subsequent encounter for fracture with routine healing: Secondary | ICD-10-CM | POA: Diagnosis not present

## 2016-04-22 DIAGNOSIS — S42215D Unspecified nondisplaced fracture of surgical neck of left humerus, subsequent encounter for fracture with routine healing: Secondary | ICD-10-CM | POA: Diagnosis not present

## 2016-04-22 DIAGNOSIS — I10 Essential (primary) hypertension: Secondary | ICD-10-CM | POA: Diagnosis not present

## 2016-04-22 DIAGNOSIS — Z853 Personal history of malignant neoplasm of breast: Secondary | ICD-10-CM | POA: Diagnosis not present

## 2016-04-22 DIAGNOSIS — D509 Iron deficiency anemia, unspecified: Secondary | ICD-10-CM | POA: Diagnosis not present

## 2016-04-22 DIAGNOSIS — Z7982 Long term (current) use of aspirin: Secondary | ICD-10-CM | POA: Diagnosis not present

## 2016-04-22 DIAGNOSIS — M810 Age-related osteoporosis without current pathological fracture: Secondary | ICD-10-CM | POA: Diagnosis not present

## 2016-04-25 DIAGNOSIS — Z79891 Long term (current) use of opiate analgesic: Secondary | ICD-10-CM | POA: Diagnosis not present

## 2016-04-25 DIAGNOSIS — M810 Age-related osteoporosis without current pathological fracture: Secondary | ICD-10-CM | POA: Diagnosis not present

## 2016-04-25 DIAGNOSIS — S2242XD Multiple fractures of ribs, left side, subsequent encounter for fracture with routine healing: Secondary | ICD-10-CM | POA: Diagnosis not present

## 2016-04-25 DIAGNOSIS — Z7982 Long term (current) use of aspirin: Secondary | ICD-10-CM | POA: Diagnosis not present

## 2016-04-25 DIAGNOSIS — I10 Essential (primary) hypertension: Secondary | ICD-10-CM | POA: Diagnosis not present

## 2016-04-25 DIAGNOSIS — E039 Hypothyroidism, unspecified: Secondary | ICD-10-CM | POA: Diagnosis not present

## 2016-04-25 DIAGNOSIS — K219 Gastro-esophageal reflux disease without esophagitis: Secondary | ICD-10-CM | POA: Diagnosis not present

## 2016-04-25 DIAGNOSIS — D509 Iron deficiency anemia, unspecified: Secondary | ICD-10-CM | POA: Diagnosis not present

## 2016-04-25 DIAGNOSIS — Z853 Personal history of malignant neoplasm of breast: Secondary | ICD-10-CM | POA: Diagnosis not present

## 2016-04-25 DIAGNOSIS — M6281 Muscle weakness (generalized): Secondary | ICD-10-CM | POA: Diagnosis not present

## 2016-04-25 DIAGNOSIS — Z9981 Dependence on supplemental oxygen: Secondary | ICD-10-CM | POA: Diagnosis not present

## 2016-04-25 DIAGNOSIS — S42215D Unspecified nondisplaced fracture of surgical neck of left humerus, subsequent encounter for fracture with routine healing: Secondary | ICD-10-CM | POA: Diagnosis not present

## 2016-04-25 DIAGNOSIS — Z9181 History of falling: Secondary | ICD-10-CM | POA: Diagnosis not present

## 2016-04-29 DIAGNOSIS — Z7982 Long term (current) use of aspirin: Secondary | ICD-10-CM | POA: Diagnosis not present

## 2016-04-29 DIAGNOSIS — Z9981 Dependence on supplemental oxygen: Secondary | ICD-10-CM | POA: Diagnosis not present

## 2016-04-29 DIAGNOSIS — S2242XD Multiple fractures of ribs, left side, subsequent encounter for fracture with routine healing: Secondary | ICD-10-CM | POA: Diagnosis not present

## 2016-04-29 DIAGNOSIS — M810 Age-related osteoporosis without current pathological fracture: Secondary | ICD-10-CM | POA: Diagnosis not present

## 2016-04-29 DIAGNOSIS — S42215D Unspecified nondisplaced fracture of surgical neck of left humerus, subsequent encounter for fracture with routine healing: Secondary | ICD-10-CM | POA: Diagnosis not present

## 2016-04-29 DIAGNOSIS — Z853 Personal history of malignant neoplasm of breast: Secondary | ICD-10-CM | POA: Diagnosis not present

## 2016-04-29 DIAGNOSIS — K219 Gastro-esophageal reflux disease without esophagitis: Secondary | ICD-10-CM | POA: Diagnosis not present

## 2016-04-29 DIAGNOSIS — D509 Iron deficiency anemia, unspecified: Secondary | ICD-10-CM | POA: Diagnosis not present

## 2016-04-29 DIAGNOSIS — Z79891 Long term (current) use of opiate analgesic: Secondary | ICD-10-CM | POA: Diagnosis not present

## 2016-04-29 DIAGNOSIS — I10 Essential (primary) hypertension: Secondary | ICD-10-CM | POA: Diagnosis not present

## 2016-04-29 DIAGNOSIS — M6281 Muscle weakness (generalized): Secondary | ICD-10-CM | POA: Diagnosis not present

## 2016-04-29 DIAGNOSIS — Z9181 History of falling: Secondary | ICD-10-CM | POA: Diagnosis not present

## 2016-04-29 DIAGNOSIS — E039 Hypothyroidism, unspecified: Secondary | ICD-10-CM | POA: Diagnosis not present

## 2016-04-30 DIAGNOSIS — I10 Essential (primary) hypertension: Secondary | ICD-10-CM | POA: Diagnosis not present

## 2016-04-30 DIAGNOSIS — Z9981 Dependence on supplemental oxygen: Secondary | ICD-10-CM | POA: Diagnosis not present

## 2016-04-30 DIAGNOSIS — M6281 Muscle weakness (generalized): Secondary | ICD-10-CM | POA: Diagnosis not present

## 2016-04-30 DIAGNOSIS — Z9181 History of falling: Secondary | ICD-10-CM | POA: Diagnosis not present

## 2016-04-30 DIAGNOSIS — D509 Iron deficiency anemia, unspecified: Secondary | ICD-10-CM | POA: Diagnosis not present

## 2016-04-30 DIAGNOSIS — Z7982 Long term (current) use of aspirin: Secondary | ICD-10-CM | POA: Diagnosis not present

## 2016-04-30 DIAGNOSIS — S2242XD Multiple fractures of ribs, left side, subsequent encounter for fracture with routine healing: Secondary | ICD-10-CM | POA: Diagnosis not present

## 2016-04-30 DIAGNOSIS — E039 Hypothyroidism, unspecified: Secondary | ICD-10-CM | POA: Diagnosis not present

## 2016-04-30 DIAGNOSIS — M810 Age-related osteoporosis without current pathological fracture: Secondary | ICD-10-CM | POA: Diagnosis not present

## 2016-04-30 DIAGNOSIS — Z853 Personal history of malignant neoplasm of breast: Secondary | ICD-10-CM | POA: Diagnosis not present

## 2016-04-30 DIAGNOSIS — Z79891 Long term (current) use of opiate analgesic: Secondary | ICD-10-CM | POA: Diagnosis not present

## 2016-04-30 DIAGNOSIS — K219 Gastro-esophageal reflux disease without esophagitis: Secondary | ICD-10-CM | POA: Diagnosis not present

## 2016-04-30 DIAGNOSIS — S42215D Unspecified nondisplaced fracture of surgical neck of left humerus, subsequent encounter for fracture with routine healing: Secondary | ICD-10-CM | POA: Diagnosis not present

## 2016-05-01 DIAGNOSIS — S42215D Unspecified nondisplaced fracture of surgical neck of left humerus, subsequent encounter for fracture with routine healing: Secondary | ICD-10-CM | POA: Diagnosis not present

## 2016-05-01 DIAGNOSIS — Z9181 History of falling: Secondary | ICD-10-CM | POA: Diagnosis not present

## 2016-05-01 DIAGNOSIS — Z9981 Dependence on supplemental oxygen: Secondary | ICD-10-CM | POA: Diagnosis not present

## 2016-05-01 DIAGNOSIS — Z7982 Long term (current) use of aspirin: Secondary | ICD-10-CM | POA: Diagnosis not present

## 2016-05-01 DIAGNOSIS — E039 Hypothyroidism, unspecified: Secondary | ICD-10-CM | POA: Diagnosis not present

## 2016-05-01 DIAGNOSIS — Z79891 Long term (current) use of opiate analgesic: Secondary | ICD-10-CM | POA: Diagnosis not present

## 2016-05-01 DIAGNOSIS — Z853 Personal history of malignant neoplasm of breast: Secondary | ICD-10-CM | POA: Diagnosis not present

## 2016-05-01 DIAGNOSIS — D509 Iron deficiency anemia, unspecified: Secondary | ICD-10-CM | POA: Diagnosis not present

## 2016-05-01 DIAGNOSIS — I10 Essential (primary) hypertension: Secondary | ICD-10-CM | POA: Diagnosis not present

## 2016-05-01 DIAGNOSIS — M6281 Muscle weakness (generalized): Secondary | ICD-10-CM | POA: Diagnosis not present

## 2016-05-01 DIAGNOSIS — S2242XD Multiple fractures of ribs, left side, subsequent encounter for fracture with routine healing: Secondary | ICD-10-CM | POA: Diagnosis not present

## 2016-05-01 DIAGNOSIS — K219 Gastro-esophageal reflux disease without esophagitis: Secondary | ICD-10-CM | POA: Diagnosis not present

## 2016-05-01 DIAGNOSIS — M810 Age-related osteoporosis without current pathological fracture: Secondary | ICD-10-CM | POA: Diagnosis not present

## 2016-05-02 DIAGNOSIS — Z7982 Long term (current) use of aspirin: Secondary | ICD-10-CM | POA: Diagnosis not present

## 2016-05-02 DIAGNOSIS — Z9181 History of falling: Secondary | ICD-10-CM | POA: Diagnosis not present

## 2016-05-02 DIAGNOSIS — Z853 Personal history of malignant neoplasm of breast: Secondary | ICD-10-CM | POA: Diagnosis not present

## 2016-05-02 DIAGNOSIS — M810 Age-related osteoporosis without current pathological fracture: Secondary | ICD-10-CM | POA: Diagnosis not present

## 2016-05-02 DIAGNOSIS — K219 Gastro-esophageal reflux disease without esophagitis: Secondary | ICD-10-CM | POA: Diagnosis not present

## 2016-05-02 DIAGNOSIS — M6281 Muscle weakness (generalized): Secondary | ICD-10-CM | POA: Diagnosis not present

## 2016-05-02 DIAGNOSIS — S42215D Unspecified nondisplaced fracture of surgical neck of left humerus, subsequent encounter for fracture with routine healing: Secondary | ICD-10-CM | POA: Diagnosis not present

## 2016-05-02 DIAGNOSIS — Z79891 Long term (current) use of opiate analgesic: Secondary | ICD-10-CM | POA: Diagnosis not present

## 2016-05-02 DIAGNOSIS — E039 Hypothyroidism, unspecified: Secondary | ICD-10-CM | POA: Diagnosis not present

## 2016-05-02 DIAGNOSIS — S2242XD Multiple fractures of ribs, left side, subsequent encounter for fracture with routine healing: Secondary | ICD-10-CM | POA: Diagnosis not present

## 2016-05-02 DIAGNOSIS — Z9981 Dependence on supplemental oxygen: Secondary | ICD-10-CM | POA: Diagnosis not present

## 2016-05-02 DIAGNOSIS — I10 Essential (primary) hypertension: Secondary | ICD-10-CM | POA: Diagnosis not present

## 2016-05-02 DIAGNOSIS — D509 Iron deficiency anemia, unspecified: Secondary | ICD-10-CM | POA: Diagnosis not present

## 2016-05-03 DIAGNOSIS — Z7982 Long term (current) use of aspirin: Secondary | ICD-10-CM | POA: Diagnosis not present

## 2016-05-03 DIAGNOSIS — Z853 Personal history of malignant neoplasm of breast: Secondary | ICD-10-CM | POA: Diagnosis not present

## 2016-05-03 DIAGNOSIS — S2242XD Multiple fractures of ribs, left side, subsequent encounter for fracture with routine healing: Secondary | ICD-10-CM | POA: Diagnosis not present

## 2016-05-03 DIAGNOSIS — Z9181 History of falling: Secondary | ICD-10-CM | POA: Diagnosis not present

## 2016-05-03 DIAGNOSIS — K219 Gastro-esophageal reflux disease without esophagitis: Secondary | ICD-10-CM | POA: Diagnosis not present

## 2016-05-03 DIAGNOSIS — D509 Iron deficiency anemia, unspecified: Secondary | ICD-10-CM | POA: Diagnosis not present

## 2016-05-03 DIAGNOSIS — M810 Age-related osteoporosis without current pathological fracture: Secondary | ICD-10-CM | POA: Diagnosis not present

## 2016-05-03 DIAGNOSIS — Z9981 Dependence on supplemental oxygen: Secondary | ICD-10-CM | POA: Diagnosis not present

## 2016-05-03 DIAGNOSIS — S42215D Unspecified nondisplaced fracture of surgical neck of left humerus, subsequent encounter for fracture with routine healing: Secondary | ICD-10-CM | POA: Diagnosis not present

## 2016-05-03 DIAGNOSIS — I10 Essential (primary) hypertension: Secondary | ICD-10-CM | POA: Diagnosis not present

## 2016-05-03 DIAGNOSIS — E039 Hypothyroidism, unspecified: Secondary | ICD-10-CM | POA: Diagnosis not present

## 2016-05-03 DIAGNOSIS — Z79891 Long term (current) use of opiate analgesic: Secondary | ICD-10-CM | POA: Diagnosis not present

## 2016-05-03 DIAGNOSIS — M6281 Muscle weakness (generalized): Secondary | ICD-10-CM | POA: Diagnosis not present

## 2016-05-06 DIAGNOSIS — M810 Age-related osteoporosis without current pathological fracture: Secondary | ICD-10-CM | POA: Diagnosis not present

## 2016-05-06 DIAGNOSIS — Z9981 Dependence on supplemental oxygen: Secondary | ICD-10-CM | POA: Diagnosis not present

## 2016-05-06 DIAGNOSIS — D509 Iron deficiency anemia, unspecified: Secondary | ICD-10-CM | POA: Diagnosis not present

## 2016-05-06 DIAGNOSIS — Z853 Personal history of malignant neoplasm of breast: Secondary | ICD-10-CM | POA: Diagnosis not present

## 2016-05-06 DIAGNOSIS — S42215D Unspecified nondisplaced fracture of surgical neck of left humerus, subsequent encounter for fracture with routine healing: Secondary | ICD-10-CM | POA: Diagnosis not present

## 2016-05-06 DIAGNOSIS — E039 Hypothyroidism, unspecified: Secondary | ICD-10-CM | POA: Diagnosis not present

## 2016-05-06 DIAGNOSIS — K219 Gastro-esophageal reflux disease without esophagitis: Secondary | ICD-10-CM | POA: Diagnosis not present

## 2016-05-06 DIAGNOSIS — Z9181 History of falling: Secondary | ICD-10-CM | POA: Diagnosis not present

## 2016-05-06 DIAGNOSIS — Z79891 Long term (current) use of opiate analgesic: Secondary | ICD-10-CM | POA: Diagnosis not present

## 2016-05-06 DIAGNOSIS — I10 Essential (primary) hypertension: Secondary | ICD-10-CM | POA: Diagnosis not present

## 2016-05-06 DIAGNOSIS — S2242XD Multiple fractures of ribs, left side, subsequent encounter for fracture with routine healing: Secondary | ICD-10-CM | POA: Diagnosis not present

## 2016-05-06 DIAGNOSIS — M6281 Muscle weakness (generalized): Secondary | ICD-10-CM | POA: Diagnosis not present

## 2016-05-06 DIAGNOSIS — Z7982 Long term (current) use of aspirin: Secondary | ICD-10-CM | POA: Diagnosis not present

## 2016-05-08 DIAGNOSIS — Z9981 Dependence on supplemental oxygen: Secondary | ICD-10-CM | POA: Diagnosis not present

## 2016-05-08 DIAGNOSIS — E039 Hypothyroidism, unspecified: Secondary | ICD-10-CM | POA: Diagnosis not present

## 2016-05-08 DIAGNOSIS — Z79891 Long term (current) use of opiate analgesic: Secondary | ICD-10-CM | POA: Diagnosis not present

## 2016-05-08 DIAGNOSIS — S42215D Unspecified nondisplaced fracture of surgical neck of left humerus, subsequent encounter for fracture with routine healing: Secondary | ICD-10-CM | POA: Diagnosis not present

## 2016-05-08 DIAGNOSIS — Z7982 Long term (current) use of aspirin: Secondary | ICD-10-CM | POA: Diagnosis not present

## 2016-05-08 DIAGNOSIS — M6281 Muscle weakness (generalized): Secondary | ICD-10-CM | POA: Diagnosis not present

## 2016-05-08 DIAGNOSIS — I10 Essential (primary) hypertension: Secondary | ICD-10-CM | POA: Diagnosis not present

## 2016-05-08 DIAGNOSIS — K219 Gastro-esophageal reflux disease without esophagitis: Secondary | ICD-10-CM | POA: Diagnosis not present

## 2016-05-08 DIAGNOSIS — D509 Iron deficiency anemia, unspecified: Secondary | ICD-10-CM | POA: Diagnosis not present

## 2016-05-08 DIAGNOSIS — M810 Age-related osteoporosis without current pathological fracture: Secondary | ICD-10-CM | POA: Diagnosis not present

## 2016-05-08 DIAGNOSIS — Z853 Personal history of malignant neoplasm of breast: Secondary | ICD-10-CM | POA: Diagnosis not present

## 2016-05-08 DIAGNOSIS — Z9181 History of falling: Secondary | ICD-10-CM | POA: Diagnosis not present

## 2016-05-08 DIAGNOSIS — S2242XD Multiple fractures of ribs, left side, subsequent encounter for fracture with routine healing: Secondary | ICD-10-CM | POA: Diagnosis not present

## 2016-05-09 DIAGNOSIS — Z7982 Long term (current) use of aspirin: Secondary | ICD-10-CM | POA: Diagnosis not present

## 2016-05-09 DIAGNOSIS — Z9981 Dependence on supplemental oxygen: Secondary | ICD-10-CM | POA: Diagnosis not present

## 2016-05-09 DIAGNOSIS — S42215D Unspecified nondisplaced fracture of surgical neck of left humerus, subsequent encounter for fracture with routine healing: Secondary | ICD-10-CM | POA: Diagnosis not present

## 2016-05-09 DIAGNOSIS — M810 Age-related osteoporosis without current pathological fracture: Secondary | ICD-10-CM | POA: Diagnosis not present

## 2016-05-09 DIAGNOSIS — D509 Iron deficiency anemia, unspecified: Secondary | ICD-10-CM | POA: Diagnosis not present

## 2016-05-09 DIAGNOSIS — Z853 Personal history of malignant neoplasm of breast: Secondary | ICD-10-CM | POA: Diagnosis not present

## 2016-05-09 DIAGNOSIS — S2242XD Multiple fractures of ribs, left side, subsequent encounter for fracture with routine healing: Secondary | ICD-10-CM | POA: Diagnosis not present

## 2016-05-09 DIAGNOSIS — Z9181 History of falling: Secondary | ICD-10-CM | POA: Diagnosis not present

## 2016-05-09 DIAGNOSIS — E039 Hypothyroidism, unspecified: Secondary | ICD-10-CM | POA: Diagnosis not present

## 2016-05-09 DIAGNOSIS — K219 Gastro-esophageal reflux disease without esophagitis: Secondary | ICD-10-CM | POA: Diagnosis not present

## 2016-05-09 DIAGNOSIS — Z79891 Long term (current) use of opiate analgesic: Secondary | ICD-10-CM | POA: Diagnosis not present

## 2016-05-09 DIAGNOSIS — I10 Essential (primary) hypertension: Secondary | ICD-10-CM | POA: Diagnosis not present

## 2016-05-09 DIAGNOSIS — M6281 Muscle weakness (generalized): Secondary | ICD-10-CM | POA: Diagnosis not present

## 2016-05-10 ENCOUNTER — Other Ambulatory Visit (HOSPITAL_COMMUNITY): Payer: Self-pay | Admitting: Oncology

## 2016-05-10 DIAGNOSIS — C50912 Malignant neoplasm of unspecified site of left female breast: Secondary | ICD-10-CM

## 2016-05-13 ENCOUNTER — Other Ambulatory Visit (HOSPITAL_COMMUNITY): Payer: Self-pay | Admitting: Oncology

## 2016-05-13 ENCOUNTER — Encounter: Payer: Self-pay | Admitting: Orthopedic Surgery

## 2016-05-13 ENCOUNTER — Ambulatory Visit (INDEPENDENT_AMBULATORY_CARE_PROVIDER_SITE_OTHER): Payer: Medicare Other | Admitting: Orthopedic Surgery

## 2016-05-13 DIAGNOSIS — Z9889 Other specified postprocedural states: Secondary | ICD-10-CM

## 2016-05-13 DIAGNOSIS — S4292XD Fracture of left shoulder girdle, part unspecified, subsequent encounter for fracture with routine healing: Secondary | ICD-10-CM

## 2016-05-13 DIAGNOSIS — M150 Primary generalized (osteo)arthritis: Secondary | ICD-10-CM | POA: Diagnosis not present

## 2016-05-13 NOTE — Progress Notes (Signed)
Patient ID: Ashley [name], female   DOB: 1931/09/12, 82 y.o.   MRN: [id number]  Fracture care follow-up  Chief Complaint  Patient presents with  . Follow-up    Left Proximal humerus fx, DOI 03/02/16    Patient underwent physical therapy comes in for follow-up check her range of motion after proximal humerus fracture treated nonoperatively

## 2016-05-14 DIAGNOSIS — Z7982 Long term (current) use of aspirin: Secondary | ICD-10-CM | POA: Diagnosis not present

## 2016-05-14 DIAGNOSIS — S2242XD Multiple fractures of ribs, left side, subsequent encounter for fracture with routine healing: Secondary | ICD-10-CM | POA: Diagnosis not present

## 2016-05-14 DIAGNOSIS — M810 Age-related osteoporosis without current pathological fracture: Secondary | ICD-10-CM | POA: Diagnosis not present

## 2016-05-14 DIAGNOSIS — I10 Essential (primary) hypertension: Secondary | ICD-10-CM | POA: Diagnosis not present

## 2016-05-14 DIAGNOSIS — K219 Gastro-esophageal reflux disease without esophagitis: Secondary | ICD-10-CM | POA: Diagnosis not present

## 2016-05-14 DIAGNOSIS — Z853 Personal history of malignant neoplasm of breast: Secondary | ICD-10-CM | POA: Diagnosis not present

## 2016-05-14 DIAGNOSIS — D509 Iron deficiency anemia, unspecified: Secondary | ICD-10-CM | POA: Diagnosis not present

## 2016-05-14 DIAGNOSIS — Z9981 Dependence on supplemental oxygen: Secondary | ICD-10-CM | POA: Diagnosis not present

## 2016-05-14 DIAGNOSIS — M6281 Muscle weakness (generalized): Secondary | ICD-10-CM | POA: Diagnosis not present

## 2016-05-14 DIAGNOSIS — Z79891 Long term (current) use of opiate analgesic: Secondary | ICD-10-CM | POA: Diagnosis not present

## 2016-05-14 DIAGNOSIS — S42215D Unspecified nondisplaced fracture of surgical neck of left humerus, subsequent encounter for fracture with routine healing: Secondary | ICD-10-CM | POA: Diagnosis not present

## 2016-05-14 DIAGNOSIS — E039 Hypothyroidism, unspecified: Secondary | ICD-10-CM | POA: Diagnosis not present

## 2016-05-14 DIAGNOSIS — Z9181 History of falling: Secondary | ICD-10-CM | POA: Diagnosis not present

## 2016-05-15 ENCOUNTER — Encounter (HOSPITAL_COMMUNITY): Payer: Medicare Other

## 2016-05-15 ENCOUNTER — Encounter (HOSPITAL_COMMUNITY): Payer: Medicare HMO

## 2016-05-16 DIAGNOSIS — S42215D Unspecified nondisplaced fracture of surgical neck of left humerus, subsequent encounter for fracture with routine healing: Secondary | ICD-10-CM | POA: Diagnosis not present

## 2016-05-16 DIAGNOSIS — M810 Age-related osteoporosis without current pathological fracture: Secondary | ICD-10-CM | POA: Diagnosis not present

## 2016-05-16 DIAGNOSIS — Z853 Personal history of malignant neoplasm of breast: Secondary | ICD-10-CM | POA: Diagnosis not present

## 2016-05-16 DIAGNOSIS — I10 Essential (primary) hypertension: Secondary | ICD-10-CM | POA: Diagnosis not present

## 2016-05-16 DIAGNOSIS — K219 Gastro-esophageal reflux disease without esophagitis: Secondary | ICD-10-CM | POA: Diagnosis not present

## 2016-05-16 DIAGNOSIS — Z7982 Long term (current) use of aspirin: Secondary | ICD-10-CM | POA: Diagnosis not present

## 2016-05-16 DIAGNOSIS — M6281 Muscle weakness (generalized): Secondary | ICD-10-CM | POA: Diagnosis not present

## 2016-05-16 DIAGNOSIS — Z79891 Long term (current) use of opiate analgesic: Secondary | ICD-10-CM | POA: Diagnosis not present

## 2016-05-16 DIAGNOSIS — D509 Iron deficiency anemia, unspecified: Secondary | ICD-10-CM | POA: Diagnosis not present

## 2016-05-16 DIAGNOSIS — Z9181 History of falling: Secondary | ICD-10-CM | POA: Diagnosis not present

## 2016-05-16 DIAGNOSIS — S2242XD Multiple fractures of ribs, left side, subsequent encounter for fracture with routine healing: Secondary | ICD-10-CM | POA: Diagnosis not present

## 2016-05-16 DIAGNOSIS — E039 Hypothyroidism, unspecified: Secondary | ICD-10-CM | POA: Diagnosis not present

## 2016-05-16 DIAGNOSIS — Z9981 Dependence on supplemental oxygen: Secondary | ICD-10-CM | POA: Diagnosis not present

## 2016-05-17 DIAGNOSIS — E039 Hypothyroidism, unspecified: Secondary | ICD-10-CM | POA: Diagnosis not present

## 2016-05-17 DIAGNOSIS — I10 Essential (primary) hypertension: Secondary | ICD-10-CM | POA: Diagnosis not present

## 2016-05-17 DIAGNOSIS — Z853 Personal history of malignant neoplasm of breast: Secondary | ICD-10-CM | POA: Diagnosis not present

## 2016-05-17 DIAGNOSIS — Z9181 History of falling: Secondary | ICD-10-CM | POA: Diagnosis not present

## 2016-05-17 DIAGNOSIS — K219 Gastro-esophageal reflux disease without esophagitis: Secondary | ICD-10-CM | POA: Diagnosis not present

## 2016-05-17 DIAGNOSIS — S2242XD Multiple fractures of ribs, left side, subsequent encounter for fracture with routine healing: Secondary | ICD-10-CM | POA: Diagnosis not present

## 2016-05-17 DIAGNOSIS — M810 Age-related osteoporosis without current pathological fracture: Secondary | ICD-10-CM | POA: Diagnosis not present

## 2016-05-17 DIAGNOSIS — D509 Iron deficiency anemia, unspecified: Secondary | ICD-10-CM | POA: Diagnosis not present

## 2016-05-17 DIAGNOSIS — S42215D Unspecified nondisplaced fracture of surgical neck of left humerus, subsequent encounter for fracture with routine healing: Secondary | ICD-10-CM | POA: Diagnosis not present

## 2016-05-17 DIAGNOSIS — Z79891 Long term (current) use of opiate analgesic: Secondary | ICD-10-CM | POA: Diagnosis not present

## 2016-05-17 DIAGNOSIS — Z7982 Long term (current) use of aspirin: Secondary | ICD-10-CM | POA: Diagnosis not present

## 2016-05-17 DIAGNOSIS — Z9981 Dependence on supplemental oxygen: Secondary | ICD-10-CM | POA: Diagnosis not present

## 2016-05-17 DIAGNOSIS — M6281 Muscle weakness (generalized): Secondary | ICD-10-CM | POA: Diagnosis not present

## 2016-05-21 ENCOUNTER — Ambulatory Visit (HOSPITAL_COMMUNITY)
Admission: RE | Admit: 2016-05-21 | Discharge: 2016-05-21 | Disposition: A | Discharge status: Home / Self Care | Payer: Medicare Other | Source: Ambulatory Visit | Attending: Oncology | Admitting: Oncology

## 2016-05-21 DIAGNOSIS — C50912 Malignant neoplasm of unspecified site of left female breast: Secondary | ICD-10-CM

## 2016-05-21 DIAGNOSIS — R609 Edema, unspecified: Secondary | ICD-10-CM | POA: Insufficient documentation

## 2016-05-21 DIAGNOSIS — R928 Other abnormal and inconclusive findings on diagnostic imaging of breast: Secondary | ICD-10-CM | POA: Diagnosis not present

## 2016-05-22 ENCOUNTER — Telehealth: Payer: Self-pay | Admitting: Orthopedic Surgery

## 2016-05-22 NOTE — Telephone Encounter (Signed)
Voice message received from [employer], occupational therapist from [facility], needs advice/orders. Direct ph# [phone]

## 2016-05-23 NOTE — Telephone Encounter (Signed)
Returned call, no answer, no option to leave voice mail

## 2016-05-24 DIAGNOSIS — Z7982 Long term (current) use of aspirin: Secondary | ICD-10-CM | POA: Diagnosis not present

## 2016-05-24 DIAGNOSIS — Z9181 History of falling: Secondary | ICD-10-CM | POA: Diagnosis not present

## 2016-05-24 DIAGNOSIS — M6281 Muscle weakness (generalized): Secondary | ICD-10-CM | POA: Diagnosis not present

## 2016-05-24 DIAGNOSIS — S2242XD Multiple fractures of ribs, left side, subsequent encounter for fracture with routine healing: Secondary | ICD-10-CM | POA: Diagnosis not present

## 2016-05-24 DIAGNOSIS — M810 Age-related osteoporosis without current pathological fracture: Secondary | ICD-10-CM | POA: Diagnosis not present

## 2016-05-24 DIAGNOSIS — K219 Gastro-esophageal reflux disease without esophagitis: Secondary | ICD-10-CM | POA: Diagnosis not present

## 2016-05-24 DIAGNOSIS — Z9981 Dependence on supplemental oxygen: Secondary | ICD-10-CM | POA: Diagnosis not present

## 2016-05-24 DIAGNOSIS — Z79891 Long term (current) use of opiate analgesic: Secondary | ICD-10-CM | POA: Diagnosis not present

## 2016-05-24 DIAGNOSIS — S42215D Unspecified nondisplaced fracture of surgical neck of left humerus, subsequent encounter for fracture with routine healing: Secondary | ICD-10-CM | POA: Diagnosis not present

## 2016-05-24 DIAGNOSIS — I10 Essential (primary) hypertension: Secondary | ICD-10-CM | POA: Diagnosis not present

## 2016-05-24 DIAGNOSIS — E039 Hypothyroidism, unspecified: Secondary | ICD-10-CM | POA: Diagnosis not present

## 2016-05-24 DIAGNOSIS — D509 Iron deficiency anemia, unspecified: Secondary | ICD-10-CM | POA: Diagnosis not present

## 2016-05-24 DIAGNOSIS — Z853 Personal history of malignant neoplasm of breast: Secondary | ICD-10-CM | POA: Diagnosis not present

## 2016-05-28 DIAGNOSIS — Z9181 History of falling: Secondary | ICD-10-CM | POA: Diagnosis not present

## 2016-05-28 DIAGNOSIS — Z7982 Long term (current) use of aspirin: Secondary | ICD-10-CM | POA: Diagnosis not present

## 2016-05-28 DIAGNOSIS — Z853 Personal history of malignant neoplasm of breast: Secondary | ICD-10-CM | POA: Diagnosis not present

## 2016-05-28 DIAGNOSIS — M810 Age-related osteoporosis without current pathological fracture: Secondary | ICD-10-CM | POA: Diagnosis not present

## 2016-05-28 DIAGNOSIS — K219 Gastro-esophageal reflux disease without esophagitis: Secondary | ICD-10-CM | POA: Diagnosis not present

## 2016-05-28 DIAGNOSIS — M6281 Muscle weakness (generalized): Secondary | ICD-10-CM | POA: Diagnosis not present

## 2016-05-28 DIAGNOSIS — Z9981 Dependence on supplemental oxygen: Secondary | ICD-10-CM | POA: Diagnosis not present

## 2016-05-28 DIAGNOSIS — D509 Iron deficiency anemia, unspecified: Secondary | ICD-10-CM | POA: Diagnosis not present

## 2016-05-28 DIAGNOSIS — Z79891 Long term (current) use of opiate analgesic: Secondary | ICD-10-CM | POA: Diagnosis not present

## 2016-05-28 DIAGNOSIS — S2242XD Multiple fractures of ribs, left side, subsequent encounter for fracture with routine healing: Secondary | ICD-10-CM | POA: Diagnosis not present

## 2016-05-28 DIAGNOSIS — I10 Essential (primary) hypertension: Secondary | ICD-10-CM | POA: Diagnosis not present

## 2016-05-28 DIAGNOSIS — S42215D Unspecified nondisplaced fracture of surgical neck of left humerus, subsequent encounter for fracture with routine healing: Secondary | ICD-10-CM | POA: Diagnosis not present

## 2016-05-28 DIAGNOSIS — E039 Hypothyroidism, unspecified: Secondary | ICD-10-CM | POA: Diagnosis not present

## 2016-05-28 NOTE — Telephone Encounter (Signed)
ROUTING TO DR HARRISON TO ADVISE 

## 2016-05-28 NOTE — Telephone Encounter (Signed)
THERAPIST AWARE 

## 2016-05-28 NOTE — Telephone Encounter (Signed)
Call back received from occupational therapist Mallorie, [facility] - her question is does the patient have any left upper extremity precautions?  She is to see patient today, 05/28/16. (direct# [phone])

## 2016-05-28 NOTE — Telephone Encounter (Signed)
no

## 2016-05-29 DIAGNOSIS — Z1389 Encounter for screening for other disorder: Secondary | ICD-10-CM | POA: Diagnosis not present

## 2016-05-29 DIAGNOSIS — I10 Essential (primary) hypertension: Secondary | ICD-10-CM | POA: Diagnosis not present

## 2016-05-29 DIAGNOSIS — M1991 Primary osteoarthritis, unspecified site: Secondary | ICD-10-CM | POA: Diagnosis not present

## 2016-05-29 DIAGNOSIS — K219 Gastro-esophageal reflux disease without esophagitis: Secondary | ICD-10-CM | POA: Diagnosis not present

## 2016-05-29 DIAGNOSIS — R0600 Dyspnea, unspecified: Secondary | ICD-10-CM | POA: Diagnosis not present

## 2016-05-29 DIAGNOSIS — Z6822 Body mass index (BMI) 22.0-22.9, adult: Secondary | ICD-10-CM | POA: Diagnosis not present

## 2016-05-29 DIAGNOSIS — C50919 Malignant neoplasm of unspecified site of unspecified female breast: Secondary | ICD-10-CM | POA: Diagnosis not present

## 2016-05-31 DIAGNOSIS — E039 Hypothyroidism, unspecified: Secondary | ICD-10-CM | POA: Diagnosis not present

## 2016-05-31 DIAGNOSIS — M810 Age-related osteoporosis without current pathological fracture: Secondary | ICD-10-CM | POA: Diagnosis not present

## 2016-05-31 DIAGNOSIS — Z9181 History of falling: Secondary | ICD-10-CM | POA: Diagnosis not present

## 2016-05-31 DIAGNOSIS — M6281 Muscle weakness (generalized): Secondary | ICD-10-CM | POA: Diagnosis not present

## 2016-05-31 DIAGNOSIS — D509 Iron deficiency anemia, unspecified: Secondary | ICD-10-CM | POA: Diagnosis not present

## 2016-05-31 DIAGNOSIS — Z9981 Dependence on supplemental oxygen: Secondary | ICD-10-CM | POA: Diagnosis not present

## 2016-05-31 DIAGNOSIS — S2242XD Multiple fractures of ribs, left side, subsequent encounter for fracture with routine healing: Secondary | ICD-10-CM | POA: Diagnosis not present

## 2016-05-31 DIAGNOSIS — Z79891 Long term (current) use of opiate analgesic: Secondary | ICD-10-CM | POA: Diagnosis not present

## 2016-05-31 DIAGNOSIS — Z7982 Long term (current) use of aspirin: Secondary | ICD-10-CM | POA: Diagnosis not present

## 2016-05-31 DIAGNOSIS — S42215D Unspecified nondisplaced fracture of surgical neck of left humerus, subsequent encounter for fracture with routine healing: Secondary | ICD-10-CM | POA: Diagnosis not present

## 2016-05-31 DIAGNOSIS — I10 Essential (primary) hypertension: Secondary | ICD-10-CM | POA: Diagnosis not present

## 2016-05-31 DIAGNOSIS — K219 Gastro-esophageal reflux disease without esophagitis: Secondary | ICD-10-CM | POA: Diagnosis not present

## 2016-05-31 DIAGNOSIS — Z853 Personal history of malignant neoplasm of breast: Secondary | ICD-10-CM | POA: Diagnosis not present

## 2016-06-03 DIAGNOSIS — M810 Age-related osteoporosis without current pathological fracture: Secondary | ICD-10-CM | POA: Diagnosis not present

## 2016-06-03 DIAGNOSIS — Z7982 Long term (current) use of aspirin: Secondary | ICD-10-CM | POA: Diagnosis not present

## 2016-06-03 DIAGNOSIS — D509 Iron deficiency anemia, unspecified: Secondary | ICD-10-CM | POA: Diagnosis not present

## 2016-06-03 DIAGNOSIS — S42215D Unspecified nondisplaced fracture of surgical neck of left humerus, subsequent encounter for fracture with routine healing: Secondary | ICD-10-CM | POA: Diagnosis not present

## 2016-06-03 DIAGNOSIS — Z9181 History of falling: Secondary | ICD-10-CM | POA: Diagnosis not present

## 2016-06-03 DIAGNOSIS — Z853 Personal history of malignant neoplasm of breast: Secondary | ICD-10-CM | POA: Diagnosis not present

## 2016-06-03 DIAGNOSIS — E039 Hypothyroidism, unspecified: Secondary | ICD-10-CM | POA: Diagnosis not present

## 2016-06-03 DIAGNOSIS — K219 Gastro-esophageal reflux disease without esophagitis: Secondary | ICD-10-CM | POA: Diagnosis not present

## 2016-06-03 DIAGNOSIS — Z9981 Dependence on supplemental oxygen: Secondary | ICD-10-CM | POA: Diagnosis not present

## 2016-06-03 DIAGNOSIS — M6281 Muscle weakness (generalized): Secondary | ICD-10-CM | POA: Diagnosis not present

## 2016-06-03 DIAGNOSIS — I10 Essential (primary) hypertension: Secondary | ICD-10-CM | POA: Diagnosis not present

## 2016-06-03 DIAGNOSIS — S2242XD Multiple fractures of ribs, left side, subsequent encounter for fracture with routine healing: Secondary | ICD-10-CM | POA: Diagnosis not present

## 2016-06-03 DIAGNOSIS — Z79891 Long term (current) use of opiate analgesic: Secondary | ICD-10-CM | POA: Diagnosis not present

## 2016-06-10 DIAGNOSIS — I509 Heart failure, unspecified: Secondary | ICD-10-CM | POA: Diagnosis not present

## 2016-06-10 DIAGNOSIS — Z1389 Encounter for screening for other disorder: Secondary | ICD-10-CM | POA: Diagnosis not present

## 2016-06-14 DIAGNOSIS — K219 Gastro-esophageal reflux disease without esophagitis: Secondary | ICD-10-CM | POA: Diagnosis not present

## 2016-06-14 DIAGNOSIS — M6281 Muscle weakness (generalized): Secondary | ICD-10-CM | POA: Diagnosis not present

## 2016-06-14 DIAGNOSIS — Z7982 Long term (current) use of aspirin: Secondary | ICD-10-CM | POA: Diagnosis not present

## 2016-06-14 DIAGNOSIS — S2242XD Multiple fractures of ribs, left side, subsequent encounter for fracture with routine healing: Secondary | ICD-10-CM | POA: Diagnosis not present

## 2016-06-14 DIAGNOSIS — I10 Essential (primary) hypertension: Secondary | ICD-10-CM | POA: Diagnosis not present

## 2016-06-14 DIAGNOSIS — S42215D Unspecified nondisplaced fracture of surgical neck of left humerus, subsequent encounter for fracture with routine healing: Secondary | ICD-10-CM | POA: Diagnosis not present

## 2016-06-14 DIAGNOSIS — D509 Iron deficiency anemia, unspecified: Secondary | ICD-10-CM | POA: Diagnosis not present

## 2016-06-14 DIAGNOSIS — Z9181 History of falling: Secondary | ICD-10-CM | POA: Diagnosis not present

## 2016-06-14 DIAGNOSIS — Z9981 Dependence on supplemental oxygen: Secondary | ICD-10-CM | POA: Diagnosis not present

## 2016-06-14 DIAGNOSIS — Z853 Personal history of malignant neoplasm of breast: Secondary | ICD-10-CM | POA: Diagnosis not present

## 2016-06-14 DIAGNOSIS — M810 Age-related osteoporosis without current pathological fracture: Secondary | ICD-10-CM | POA: Diagnosis not present

## 2016-06-14 DIAGNOSIS — Z79891 Long term (current) use of opiate analgesic: Secondary | ICD-10-CM | POA: Diagnosis not present

## 2016-06-14 DIAGNOSIS — E039 Hypothyroidism, unspecified: Secondary | ICD-10-CM | POA: Diagnosis not present

## 2016-06-21 DIAGNOSIS — R062 Wheezing: Secondary | ICD-10-CM | POA: Diagnosis not present

## 2016-06-21 DIAGNOSIS — R0902 Hypoxemia: Secondary | ICD-10-CM | POA: Diagnosis not present

## 2016-06-21 DIAGNOSIS — J9610 Chronic respiratory failure, unspecified whether with hypoxia or hypercapnia: Secondary | ICD-10-CM | POA: Diagnosis not present

## 2016-06-21 DIAGNOSIS — R0600 Dyspnea, unspecified: Secondary | ICD-10-CM | POA: Diagnosis not present

## 2016-06-21 DIAGNOSIS — R0602 Shortness of breath: Secondary | ICD-10-CM | POA: Diagnosis not present

## 2016-06-25 DIAGNOSIS — R0902 Hypoxemia: Secondary | ICD-10-CM | POA: Diagnosis not present

## 2016-07-13 DIAGNOSIS — Z853 Personal history of malignant neoplasm of breast: Secondary | ICD-10-CM | POA: Diagnosis not present

## 2016-07-13 DIAGNOSIS — G4733 Obstructive sleep apnea (adult) (pediatric): Secondary | ICD-10-CM | POA: Diagnosis not present

## 2016-07-13 DIAGNOSIS — Z9981 Dependence on supplemental oxygen: Secondary | ICD-10-CM | POA: Diagnosis not present

## 2016-07-13 DIAGNOSIS — Z8781 Personal history of (healed) traumatic fracture: Secondary | ICD-10-CM | POA: Diagnosis not present

## 2016-07-13 DIAGNOSIS — Z79891 Long term (current) use of opiate analgesic: Secondary | ICD-10-CM | POA: Diagnosis not present

## 2016-07-13 DIAGNOSIS — I10 Essential (primary) hypertension: Secondary | ICD-10-CM | POA: Diagnosis not present

## 2016-07-13 DIAGNOSIS — M419 Scoliosis, unspecified: Secondary | ICD-10-CM | POA: Diagnosis not present

## 2016-07-13 DIAGNOSIS — R0902 Hypoxemia: Secondary | ICD-10-CM | POA: Diagnosis not present

## 2016-07-13 DIAGNOSIS — M1991 Primary osteoarthritis, unspecified site: Secondary | ICD-10-CM | POA: Diagnosis not present

## 2016-07-17 ENCOUNTER — Other Ambulatory Visit (HOSPITAL_COMMUNITY): Payer: Medicare HMO

## 2016-07-17 ENCOUNTER — Ambulatory Visit (HOSPITAL_COMMUNITY): Payer: Medicare HMO | Admitting: Oncology

## 2016-07-17 DIAGNOSIS — I10 Essential (primary) hypertension: Secondary | ICD-10-CM | POA: Diagnosis not present

## 2016-07-17 DIAGNOSIS — Z9981 Dependence on supplemental oxygen: Secondary | ICD-10-CM | POA: Diagnosis not present

## 2016-07-17 DIAGNOSIS — R0902 Hypoxemia: Secondary | ICD-10-CM | POA: Diagnosis not present

## 2016-07-17 DIAGNOSIS — Z853 Personal history of malignant neoplasm of breast: Secondary | ICD-10-CM | POA: Diagnosis not present

## 2016-07-17 DIAGNOSIS — M419 Scoliosis, unspecified: Secondary | ICD-10-CM | POA: Diagnosis not present

## 2016-07-17 DIAGNOSIS — G4733 Obstructive sleep apnea (adult) (pediatric): Secondary | ICD-10-CM | POA: Diagnosis not present

## 2016-07-17 DIAGNOSIS — Z79891 Long term (current) use of opiate analgesic: Secondary | ICD-10-CM | POA: Diagnosis not present

## 2016-07-17 DIAGNOSIS — Z8781 Personal history of (healed) traumatic fracture: Secondary | ICD-10-CM | POA: Diagnosis not present

## 2016-07-17 DIAGNOSIS — M1991 Primary osteoarthritis, unspecified site: Secondary | ICD-10-CM | POA: Diagnosis not present

## 2016-07-19 ENCOUNTER — Ambulatory Visit (HOSPITAL_COMMUNITY): Payer: Medicare HMO | Admitting: Oncology

## 2016-07-19 DIAGNOSIS — Z8781 Personal history of (healed) traumatic fracture: Secondary | ICD-10-CM | POA: Diagnosis not present

## 2016-07-19 DIAGNOSIS — M1991 Primary osteoarthritis, unspecified site: Secondary | ICD-10-CM | POA: Diagnosis not present

## 2016-07-19 DIAGNOSIS — G4733 Obstructive sleep apnea (adult) (pediatric): Secondary | ICD-10-CM | POA: Diagnosis not present

## 2016-07-19 DIAGNOSIS — R0902 Hypoxemia: Secondary | ICD-10-CM | POA: Diagnosis not present

## 2016-07-19 DIAGNOSIS — Z9981 Dependence on supplemental oxygen: Secondary | ICD-10-CM | POA: Diagnosis not present

## 2016-07-19 DIAGNOSIS — Z79891 Long term (current) use of opiate analgesic: Secondary | ICD-10-CM | POA: Diagnosis not present

## 2016-07-19 DIAGNOSIS — Z853 Personal history of malignant neoplasm of breast: Secondary | ICD-10-CM | POA: Diagnosis not present

## 2016-07-19 DIAGNOSIS — I10 Essential (primary) hypertension: Secondary | ICD-10-CM | POA: Diagnosis not present

## 2016-07-19 DIAGNOSIS — M419 Scoliosis, unspecified: Secondary | ICD-10-CM | POA: Diagnosis not present

## 2016-07-22 DIAGNOSIS — R0902 Hypoxemia: Secondary | ICD-10-CM | POA: Diagnosis not present

## 2016-07-22 DIAGNOSIS — R0602 Shortness of breath: Secondary | ICD-10-CM | POA: Diagnosis not present

## 2016-07-22 DIAGNOSIS — J9610 Chronic respiratory failure, unspecified whether with hypoxia or hypercapnia: Secondary | ICD-10-CM | POA: Diagnosis not present

## 2016-07-22 DIAGNOSIS — Z853 Personal history of malignant neoplasm of breast: Secondary | ICD-10-CM | POA: Diagnosis not present

## 2016-07-22 DIAGNOSIS — Z9981 Dependence on supplemental oxygen: Secondary | ICD-10-CM | POA: Diagnosis not present

## 2016-07-22 DIAGNOSIS — Z79891 Long term (current) use of opiate analgesic: Secondary | ICD-10-CM | POA: Diagnosis not present

## 2016-07-22 DIAGNOSIS — I10 Essential (primary) hypertension: Secondary | ICD-10-CM | POA: Diagnosis not present

## 2016-07-22 DIAGNOSIS — M419 Scoliosis, unspecified: Secondary | ICD-10-CM | POA: Diagnosis not present

## 2016-07-22 DIAGNOSIS — R062 Wheezing: Secondary | ICD-10-CM | POA: Diagnosis not present

## 2016-07-22 DIAGNOSIS — M1991 Primary osteoarthritis, unspecified site: Secondary | ICD-10-CM | POA: Diagnosis not present

## 2016-07-22 DIAGNOSIS — G4733 Obstructive sleep apnea (adult) (pediatric): Secondary | ICD-10-CM | POA: Diagnosis not present

## 2016-07-22 DIAGNOSIS — Z8781 Personal history of (healed) traumatic fracture: Secondary | ICD-10-CM | POA: Diagnosis not present

## 2016-07-24 DIAGNOSIS — M1991 Primary osteoarthritis, unspecified site: Secondary | ICD-10-CM | POA: Diagnosis not present

## 2016-07-24 DIAGNOSIS — Z79891 Long term (current) use of opiate analgesic: Secondary | ICD-10-CM | POA: Diagnosis not present

## 2016-07-24 DIAGNOSIS — Z853 Personal history of malignant neoplasm of breast: Secondary | ICD-10-CM | POA: Diagnosis not present

## 2016-07-24 DIAGNOSIS — R0902 Hypoxemia: Secondary | ICD-10-CM | POA: Diagnosis not present

## 2016-07-24 DIAGNOSIS — Z8781 Personal history of (healed) traumatic fracture: Secondary | ICD-10-CM | POA: Diagnosis not present

## 2016-07-24 DIAGNOSIS — Z9981 Dependence on supplemental oxygen: Secondary | ICD-10-CM | POA: Diagnosis not present

## 2016-07-24 DIAGNOSIS — M419 Scoliosis, unspecified: Secondary | ICD-10-CM | POA: Diagnosis not present

## 2016-07-24 DIAGNOSIS — I10 Essential (primary) hypertension: Secondary | ICD-10-CM | POA: Diagnosis not present

## 2016-07-24 DIAGNOSIS — G4733 Obstructive sleep apnea (adult) (pediatric): Secondary | ICD-10-CM | POA: Diagnosis not present

## 2016-07-30 DIAGNOSIS — Z853 Personal history of malignant neoplasm of breast: Secondary | ICD-10-CM | POA: Diagnosis not present

## 2016-07-30 DIAGNOSIS — Z9981 Dependence on supplemental oxygen: Secondary | ICD-10-CM | POA: Diagnosis not present

## 2016-07-30 DIAGNOSIS — M419 Scoliosis, unspecified: Secondary | ICD-10-CM | POA: Diagnosis not present

## 2016-07-30 DIAGNOSIS — Z79891 Long term (current) use of opiate analgesic: Secondary | ICD-10-CM | POA: Diagnosis not present

## 2016-07-30 DIAGNOSIS — I10 Essential (primary) hypertension: Secondary | ICD-10-CM | POA: Diagnosis not present

## 2016-07-30 DIAGNOSIS — G4733 Obstructive sleep apnea (adult) (pediatric): Secondary | ICD-10-CM | POA: Diagnosis not present

## 2016-07-30 DIAGNOSIS — Z8781 Personal history of (healed) traumatic fracture: Secondary | ICD-10-CM | POA: Diagnosis not present

## 2016-07-30 DIAGNOSIS — R0902 Hypoxemia: Secondary | ICD-10-CM | POA: Diagnosis not present

## 2016-07-30 DIAGNOSIS — M1991 Primary osteoarthritis, unspecified site: Secondary | ICD-10-CM | POA: Diagnosis not present

## 2016-08-01 DIAGNOSIS — R0902 Hypoxemia: Secondary | ICD-10-CM | POA: Diagnosis not present

## 2016-08-01 DIAGNOSIS — M419 Scoliosis, unspecified: Secondary | ICD-10-CM | POA: Diagnosis not present

## 2016-08-01 DIAGNOSIS — Z8781 Personal history of (healed) traumatic fracture: Secondary | ICD-10-CM | POA: Diagnosis not present

## 2016-08-01 DIAGNOSIS — I10 Essential (primary) hypertension: Secondary | ICD-10-CM | POA: Diagnosis not present

## 2016-08-01 DIAGNOSIS — Z9981 Dependence on supplemental oxygen: Secondary | ICD-10-CM | POA: Diagnosis not present

## 2016-08-01 DIAGNOSIS — M1991 Primary osteoarthritis, unspecified site: Secondary | ICD-10-CM | POA: Diagnosis not present

## 2016-08-01 DIAGNOSIS — Z79891 Long term (current) use of opiate analgesic: Secondary | ICD-10-CM | POA: Diagnosis not present

## 2016-08-01 DIAGNOSIS — Z853 Personal history of malignant neoplasm of breast: Secondary | ICD-10-CM | POA: Diagnosis not present

## 2016-08-01 DIAGNOSIS — G4733 Obstructive sleep apnea (adult) (pediatric): Secondary | ICD-10-CM | POA: Diagnosis not present

## 2016-08-07 DIAGNOSIS — Z853 Personal history of malignant neoplasm of breast: Secondary | ICD-10-CM | POA: Diagnosis not present

## 2016-08-07 DIAGNOSIS — M419 Scoliosis, unspecified: Secondary | ICD-10-CM | POA: Diagnosis not present

## 2016-08-07 DIAGNOSIS — Z8781 Personal history of (healed) traumatic fracture: Secondary | ICD-10-CM | POA: Diagnosis not present

## 2016-08-07 DIAGNOSIS — I10 Essential (primary) hypertension: Secondary | ICD-10-CM | POA: Diagnosis not present

## 2016-08-07 DIAGNOSIS — R0902 Hypoxemia: Secondary | ICD-10-CM | POA: Diagnosis not present

## 2016-08-07 DIAGNOSIS — Z9981 Dependence on supplemental oxygen: Secondary | ICD-10-CM | POA: Diagnosis not present

## 2016-08-07 DIAGNOSIS — Z79891 Long term (current) use of opiate analgesic: Secondary | ICD-10-CM | POA: Diagnosis not present

## 2016-08-07 DIAGNOSIS — G4733 Obstructive sleep apnea (adult) (pediatric): Secondary | ICD-10-CM | POA: Diagnosis not present

## 2016-08-07 DIAGNOSIS — M1991 Primary osteoarthritis, unspecified site: Secondary | ICD-10-CM | POA: Diagnosis not present

## 2016-08-08 ENCOUNTER — Encounter (HOSPITAL_COMMUNITY): Payer: Self-pay | Admitting: Oncology

## 2016-08-08 NOTE — Assessment & Plan Note (Addendum)
Significant anemia and thrombocytopenia in October 2017 during hospitalization with peripheral anemia work-up demonstrating iron deficiency with low serum iron and low saturation and low B12.  Hospital admission chart is reviewed.  Labs today: CBC diff, CMET, LDH, ESR, CRP, pathology smear review, anemia panel, methylmalonic acid, homocysteine level, intrinsic factor antibody, and anti-parietal cell antibody, EPO level, haptoglobin, retic count, SPEP + IFE, light chain assay, IgG, IgA, IgM.  Start B12 injections weekly x 4, then monthly.  If iron levels are similar as previously documented, we will recall the patient for IV iron therapy.  Labs in 4 weeks: CBC diff, BMET, iron/TIBC, ferritin, B12.

## 2016-08-08 NOTE — Assessment & Plan Note (Signed)
Stage IB, grade 1, ER +99%, PR +100%, HER-2/neu negative, left-sided breast cancer 8 mm in size status post lumpectomy and sentinel lymph node biopsy which was negative. Ki-67 marker was low 6% with surgery on 04/24/2009, followed by Arimidex therapy which is completed.  Labs today: CBC diff, CMET, ferritin.  I personally reviewed and went over laboratory results with the patient.  The results are noted within this dictation.    I personally reviewed and went over radiographic studies with the patient.  The results are noted within this dictation.  Mammogram in Dec 2017 was BIRADS 2.  She will be due for her next mammogram in Dec 2018.  Order is placed.  Labs in 6 months: CBC diff, CMET

## 2016-08-08 NOTE — Assessment & Plan Note (Signed)
Osteoporosis with severe kyphosis.  She is on Ca++ and Vit D. Managed by primary care provider.

## 2016-08-08 NOTE — Progress Notes (Signed)
Ashley [name]., MD [place] [address]  Malignant neoplasm of left breast in female, estrogen receptor positive, unspecified site of breast ([place]) - Plan: MM SCREENING BREAST TOMO BILATERAL, CBC with Differential, Comprehensive metabolic panel  Age-related osteoporosis without current pathological fracture  Other pancytopenia ([facility]) - Plan: Lactate dehydrogenase, Sedimentation rate, Pathologist smear review, Vitamin B12, Folate, Iron and TIBC, Ferritin, Erythropoietin, Haptoglobin, Reticulocytes, Kappa/lambda light chains, IgG, IgA, IgM, Immunofixation electrophoresis, Protein electrophoresis, serum, C-reactive protein, Intrinsic Factor Antibodies, Anti-parietal antibody, Methylmalonic acid, serum, Homocysteine, serum, CBC with Differential, Basic metabolic panel, Iron and TIBC, Ferritin, Vitamin B12  CURRENT THERAPY: Surveillance per NCCN guidelines.  Starting B12 IM replacement therapy today.  INTERVAL HISTORY: TERRILYN TYNER 83 y.o. female returns for followup of Stage IB, grade 1, ER +99%, PR +100%, HER-2/neu negative, left-sided breast cancer 8 mm in size status post lumpectomy and sentinel lymph node biopsy which was negative. Ki-67 marker was low 6% with surgery on 04/24/2009, followed by Arimidex therapy which is completed. AND Osteoporosis, on Ca++ and Vit D. AND Significant anemia and thrombocytopenia during recent hospitalization.  From an oncology standpoint, the patient denies any complaints.  Her biggest issue continues to be her arthritis.  She reports this is being managed by primary care physician.  Recent hospitalization is noted from October.  In October 2017, she fell and fractured her left humerus and multiple left ribs.  She was subsequently admitted to the skilled nursing facility for PT rehabilitation.  She is now back at home.  Today, it is difficult to keep her on topic.  She is educated about her B12 deficiency in October.   She denies being on any B12 replacement therapy.  Review of Systems  Constitutional: Negative.  Negative for chills, fever and weight loss.  HENT: Negative.   Eyes: Negative.   Respiratory: Negative.  Negative for cough.   Cardiovascular: Negative.  Negative for chest pain.  Gastrointestinal: Negative.  Negative for blood in stool, constipation, diarrhea, melena, nausea and vomiting.  Genitourinary: Negative.   Musculoskeletal: Positive for back pain (chronic) and joint pain (chronic).  Skin: Negative.   Neurological: Negative.  Negative for weakness.  Endo/Heme/Allergies: Negative.   Psychiatric/Behavioral: Negative.     Past Medical History:  Diagnosis Date  . Acid reflux   . Arthritis   . At risk for falls   . Back pain   . Bilateral ovarian cysts 02/20/2012  . Cancer ([place])   . Edema   . Hypertension   . Left-sided breast cancer 11/14/2011   Started Arimidex on 07/25/2009. Stage IB, grade 1, ER 99%, PR 100%, Her2 negative, left-sided breast cancer, 8 mm in size, S/P lumpectomy on 04/24/2010, and sentinel node biopsy.  Ki-67 marker 6%.  . Osteoporosis 03/06/2012   Switched from Arimidex to Tamoxifen as a result of osteoporosis   . Other pancytopenia ([place]) 05/29/2014  . Thyroid disease     Past Surgical History:  Procedure Laterality Date  . APPENDECTOMY    . BREAST SURGERY    . CHOLECYSTECTOMY    . HERNIA REPAIR    . HIP FRACTURE SURGERY    . TONSILLECTOMY    . TOTAL KNEE REVISION      History reviewed. No pertinent family history.  Social History   Social History  . Marital status: Divorced    Spouse name: N/A  . Number of children: N/A  . Years of education: N/A   Social History Main Topics  .  Smoking status: Never Smoker  . Smokeless tobacco: Never Used  . Alcohol use No  . Drug use: No  . Sexual activity: Not Asked   Other Topics Concern  . None   Social History Narrative  . None     PHYSICAL EXAMINATION  ECOG PERFORMANCE STATUS: 2 -  Symptomatic, <50% confined to bed  Vitals:   08/09/16 1123  BP: (!) 87/40  Pulse: (!) 57  Resp: 16  Temp: 97.5 F (36.4 C)    GENERAL:alert, no distress, comfortable, cooperative, smiling and in wheelchair, unaccompanied. SKIN: skin color, texture, turgor are normal, no rashes or significant lesions HEAD: Normocephalic, No masses, lesions, tenderness or abnormalities EYES: normal, EOMI EARS: External ears normal OROPHARYNX:lips, buccal mucosa, and tongue normal and mucous membranes are moist  NECK: supple, trachea midline LYMPH:  no palpable lymphadenopathy BREAST:patient declines to have breast exam LUNGS: clear to auscultation and percussion HEART: regular rate & rhythm ABDOMEN:abdomen soft and normal bowel sounds BACK: dorsal kyphosis of marked degree EXTREMITIES:less then 2 second capillary refill, no joint deformities, effusion, or inflammation, no skin discoloration, no cyanosis, compression stockings are in place with tegaderms on lower extremities.  NEURO: alert & oriented x 3 with fluent speech, no focal motor/sensory deficits, in wheelchair   LABORATORY DATA: CBC    Component Value Date/Time   WBC 3.9 (L) 08/09/2016 1030   RBC 3.91 08/09/2016 1030   RBC 3.91 08/09/2016 1030   HGB 11.4 (L) 08/09/2016 1030   HCT 36.1 08/09/2016 1030   HCT 31.0 (L) 03/03/2016 0627   PLT 118 (L) 08/09/2016 1030   MCV 92.3 08/09/2016 1030   MCH 29.2 08/09/2016 1030   MCHC 31.6 08/09/2016 1030   RDW 15.0 08/09/2016 1030   LYMPHSABS 1.1 08/09/2016 1030   MONOABS 0.3 08/09/2016 1030   EOSABS 0.2 08/09/2016 1030   BASOSABS 0.0 08/09/2016 1030      Chemistry      Component Value Date/Time   NA 139 08/09/2016 1030   K 4.0 08/09/2016 1030   CL 102 08/09/2016 1030   CO2 33 (H) 08/09/2016 1030   BUN 22 (H) 08/09/2016 1030   CREATININE 0.94 08/09/2016 1030      Component Value Date/Time   CALCIUM 9.4 08/09/2016 1030   ALKPHOS 58 08/09/2016 1030   AST 17 08/09/2016 1030   ALT  10 (L) 08/09/2016 1030   BILITOT 0.9 08/09/2016 1030        PENDING LABS:   RADIOGRAPHIC STUDIES:  No results found.   PATHOLOGY:    ASSESSMENT AND PLAN:  Left-sided breast cancer Stage IB, grade 1, ER +99%, PR +100%, HER-2/neu negative, left-sided breast cancer 8 mm in size status post lumpectomy and sentinel lymph node biopsy which was negative. Ki-67 marker was low 6% with surgery on 04/24/2009, followed by Arimidex therapy which is completed.  Labs today: CBC diff, CMET, ferritin.  I personally reviewed and went over laboratory results with the patient.  The results are noted within this dictation.    I personally reviewed and went over radiographic studies with the patient.  The results are noted within this dictation.  Mammogram in Dec 2017 was BIRADS 2.  She will be due for her next mammogram in Dec 2018.  Order is placed.  Labs in 6 months: CBC diff, CMET  Osteoporosis Osteoporosis with severe kyphosis.  She is on Ca++ and Vit D. Managed by primary care provider.  Other pancytopenia ([place]) Significant anemia and thrombocytopenia in October 2017 during hospitalization  with peripheral anemia work-up demonstrating iron deficiency with low serum iron and low saturation and low B12.  Hospital admission chart is reviewed.  Labs today: CBC diff, CMET, LDH, ESR, CRP, pathology smear review, anemia panel, methylmalonic acid, homocysteine level, intrinsic factor antibody, and anti-parietal cell antibody, EPO level, haptoglobin, retic count, SPEP + IFE, light chain assay, IgG, IgA, IgM.  Start B12 injections weekly x 4, then monthly.  If iron levels are similar as previously documented, we will recall the patient for IV iron therapy.  Labs in 4 weeks: CBC diff, BMET, iron/TIBC, ferritin, B12.   ORDERS PLACED FOR THIS ENCOUNTER: Orders Placed This Encounter  Procedures  . MM SCREENING BREAST TOMO BILATERAL  . Lactate dehydrogenase  . Sedimentation rate  . Pathologist  smear review  . Vitamin B12  . Folate  . Iron and TIBC  . Ferritin  . Erythropoietin  . Haptoglobin  . Reticulocytes  . Kappa/lambda light chains  . IgG, IgA, IgM  . Immunofixation electrophoresis  . Protein electrophoresis, serum  . C-reactive protein  . Intrinsic Factor Antibodies  . Anti-parietal antibody  . Methylmalonic acid, serum  . Homocysteine, serum  . CBC with Differential  . Basic metabolic panel  . Iron and TIBC  . Ferritin  . Vitamin B12  . CBC with Differential  . Comprehensive metabolic panel    MEDICATIONS PRESCRIBED THIS ENCOUNTER: Meds ordered this encounter  Medications  . Cholecalciferol 2000 units TABS    Sig: Take 1 tablet by mouth daily.  Cathie Olden Sulfadiazine (SILVADENE EX)    Sig: Apply topically 3 times/day as needed-between meals & bedtime.    THERAPY PLAN:  NCCN guidelines recommends the following surveillance for invasive breast cancer (2.2017):  A. History and Physical exam 1-4 times per year as clinically appropriate for 5 years, then annually.  B. Periodic screening for changes in family history and referral to genetics counseling as indicated  C. Educate, monitor, and refer to lymphedema management.  D. Mammography every 12 months  E. Routine imaging of reconstructed breast is not indicated.  F. In the absence of clinical signs and symptoms suggestive of recurrent disease, there is no indication for laboratory or imaging studies for metastases screening.  G. Women on Tamoxifen: annual gynecologic assessment every 12 months if uterus is present.  H. Women on aromatase inhibitor or who experience ovarian failure secondary to treatment should have monitoring of bone health with a bone mineral density determination at baseline and periodically thereafter.  I. Assess and encourage adherence to adjuvant endocrine therapy.  J. Evidence suggests that active lifestyle, healthy diet, limited alcohol intake, and achieving and maintaining an ideal  body weight (20-25 BMI) may lead to optimal breast cancer outcomes.   All questions were answered. The patient knows to call the clinic with any problems, questions or concerns. We can certainly see the patient much sooner if necessary.  Patient and plan discussed with Dr. Twana First and she is in agreement with the aforementioned.   This note is electronically signed by: Doy Mince 08/09/2016 11:57 AM

## 2016-08-09 ENCOUNTER — Encounter (HOSPITAL_COMMUNITY): Payer: Self-pay | Admitting: Oncology

## 2016-08-09 ENCOUNTER — Encounter (HOSPITAL_BASED_OUTPATIENT_CLINIC_OR_DEPARTMENT_OTHER): Payer: Medicare Other | Admitting: Oncology

## 2016-08-09 ENCOUNTER — Encounter (HOSPITAL_COMMUNITY): Payer: Medicare Other | Attending: Oncology

## 2016-08-09 VITALS — BP 87/40 | HR 57 | Temp 97.5°F | Resp 16 | Wt 105.0 lb

## 2016-08-09 DIAGNOSIS — C50912 Malignant neoplasm of unspecified site of left female breast: Secondary | ICD-10-CM | POA: Diagnosis not present

## 2016-08-09 DIAGNOSIS — D696 Thrombocytopenia, unspecified: Secondary | ICD-10-CM | POA: Diagnosis not present

## 2016-08-09 DIAGNOSIS — Z9889 Other specified postprocedural states: Secondary | ICD-10-CM | POA: Insufficient documentation

## 2016-08-09 DIAGNOSIS — Z170 Estrogen receptor positive status [ER+]: Secondary | ICD-10-CM | POA: Diagnosis not present

## 2016-08-09 DIAGNOSIS — Z853 Personal history of malignant neoplasm of breast: Secondary | ICD-10-CM

## 2016-08-09 DIAGNOSIS — Z9049 Acquired absence of other specified parts of digestive tract: Secondary | ICD-10-CM | POA: Insufficient documentation

## 2016-08-09 DIAGNOSIS — D61818 Other pancytopenia: Secondary | ICD-10-CM | POA: Insufficient documentation

## 2016-08-09 DIAGNOSIS — E538 Deficiency of other specified B group vitamins: Secondary | ICD-10-CM | POA: Diagnosis not present

## 2016-08-09 DIAGNOSIS — E079 Disorder of thyroid, unspecified: Secondary | ICD-10-CM | POA: Insufficient documentation

## 2016-08-09 DIAGNOSIS — M810 Age-related osteoporosis without current pathological fracture: Secondary | ICD-10-CM

## 2016-08-09 DIAGNOSIS — M40209 Unspecified kyphosis, site unspecified: Secondary | ICD-10-CM | POA: Diagnosis not present

## 2016-08-09 DIAGNOSIS — D509 Iron deficiency anemia, unspecified: Secondary | ICD-10-CM

## 2016-08-09 DIAGNOSIS — I10 Essential (primary) hypertension: Secondary | ICD-10-CM | POA: Insufficient documentation

## 2016-08-09 DIAGNOSIS — D649 Anemia, unspecified: Secondary | ICD-10-CM | POA: Diagnosis not present

## 2016-08-09 LAB — FOLATE: FOLATE: 16.1 ng/mL (ref >5.9)

## 2016-08-09 LAB — COMPREHENSIVE METABOLIC PANEL
ALT: 10 U/L — ABNORMAL LOW (ref 14–54)
ANION GAP: 4 — AB (ref 5–15)
AST: 17 U/L (ref 15–41)
Albumin: 3.5 g/dL (ref 3.5–5.0)
Alkaline Phosphatase: 58 U/L (ref 38–126)
BILIRUBIN TOTAL: 0.9 mg/dL (ref 0.3–1.2)
BUN: 22 mg/dL — AB (ref 6–20)
CALCIUM: 9.4 mg/dL (ref 8.9–10.3)
CO2: 33 mmol/L — ABNORMAL HIGH (ref 22–32)
Chloride: 102 mmol/L (ref 101–111)
Creatinine, Ser: 0.94 mg/dL (ref 0.44–1.00)
GFR calc Af Amer: >60 mL/min (ref >60)
GFR, EST NON AFRICAN AMERICAN: 55 mL/min — AB (ref >60)
Glucose, Bld: 99 mg/dL (ref 65–99)
Potassium: 4 mmol/L (ref 3.5–5.1)
Sodium: 139 mmol/L (ref 135–145)
TOTAL PROTEIN: 5.9 g/dL — AB (ref 6.5–8.1)

## 2016-08-09 LAB — CBC WITH DIFFERENTIAL/PLATELET
BASOS ABS: 0 10*3/uL (ref 0.0–0.1)
Basophils Relative: 1 %
EOS ABS: 0.2 10*3/uL (ref 0.0–0.7)
EOS PCT: 6 %
HCT: 36.1 % (ref 36.0–46.0)
HEMOGLOBIN: 11.4 g/dL — AB (ref 12.0–15.0)
LYMPHS ABS: 1.1 10*3/uL (ref 0.7–4.0)
Lymphocytes Relative: 28 %
MCH: 29.2 pg (ref 26.0–34.0)
MCHC: 31.6 g/dL (ref 30.0–36.0)
MCV: 92.3 fL (ref 78.0–100.0)
Monocytes Absolute: 0.3 10*3/uL (ref 0.1–1.0)
Monocytes Relative: 7 %
NEUTROS PCT: 59 %
Neutro Abs: 2.3 10*3/uL (ref 1.7–7.7)
PLATELETS: 118 10*3/uL — AB (ref 150–400)
RBC: 3.91 MIL/uL (ref 3.87–5.11)
RDW: 15 % (ref 11.5–15.5)
WBC: 3.9 10*3/uL — AB (ref 4.0–10.5)

## 2016-08-09 LAB — RETICULOCYTES
RBC.: 3.91 MIL/uL (ref 3.87–5.11)
RETIC COUNT ABSOLUTE: 43 10*3/uL (ref 19.0–186.0)
RETIC CT PCT: 1.1 % (ref 0.4–3.1)

## 2016-08-09 LAB — FERRITIN: FERRITIN: 72 ng/mL (ref 11–307)

## 2016-08-09 LAB — SEDIMENTATION RATE: Sed Rate: 2 mm/hr (ref 0–22)

## 2016-08-09 LAB — VITAMIN B12: Vitamin B-12: 218 pg/mL (ref 180–914)

## 2016-08-09 LAB — IRON AND TIBC
Iron: 65 ug/dL (ref 28–170)
Saturation Ratios: 24 % (ref 10.4–31.8)
TIBC: 273 ug/dL (ref 250–450)
UIBC: 208 ug/dL

## 2016-08-09 LAB — C-REACTIVE PROTEIN: CRP: <0.8 mg/dL (ref <1.0)

## 2016-08-09 LAB — LACTATE DEHYDROGENASE: LDH: 140 U/L (ref 98–192)

## 2016-08-09 MED ORDER — CYANOCOBALAMIN 1000 MCG/ML IJ SOLN
1000.0000 ug | Freq: Once | INTRAMUSCULAR | Status: AC
  Administered 2016-08-09: 1000 ug via INTRAMUSCULAR

## 2016-08-09 MED ORDER — CYANOCOBALAMIN 1000 MCG/ML IJ SOLN
INTRAMUSCULAR | Status: AC
  Filled 2016-08-09: qty 1

## 2016-08-09 NOTE — Addendum Note (Signed)
Addended by: Joie Bimler on: 08/09/2016 12:12 PM   Modules accepted: Orders

## 2016-08-09 NOTE — Patient Instructions (Addendum)
[place] at [facility] Discharge Instructions  RECOMMENDATIONS MADE BY THE CONSULTANT AND ANY TEST RESULTS WILL BE SENT TO YOUR REFERRING PHYSICIAN  You were seen today by Dr. Gershon Mussel Schedule your mammogram in December 2018 Lab work in 4 weeks B-12 weekly x 4 and then you will get them monthly Follow up in 4 weeks See Amy up front for appointments    Thank you for choosing [place] at [facility] to provide your oncology and hematology care.  To afford each patient quality time with our provider, please arrive at least 15 minutes before your scheduled appointment time.    If you have a lab appointment with the [facility] please come in thru the  Main Entrance and check in at the main information desk  You need to re-schedule your appointment should you arrive 10 or more minutes late.  We strive to give you quality time with our providers, and arriving late affects you and other patients whose appointments are after yours.  Also, if you no show three or more times for appointments you may be dismissed from the clinic at the providers discretion.     Again, thank you for choosing [facility].  Our hope is that these requests will decrease the amount of time that you wait before being seen by our physicians.       _____________________________________________________________  Should you have questions after your visit to [facility], please contact our office at [phone] between the hours of 8:30 a.m. and 4:30 p.m.  Voicemails left after 4:30 p.m. will not be returned until the following business day.  For prescription refill requests, have your pharmacy contact our office.       Resources For Cancer Patients and their Caregivers ? American Cancer Society: Can assist with transportation, wigs, general needs, runs Look Good Feel Better.        747-406-4850 ? Cancer Care: Provides financial assistance,  online support groups, medication/co-pay assistance.  1-800-813-HOPE 9144119951) ? [facility] Assists [place] cancer patients and their families through emotional , educational and financial support.  (225) 213-2804 ? [place] DSS Where to apply for food stamps, Medicaid and utility assistance. [phone] ? RCATS: Transportation to medical appointments. 636-525-6921 ? Social Security Administration: May apply for disability if have a Stage IV cancer. 6814833620 718-156-1248 ? [employer], Disability and Transit Services: Assists with nutrition, care and transit needs. [facility] Support Programs: @10RELATIVEDAYS @ > Cancer Support Group  2nd Tuesday of the month 1pm-2pm, Journey Room  > Creative Journey  3rd Tuesday of the month 1130am-1pm, Journey Room  > Look Good Feel Better  1st Wednesday of the month 10am-12 noon, Journey Room (Call [facility] to register [phone])

## 2016-08-09 NOTE — Progress Notes (Signed)
Ashley [name] presents today for injection per the provider's orders.  B12 administration without incident; see MAR for injection details.  Patient tolerated procedure well and without incident.  No questions or complaints noted at this time.

## 2016-08-12 LAB — PROTEIN ELECTROPHORESIS, SERUM
A/G RATIO SPE: 1.3 (ref 0.7–1.7)
ALPHA-1-GLOBULIN: 0.3 g/dL (ref 0.0–0.4)
Albumin ELP: 3.2 g/dL (ref 2.9–4.4)
Alpha-2-Globulin: 0.5 g/dL (ref 0.4–1.0)
Beta Globulin: 0.7 g/dL (ref 0.7–1.3)
GLOBULIN, TOTAL: 2.4 g/dL (ref 2.2–3.9)
Gamma Globulin: 0.9 g/dL (ref 0.4–1.8)
TOTAL PROTEIN ELP: 5.6 g/dL — AB (ref 6.0–8.5)

## 2016-08-12 LAB — HOMOCYSTEINE: HOMOCYSTEINE-NORM: 21.7 umol/L — AB (ref 0.0–15.0)

## 2016-08-12 LAB — PATHOLOGIST SMEAR REVIEW

## 2016-08-13 ENCOUNTER — Other Ambulatory Visit (HOSPITAL_COMMUNITY): Payer: Self-pay | Admitting: Oncology

## 2016-08-13 DIAGNOSIS — E538 Deficiency of other specified B group vitamins: Secondary | ICD-10-CM

## 2016-08-13 LAB — METHYLMALONIC ACID, SERUM: METHYLMALONIC ACID, QUANTITATIVE: 1061 nmol/L — AB (ref 0–378)

## 2016-08-13 LAB — INTRINSIC FACTOR ANTIBODIES: INTRINSIC FACTOR: 0.9 [AU]/ml (ref 0.0–1.1)

## 2016-08-13 LAB — IGG, IGA, IGM
IGA: 164 mg/dL (ref 64–422)
IgG (Immunoglobin G), Serum: 846 mg/dL (ref 700–1600)
IgM, Serum: 42 mg/dL (ref 26–217)

## 2016-08-13 LAB — KAPPA/LAMBDA LIGHT CHAINS
KAPPA, LAMDA LIGHT CHAIN RATIO: 1.26 (ref 0.26–1.65)
Kappa free light chain: 23.2 mg/L — ABNORMAL HIGH (ref 3.3–19.4)
Lambda free light chains: 18.4 mg/L (ref 5.7–26.3)

## 2016-08-13 LAB — ERYTHROPOIETIN: Erythropoietin: 16.7 m[IU]/mL (ref 2.6–18.5)

## 2016-08-13 LAB — ANTI-PARIETAL ANTIBODY: PARIETAL CELL ANTIBODY-IGG: 36.8 U — AB (ref 0.0–20.0)

## 2016-08-13 LAB — HAPTOGLOBIN: Haptoglobin: 104 mg/dL (ref 34–200)

## 2016-08-14 DIAGNOSIS — G4733 Obstructive sleep apnea (adult) (pediatric): Secondary | ICD-10-CM | POA: Diagnosis not present

## 2016-08-14 DIAGNOSIS — M419 Scoliosis, unspecified: Secondary | ICD-10-CM | POA: Diagnosis not present

## 2016-08-14 DIAGNOSIS — I10 Essential (primary) hypertension: Secondary | ICD-10-CM | POA: Diagnosis not present

## 2016-08-14 DIAGNOSIS — M1991 Primary osteoarthritis, unspecified site: Secondary | ICD-10-CM | POA: Diagnosis not present

## 2016-08-14 DIAGNOSIS — R0902 Hypoxemia: Secondary | ICD-10-CM | POA: Diagnosis not present

## 2016-08-14 DIAGNOSIS — Z8781 Personal history of (healed) traumatic fracture: Secondary | ICD-10-CM | POA: Diagnosis not present

## 2016-08-14 DIAGNOSIS — Z9981 Dependence on supplemental oxygen: Secondary | ICD-10-CM | POA: Diagnosis not present

## 2016-08-14 DIAGNOSIS — Z853 Personal history of malignant neoplasm of breast: Secondary | ICD-10-CM | POA: Diagnosis not present

## 2016-08-14 DIAGNOSIS — Z79891 Long term (current) use of opiate analgesic: Secondary | ICD-10-CM | POA: Diagnosis not present

## 2016-08-15 LAB — IMMUNOFIXATION ELECTROPHORESIS
IGG (IMMUNOGLOBIN G), SERUM: 983 mg/dL (ref 700–1600)
IgA: 174 mg/dL (ref 64–422)
IgM, Serum: 48 mg/dL (ref 26–217)
Total Protein ELP: 5.7 g/dL — ABNORMAL LOW (ref 6.0–8.5)

## 2016-08-16 ENCOUNTER — Ambulatory Visit (HOSPITAL_COMMUNITY): Payer: Medicare Other

## 2016-08-19 DIAGNOSIS — J9610 Chronic respiratory failure, unspecified whether with hypoxia or hypercapnia: Secondary | ICD-10-CM | POA: Diagnosis not present

## 2016-08-19 DIAGNOSIS — R062 Wheezing: Secondary | ICD-10-CM | POA: Diagnosis not present

## 2016-08-19 DIAGNOSIS — R0602 Shortness of breath: Secondary | ICD-10-CM | POA: Diagnosis not present

## 2016-08-22 ENCOUNTER — Ambulatory Visit (HOSPITAL_COMMUNITY): Payer: Medicare Other

## 2016-08-22 DIAGNOSIS — M1991 Primary osteoarthritis, unspecified site: Secondary | ICD-10-CM | POA: Diagnosis not present

## 2016-08-22 DIAGNOSIS — Z853 Personal history of malignant neoplasm of breast: Secondary | ICD-10-CM | POA: Diagnosis not present

## 2016-08-22 DIAGNOSIS — M419 Scoliosis, unspecified: Secondary | ICD-10-CM | POA: Diagnosis not present

## 2016-08-22 DIAGNOSIS — Z8781 Personal history of (healed) traumatic fracture: Secondary | ICD-10-CM | POA: Diagnosis not present

## 2016-08-22 DIAGNOSIS — I10 Essential (primary) hypertension: Secondary | ICD-10-CM | POA: Diagnosis not present

## 2016-08-22 DIAGNOSIS — Z9981 Dependence on supplemental oxygen: Secondary | ICD-10-CM | POA: Diagnosis not present

## 2016-08-22 DIAGNOSIS — Z79891 Long term (current) use of opiate analgesic: Secondary | ICD-10-CM | POA: Diagnosis not present

## 2016-08-22 DIAGNOSIS — R0902 Hypoxemia: Secondary | ICD-10-CM | POA: Diagnosis not present

## 2016-08-22 DIAGNOSIS — G4733 Obstructive sleep apnea (adult) (pediatric): Secondary | ICD-10-CM | POA: Diagnosis not present

## 2016-08-27 DIAGNOSIS — M1991 Primary osteoarthritis, unspecified site: Secondary | ICD-10-CM | POA: Diagnosis not present

## 2016-08-27 DIAGNOSIS — Z6821 Body mass index (BMI) 21.0-21.9, adult: Secondary | ICD-10-CM | POA: Diagnosis not present

## 2016-08-27 DIAGNOSIS — G894 Chronic pain syndrome: Secondary | ICD-10-CM | POA: Diagnosis not present

## 2016-08-27 DIAGNOSIS — I10 Essential (primary) hypertension: Secondary | ICD-10-CM | POA: Diagnosis not present

## 2016-08-27 DIAGNOSIS — E063 Autoimmune thyroiditis: Secondary | ICD-10-CM | POA: Diagnosis not present

## 2016-08-27 DIAGNOSIS — Z1389 Encounter for screening for other disorder: Secondary | ICD-10-CM | POA: Diagnosis not present

## 2016-08-27 DIAGNOSIS — M2550 Pain in unspecified joint: Secondary | ICD-10-CM | POA: Diagnosis not present

## 2016-08-29 DIAGNOSIS — Z9981 Dependence on supplemental oxygen: Secondary | ICD-10-CM | POA: Diagnosis not present

## 2016-08-29 DIAGNOSIS — Z8781 Personal history of (healed) traumatic fracture: Secondary | ICD-10-CM | POA: Diagnosis not present

## 2016-08-29 DIAGNOSIS — I10 Essential (primary) hypertension: Secondary | ICD-10-CM | POA: Diagnosis not present

## 2016-08-29 DIAGNOSIS — M419 Scoliosis, unspecified: Secondary | ICD-10-CM | POA: Diagnosis not present

## 2016-08-29 DIAGNOSIS — G4733 Obstructive sleep apnea (adult) (pediatric): Secondary | ICD-10-CM | POA: Diagnosis not present

## 2016-08-29 DIAGNOSIS — Z79891 Long term (current) use of opiate analgesic: Secondary | ICD-10-CM | POA: Diagnosis not present

## 2016-08-29 DIAGNOSIS — Z853 Personal history of malignant neoplasm of breast: Secondary | ICD-10-CM | POA: Diagnosis not present

## 2016-08-29 DIAGNOSIS — M1991 Primary osteoarthritis, unspecified site: Secondary | ICD-10-CM | POA: Diagnosis not present

## 2016-08-29 DIAGNOSIS — R0902 Hypoxemia: Secondary | ICD-10-CM | POA: Diagnosis not present

## 2016-08-30 ENCOUNTER — Encounter (HOSPITAL_COMMUNITY): Payer: Medicare Other | Attending: Oncology

## 2016-08-30 ENCOUNTER — Encounter (HOSPITAL_COMMUNITY): Payer: Medicare Other

## 2016-08-30 VITALS — BP 108/50 | HR 62 | Temp 97.7°F | Resp 20

## 2016-08-30 DIAGNOSIS — Z170 Estrogen receptor positive status [ER+]: Secondary | ICD-10-CM | POA: Diagnosis not present

## 2016-08-30 DIAGNOSIS — I10 Essential (primary) hypertension: Secondary | ICD-10-CM | POA: Diagnosis not present

## 2016-08-30 DIAGNOSIS — C50912 Malignant neoplasm of unspecified site of left female breast: Secondary | ICD-10-CM | POA: Insufficient documentation

## 2016-08-30 DIAGNOSIS — Z9049 Acquired absence of other specified parts of digestive tract: Secondary | ICD-10-CM | POA: Diagnosis not present

## 2016-08-30 DIAGNOSIS — D61818 Other pancytopenia: Secondary | ICD-10-CM | POA: Diagnosis not present

## 2016-08-30 DIAGNOSIS — M810 Age-related osteoporosis without current pathological fracture: Secondary | ICD-10-CM | POA: Diagnosis not present

## 2016-08-30 DIAGNOSIS — E538 Deficiency of other specified B group vitamins: Secondary | ICD-10-CM

## 2016-08-30 DIAGNOSIS — E079 Disorder of thyroid, unspecified: Secondary | ICD-10-CM | POA: Diagnosis not present

## 2016-08-30 DIAGNOSIS — M40209 Unspecified kyphosis, site unspecified: Secondary | ICD-10-CM | POA: Diagnosis not present

## 2016-08-30 DIAGNOSIS — Z9889 Other specified postprocedural states: Secondary | ICD-10-CM | POA: Diagnosis not present

## 2016-08-30 MED ORDER — CYANOCOBALAMIN 1000 MCG/ML IJ SOLN
INTRAMUSCULAR | Status: AC
  Filled 2016-08-30: qty 1

## 2016-08-30 MED ORDER — CYANOCOBALAMIN 1000 MCG/ML IJ SOLN
1000.0000 ug | Freq: Once | INTRAMUSCULAR | Status: AC
  Administered 2016-08-30: 1000 ug via INTRAMUSCULAR

## 2016-08-30 NOTE — Progress Notes (Signed)
Ashley [name] presents today for injection per MD orders. Vitamin b12 1061mcg administered IM in right Upper Arm. Administration without incident. Patient tolerated well.

## 2016-09-01 LAB — GASTRIN: GASTRIN: 146 pg/mL — AB (ref 0–115)

## 2016-09-02 DIAGNOSIS — Z79891 Long term (current) use of opiate analgesic: Secondary | ICD-10-CM | POA: Diagnosis not present

## 2016-09-02 DIAGNOSIS — I10 Essential (primary) hypertension: Secondary | ICD-10-CM | POA: Diagnosis not present

## 2016-09-02 DIAGNOSIS — R0902 Hypoxemia: Secondary | ICD-10-CM | POA: Diagnosis not present

## 2016-09-02 DIAGNOSIS — Z853 Personal history of malignant neoplasm of breast: Secondary | ICD-10-CM | POA: Diagnosis not present

## 2016-09-02 DIAGNOSIS — M1991 Primary osteoarthritis, unspecified site: Secondary | ICD-10-CM | POA: Diagnosis not present

## 2016-09-02 DIAGNOSIS — Z8781 Personal history of (healed) traumatic fracture: Secondary | ICD-10-CM | POA: Diagnosis not present

## 2016-09-02 DIAGNOSIS — G4733 Obstructive sleep apnea (adult) (pediatric): Secondary | ICD-10-CM | POA: Diagnosis not present

## 2016-09-02 DIAGNOSIS — M419 Scoliosis, unspecified: Secondary | ICD-10-CM | POA: Diagnosis not present

## 2016-09-02 DIAGNOSIS — Z9981 Dependence on supplemental oxygen: Secondary | ICD-10-CM | POA: Diagnosis not present

## 2016-09-09 ENCOUNTER — Encounter (HOSPITAL_COMMUNITY): Payer: Medicare Other

## 2016-09-09 ENCOUNTER — Encounter (HOSPITAL_BASED_OUTPATIENT_CLINIC_OR_DEPARTMENT_OTHER): Payer: Medicare Other

## 2016-09-09 ENCOUNTER — Encounter (HOSPITAL_BASED_OUTPATIENT_CLINIC_OR_DEPARTMENT_OTHER): Payer: Medicare Other | Admitting: Oncology

## 2016-09-09 ENCOUNTER — Encounter (HOSPITAL_COMMUNITY): Payer: Self-pay

## 2016-09-09 VITALS — BP 145/63 | HR 65 | Temp 97.6°F | Resp 20 | Wt 107.4 lb

## 2016-09-09 DIAGNOSIS — Z853 Personal history of malignant neoplasm of breast: Secondary | ICD-10-CM

## 2016-09-09 DIAGNOSIS — D61818 Other pancytopenia: Secondary | ICD-10-CM

## 2016-09-09 DIAGNOSIS — D72819 Decreased white blood cell count, unspecified: Secondary | ICD-10-CM | POA: Diagnosis not present

## 2016-09-09 DIAGNOSIS — D696 Thrombocytopenia, unspecified: Secondary | ICD-10-CM

## 2016-09-09 DIAGNOSIS — C50912 Malignant neoplasm of unspecified site of left female breast: Secondary | ICD-10-CM | POA: Diagnosis not present

## 2016-09-09 DIAGNOSIS — M810 Age-related osteoporosis without current pathological fracture: Secondary | ICD-10-CM

## 2016-09-09 DIAGNOSIS — D649 Anemia, unspecified: Secondary | ICD-10-CM

## 2016-09-09 DIAGNOSIS — Z9049 Acquired absence of other specified parts of digestive tract: Secondary | ICD-10-CM | POA: Diagnosis not present

## 2016-09-09 DIAGNOSIS — Z9889 Other specified postprocedural states: Secondary | ICD-10-CM | POA: Diagnosis not present

## 2016-09-09 DIAGNOSIS — Z170 Estrogen receptor positive status [ER+]: Secondary | ICD-10-CM | POA: Diagnosis not present

## 2016-09-09 DIAGNOSIS — M40209 Unspecified kyphosis, site unspecified: Secondary | ICD-10-CM | POA: Diagnosis not present

## 2016-09-09 DIAGNOSIS — I10 Essential (primary) hypertension: Secondary | ICD-10-CM | POA: Diagnosis not present

## 2016-09-09 DIAGNOSIS — E079 Disorder of thyroid, unspecified: Secondary | ICD-10-CM | POA: Diagnosis not present

## 2016-09-09 LAB — CBC WITH DIFFERENTIAL/PLATELET
BASOS ABS: 0 10*3/uL (ref 0.0–0.1)
Basophils Relative: 0 %
EOS ABS: 0.2 10*3/uL (ref 0.0–0.7)
Eosinophils Relative: 4 %
HCT: 38.1 % (ref 36.0–46.0)
HEMOGLOBIN: 11.8 g/dL — AB (ref 12.0–15.0)
LYMPHS ABS: 1.4 10*3/uL (ref 0.7–4.0)
LYMPHS PCT: 28 %
MCH: 29.1 pg (ref 26.0–34.0)
MCHC: 31 g/dL (ref 30.0–36.0)
MCV: 93.8 fL (ref 78.0–100.0)
Monocytes Absolute: 0.5 10*3/uL (ref 0.1–1.0)
Monocytes Relative: 9 %
NEUTROS PCT: 59 %
Neutro Abs: 2.9 10*3/uL (ref 1.7–7.7)
PLATELETS: 169 10*3/uL (ref 150–400)
RBC: 4.06 MIL/uL (ref 3.87–5.11)
RDW: 14.5 % (ref 11.5–15.5)
WBC: 5.1 10*3/uL (ref 4.0–10.5)

## 2016-09-09 LAB — BASIC METABOLIC PANEL
ANION GAP: 6 (ref 5–15)
BUN: 16 mg/dL (ref 6–20)
CHLORIDE: 100 mmol/L — AB (ref 101–111)
CO2: 31 mmol/L (ref 22–32)
Calcium: 9.3 mg/dL (ref 8.9–10.3)
Creatinine, Ser: 0.75 mg/dL (ref 0.44–1.00)
GFR calc non Af Amer: >60 mL/min (ref >60)
Glucose, Bld: 105 mg/dL — ABNORMAL HIGH (ref 65–99)
POTASSIUM: 4.1 mmol/L (ref 3.5–5.1)
SODIUM: 137 mmol/L (ref 135–145)

## 2016-09-09 LAB — IRON AND TIBC
Iron: 43 ug/dL (ref 28–170)
SATURATION RATIOS: 15 % (ref 10.4–31.8)
TIBC: 283 ug/dL (ref 250–450)
UIBC: 240 ug/dL

## 2016-09-09 LAB — VITAMIN B12: Vitamin B-12: 725 pg/mL (ref 180–914)

## 2016-09-09 LAB — FERRITIN: FERRITIN: 69 ng/mL (ref 11–307)

## 2016-09-09 MED ORDER — CYANOCOBALAMIN 1000 MCG/ML IJ SOLN
1000.0000 ug | Freq: Once | INTRAMUSCULAR | Status: AC
  Administered 2016-09-09: 1000 ug via INTRAMUSCULAR

## 2016-09-09 MED ORDER — CYANOCOBALAMIN 1000 MCG/ML IJ SOLN
INTRAMUSCULAR | Status: AC
Start: 2016-09-09 — End: 2016-09-09
  Filled 2016-09-09: qty 1

## 2016-09-09 NOTE — Progress Notes (Signed)
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 49m in size  Lumpectomy and sentinel lymph node biopsy 04/24/2009  Arimidex 04/2009  Severe Osteoporosis/Kyphosis with refusal of prior bisphosphonate therapy  DEXA 02/2014 osteoporosis  Mild pancytopenia  CURRENT THERAPY: Observation  INTERVAL HISTORY: Ashley [id number]. female returns for follow-up of her breast cancer. She is doing well.  She has osteoporosis but has always refused intervention. She could not tolerate calcium or vitamin D. She says it made her feel like she had "heart trouble."  The patient is accompanied by a friend today.  On review of systems, the patient reports she feels some improvement in her energy level since starting B12 injections. She states that her Vitamin D supplements that were prescribed by her PCP are "tearing her up".    MEDICAL HISTORY: Past Medical History:  Diagnosis Date  . Acid reflux   . Arthritis   . At risk for falls   . Back pain   . Bilateral ovarian cysts 02/20/2012  . [place] ([facility]   . Edema   . Hypertension   . Left-sided breast cancer 11/14/2011   Started Arimidex on 07/25/2009. Stage IB, grade 1, ER 99%, PR 100%, Her2 negative, left-sided breast cancer, 8 mm in size, S/P lumpectomy on 04/24/2010, and sentinel node biopsy.  Ki-67 marker 6%.  . Osteoporosis 03/06/2012   Switched from Arimidex to Tamoxifen as a result of osteoporosis   . Other [facility]topenia ([facility] 05/29/2014  . Thyroid disease     has Hypothyroidism; Essential hypertension; GERD; Left-sided breast cancer; Bilateral ovarian cysts; Osteoporosis; Other pancytopenia ([facility]; Iron deficiency anemia; Rib fractures; Hypoxia; Humerus shaft fracture; Fall as cause of accidental injury at home as place of occurrence; Multiple rib fractures; Multiple closed fractures of ribs of left side; and Closed nondisplaced  fracture of surgical neck of left humerus on her problem list.     No history exists.     is allergic to ciprofloxacin; codeine phosphate; doxycycline; erythromycin ethylsuccinate; levofloxacin; penicillins; sulfamethoxazole; tetracycline hcl; and lidocaine.  Ms. SMezerahad no medications administered during this visit.  SURGICAL HISTORY: Past Surgical History:  Procedure Laterality Date  . APPENDECTOMY    . BREAST SURGERY    . CHOLECYSTECTOMY    . HERNIA REPAIR    . HIP FRACTURE SURGERY    . TONSILLECTOMY    . TOTAL KNEE REVISION      SOCIAL HISTORY: Social History   Social History  . Marital status: Divorced    Spouse name: N/A  . Number of children: N/A  . Years of education: N/A   Occupational History  . Not on file.   Social History Main Topics  . Smoking status: Never Smoker  . Smokeless tobacco: Never Used  . Alcohol use No  . Drug use: No  . Sexual activity: Not on file   Other Topics Concern  . Not on file   Social History Narrative  . No narrative on file    FAMILY HISTORY: No family history on file.  Review of Systems  Constitutional: Negative for malaise/fatigue (Improved energy.).       Poor appetite.  Eyes: Negative for pain.  Respiratory: Negative.   Cardiovascular: Negative.  Negative for chest pain.       Patient reports Vitamin D causes chest pain.  Gastrointestinal: Negative.  Negative for abdominal pain.  Skin: Negative.  Neurological: Negative.  Negative for weakness.  Endo/Heme/Allergies: Negative.   Psychiatric/Behavioral: Negative.     PHYSICAL EXAMINATION  ECOG PERFORMANCE STATUS: 1 - Symptomatic but completely ambulatory  (Osteoporosis)  Vitals:   09/09/16 1539  BP: (!) 145/63  Pulse: 65  Resp: 20  Temp: 97.6 F (36.4 C)    Physical Exam  Constitutional: She is oriented to person, place, and time.  Severe kyphosis. In wheelchair.  HENT:  Head: Normocephalic and atraumatic.  Eyes: Conjunctivae and EOM are  normal. Pupils are equal, round, and reactive to light.  Neck: Normal range of motion. Neck supple.  Cardiovascular: Normal rate, regular rhythm and normal heart sounds.  Exam reveals no gallop and no friction rub.   No murmur heard. Pulmonary/Chest: Effort normal and breath sounds normal. No respiratory distress. She has no wheezes. She has no rales. She exhibits no tenderness.  Abdominal: Soft. Bowel sounds are normal.  Musculoskeletal:  Wearing bilateral TED hose to the thigh  Neurological: She is alert and oriented to person, place, and time.  Skin: Skin is warm and dry.  Psychiatric: Mood, memory, affect and judgment normal.    LABORATORY DATA: I have reviewed the data as listed. CBC    Component Value Date/Time   WBC 5.1 09/09/2016 1424   RBC 4.06 09/09/2016 1424   HGB 11.8 (L) 09/09/2016 1424   HCT 38.1 09/09/2016 1424   HCT 31.0 (L) 03/03/2016 0627   PLT 169 09/09/2016 1424   MCV 93.8 09/09/2016 1424   MCH 29.1 09/09/2016 1424   MCHC 31.0 09/09/2016 1424   RDW 14.5 09/09/2016 1424   LYMPHSABS 1.4 09/09/2016 1424   MONOABS 0.5 09/09/2016 1424   EOSABS 0.2 09/09/2016 1424   BASOSABS 0.0 09/09/2016 1424   CMP     Component Value Date/Time   NA 137 09/09/2016 1424   K 4.1 09/09/2016 1424   CL 100 (L) 09/09/2016 1424   CO2 31 09/09/2016 1424   GLUCOSE 105 (H) 09/09/2016 1424   BUN 16 09/09/2016 1424   CREATININE 0.75 09/09/2016 1424   CALCIUM 9.3 09/09/2016 1424   PROT 5.9 (L) 08/09/2016 1030   ALBUMIN 3.5 08/09/2016 1030   AST 17 08/09/2016 1030   ALT 10 (L) 08/09/2016 1030   ALKPHOS 58 08/09/2016 1030   BILITOT 0.9 08/09/2016 1030   GFRNONAA >60 09/09/2016 1424   GFRAA >60 09/09/2016 1424      ASSESSMENT and THERAPY PLAN:  Stage IB, grade 1, ER+ 99%, PR+ 100%, Her-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 65m in size Kyphosis, osteoporosis Iron deficiency with serum ferritin of 8 ng/ml February 2016 Thrombocytopenia  Left-sided breast cancer Stage  IB, grade 1, ER +99%, PR +100%, HER-2/neu negative, left-sided breast cancer 8 mm in size status post lumpectomy and sentinel lymph node biopsy which was negative. Ki-67 marker was low 6% with surgery on 04/24/2009, followed by Arimidex therapy which is completed.  Clinically NED. Continue observation.  Mammogram in Dec 2017 was BIRADS 2.  She will be due for her next mammogram in Dec 2018.     Osteoporosis Osteoporosis with severe kyphosis.  She is on Ca++ and Vit D. Managed by primary care provider.  Other pancytopenia ([facility] - Labs reviewed today. Leukopenia and thrombocytopenia have improved, mild anemia. Continue regular B12 injections monthly.  - I encouraged the patient to follow up with Dr. FGerarda Fractionregarding her questions about Vitamin D.  - RTC for follow up in 3 months. Orders Placed This Encounter  Procedures  .  CBC with Differential    Standing Status:   Future    Standing Expiration Date:   01/09/2017  . Comprehensive metabolic panel    Standing Status:   Future    Standing Expiration Date:   01/09/2017  . Iron and TIBC    Standing Status:   Future    Standing Expiration Date:   01/09/2017  . Ferritin    Standing Status:   Future    Standing Expiration Date:   01/09/2017  . Vitamin B12    Standing Status:   Future    Standing Expiration Date:   01/09/2017    All questions were answered. The patient knows to call the clinic with any problems, questions or concerns. We can certainly see the patient much sooner if necessary.  This document serves as a record of services personally performed by Twana First, MD. It was created on her behalf by Maryla Morrow, a trained medical scribe. The creation of this record is based on the scribe's personal observations and the provider's statements to them. This document has been checked and approved by the attending provider.  I have reviewed the above documentation for accuracy and completeness, and I agree with the above.  This  note was electronically signed by:  Twana First, MD  09/09/2016

## 2016-09-09 NOTE — Progress Notes (Signed)
Ashley [name] presents today for injection per MD orders. B12 1000 mcg administered IM in right Upper Arm. Administration without incident. Patient tolerated well.

## 2016-09-09 NOTE — Patient Instructions (Signed)
[facility] at [facility] Discharge Instructions  RECOMMENDATIONS MADE BY THE CONSULTANT AND ANY TEST RESULTS WILL BE SENT TO YOUR REFERRING PHYSICIAN.  You were seen today by Dr. Louise Zhou Follow up in 3 months with lab work See Amy up front for appointments   Thank you for choosing [facility] at [facility] to provide your oncology and hematology care.  To afford each patient quality time with our provider, please arrive at least 15 minutes before your scheduled appointment time.    If you have a lab appointment with the Cancer Center please come in thru the  Main Entrance and check in at the main information desk  You need to re-schedule your appointment should you arrive 10 or more minutes late.  We strive to give you quality time with our providers, and arriving late affects you and other patients whose appointments are after yours.  Also, if you no show three or more times for appointments you may be dismissed from the clinic at the providers discretion.     Again, thank you for choosing [facility].  Our hope is that these requests will decrease the amount of time that you wait before being seen by our physicians.       _____________________________________________________________  Should you have questions after your visit to [facility], please contact our office at [phone] between the hours of 8:30 a.m. and 4:30 p.m.  Voicemails left after 4:30 p.m. will not be returned until the following business day.  For prescription refill requests, have your pharmacy contact our office.       Resources For Cancer Patients and their Caregivers ? American Cancer Society: Can assist with transportation, wigs, general needs, runs Look Good Feel Better.        1-888-227-6333 ? Cancer Care: Provides financial assistance, online support groups, medication/co-pay assistance.  1-800-813-HOPE (4673) ? [facility] Assists [place] cancer patients and their families through emotional , educational and financial support.  336-427-4357 ? [place] DSS Where to apply for food stamps, Medicaid and utility assistance. 336-342-1394 ? [employer]: Transportation to medical appointments. 336-347-2287 ? Social Security Administration: May apply for disability if have a Stage IV cancer. 336-342-7796 1-800-772-1213 ? [place] Aging, Disability and Transit Services: Assists with nutrition, care and transit needs. 336-349-2343  Cancer Center Support Programs: @10RELATIVEDAYS@ > Cancer Support Group  2nd Tuesday of the month 1pm-2pm, Journey Room  > Creative Journey  3rd Tuesday of the month 1130am-1pm, Journey Room  > Look Good Feel Better  1st Wednesday of the month 10am-12 noon, Journey Room (Call [employer] to register [phone])    

## 2016-09-10 DIAGNOSIS — I10 Essential (primary) hypertension: Secondary | ICD-10-CM | POA: Diagnosis not present

## 2016-09-10 DIAGNOSIS — Z853 Personal history of malignant neoplasm of breast: Secondary | ICD-10-CM | POA: Diagnosis not present

## 2016-09-10 DIAGNOSIS — G4733 Obstructive sleep apnea (adult) (pediatric): Secondary | ICD-10-CM | POA: Diagnosis not present

## 2016-09-10 DIAGNOSIS — Z79891 Long term (current) use of opiate analgesic: Secondary | ICD-10-CM | POA: Diagnosis not present

## 2016-09-10 DIAGNOSIS — R0902 Hypoxemia: Secondary | ICD-10-CM | POA: Diagnosis not present

## 2016-09-10 DIAGNOSIS — Z8781 Personal history of (healed) traumatic fracture: Secondary | ICD-10-CM | POA: Diagnosis not present

## 2016-09-10 DIAGNOSIS — M1991 Primary osteoarthritis, unspecified site: Secondary | ICD-10-CM | POA: Diagnosis not present

## 2016-09-10 DIAGNOSIS — Z9981 Dependence on supplemental oxygen: Secondary | ICD-10-CM | POA: Diagnosis not present

## 2016-09-10 DIAGNOSIS — M419 Scoliosis, unspecified: Secondary | ICD-10-CM | POA: Diagnosis not present

## 2016-09-17 ENCOUNTER — Encounter (HOSPITAL_COMMUNITY): Payer: Self-pay

## 2016-09-17 ENCOUNTER — Encounter (HOSPITAL_COMMUNITY): Payer: Medicare Other | Attending: Oncology

## 2016-09-17 VITALS — BP 123/68 | HR 65 | Temp 97.4°F | Resp 18

## 2016-09-17 DIAGNOSIS — E538 Deficiency of other specified B group vitamins: Secondary | ICD-10-CM

## 2016-09-17 DIAGNOSIS — D61818 Other pancytopenia: Secondary | ICD-10-CM

## 2016-09-17 MED ORDER — CYANOCOBALAMIN 1000 MCG/ML IJ SOLN
1000.0000 ug | Freq: Once | INTRAMUSCULAR | Status: AC
  Administered 2016-09-17: 1000 ug via INTRAMUSCULAR

## 2016-09-17 MED ORDER — CYANOCOBALAMIN 1000 MCG/ML IJ SOLN
INTRAMUSCULAR | Status: AC
  Filled 2016-09-17: qty 1

## 2016-09-17 NOTE — Progress Notes (Signed)
Clemon Chambers tolerated Vit B12 injection well without complaints or incident. VSS Pt discharged via wheelchair in satisfactory condition accompanied by caregiver

## 2016-09-17 NOTE — Patient Instructions (Signed)
[facility] at [facility] Discharge Instructions  RECOMMENDATIONS MADE BY THE CONSULTANT AND ANY TEST RESULTS WILL BE SENT TO YOUR REFERRING PHYSICIAN.  Received Vit B12 injection today. Follow-up as scheduled. Call clinic for any questions or concerns  Thank you for choosing [facility] at [facility] to provide your oncology and hematology care.  To afford each patient quality time with our provider, please arrive at least 15 minutes before your scheduled appointment time.    If you have a lab appointment with the Cancer Center please come in thru the  Main Entrance and check in at the main information desk  You need to re-schedule your appointment should you arrive 10 or more minutes late.  We strive to give you quality time with our providers, and arriving late affects you and other patients whose appointments are after yours.  Also, if you no show three or more times for appointments you may be dismissed from the clinic at the providers discretion.     Again, thank you for choosing [facility].  Our hope is that these requests will decrease the amount of time that you wait before being seen by our physicians.       _____________________________________________________________  Should you have questions after your visit to  Cancer Center, please contact our office at [phone] between the hours of 8:30 a.m. and 4:30 p.m.  Voicemails left after 4:30 p.m. will not be returned until the following business day.  For prescription refill requests, have your pharmacy contact our office.       Resources For Cancer Patients and their Caregivers ? American Cancer Society: Can assist with transportation, wigs, general needs, runs Look Good Feel Better.        1-888-227-6333 ? Cancer Care: Provides financial assistance, online support groups, medication/co-pay assistance.  1-800-813-HOPE (4673) ? [facility] Assists [place] cancer patients and their families through emotional , educational and financial support.  336-427-4357 ? [place] DSS Where to apply for food stamps, Medicaid and utility assistance. 336-342-1394 ? [employer]: Transportation to medical appointments. 336-347-2287 ? Social Security Administration: May apply for disability if have a Stage IV cancer. 336-342-7796 1-800-772-1213 ? [place] Aging, Disability and Transit Services: Assists with nutrition, care and transit needs. 336-349-2343  Cancer Center Support Programs: @10RELATIVEDAYS@ > Cancer Support Group  2nd Tuesday of the month 1pm-2pm, Journey Room  > Creative Journey  3rd Tuesday of the month 1130am-1pm, Journey Room  > Look Good Feel Better  1st Wednesday of the month 10am-12 noon, Journey Room (Call [employer] to register 1-800-395-5775)   

## 2016-09-19 DIAGNOSIS — R0602 Shortness of breath: Secondary | ICD-10-CM | POA: Diagnosis not present

## 2016-09-19 DIAGNOSIS — J9610 Chronic respiratory failure, unspecified whether with hypoxia or hypercapnia: Secondary | ICD-10-CM | POA: Diagnosis not present

## 2016-09-19 DIAGNOSIS — R062 Wheezing: Secondary | ICD-10-CM | POA: Diagnosis not present

## 2016-09-30 ENCOUNTER — Ambulatory Visit (HOSPITAL_COMMUNITY): Payer: Medicare Other

## 2016-10-10 ENCOUNTER — Ambulatory Visit (HOSPITAL_COMMUNITY): Payer: Medicare Other

## 2016-10-11 ENCOUNTER — Other Ambulatory Visit: Payer: Self-pay | Admitting: Obstetrics & Gynecology

## 2016-10-14 ENCOUNTER — Encounter: Payer: Self-pay | Admitting: Internal Medicine

## 2016-10-17 ENCOUNTER — Encounter (HOSPITAL_COMMUNITY): Payer: Self-pay

## 2016-10-17 ENCOUNTER — Encounter (HOSPITAL_COMMUNITY): Payer: Medicare Other | Attending: Oncology

## 2016-10-17 ENCOUNTER — Ambulatory Visit (HOSPITAL_COMMUNITY): Payer: Medicare Other

## 2016-10-17 VITALS — BP 130/63 | HR 62 | Temp 97.5°F | Resp 16

## 2016-10-17 DIAGNOSIS — Z170 Estrogen receptor positive status [ER+]: Secondary | ICD-10-CM | POA: Insufficient documentation

## 2016-10-17 DIAGNOSIS — E079 Disorder of thyroid, unspecified: Secondary | ICD-10-CM | POA: Insufficient documentation

## 2016-10-17 DIAGNOSIS — M40209 Unspecified kyphosis, site unspecified: Secondary | ICD-10-CM | POA: Insufficient documentation

## 2016-10-17 DIAGNOSIS — D61818 Other pancytopenia: Secondary | ICD-10-CM | POA: Insufficient documentation

## 2016-10-17 DIAGNOSIS — Z9049 Acquired absence of other specified parts of digestive tract: Secondary | ICD-10-CM | POA: Insufficient documentation

## 2016-10-17 DIAGNOSIS — I10 Essential (primary) hypertension: Secondary | ICD-10-CM | POA: Insufficient documentation

## 2016-10-17 DIAGNOSIS — E538 Deficiency of other specified B group vitamins: Secondary | ICD-10-CM

## 2016-10-17 DIAGNOSIS — C50912 Malignant neoplasm of unspecified site of left female breast: Secondary | ICD-10-CM | POA: Insufficient documentation

## 2016-10-17 DIAGNOSIS — Z9889 Other specified postprocedural states: Secondary | ICD-10-CM | POA: Insufficient documentation

## 2016-10-17 DIAGNOSIS — M810 Age-related osteoporosis without current pathological fracture: Secondary | ICD-10-CM | POA: Insufficient documentation

## 2016-10-17 HISTORY — DX: Deficiency of other specified B group vitamins: E53.8

## 2016-10-17 MED ORDER — CYANOCOBALAMIN 1000 MCG/ML IJ SOLN
1000.0000 ug | Freq: Once | INTRAMUSCULAR | Status: AC
  Administered 2016-10-17: 1000 ug via INTRAMUSCULAR

## 2016-10-17 MED ORDER — CYANOCOBALAMIN 1000 MCG/ML IJ SOLN
INTRAMUSCULAR | Status: AC
  Filled 2016-10-17: qty 1

## 2016-10-17 NOTE — Progress Notes (Signed)
Ashley [name] presents today for injection per MD orders. B12 1000 mcg administered IM in right Upper Arm. Administration without incident. Patient tolerated well.

## 2016-10-17 NOTE — Patient Instructions (Signed)
[facility] at [facility] Discharge Instructions  RECOMMENDATIONS MADE BY THE CONSULTANT AND ANY TEST RESULTS WILL BE SENT TO YOUR REFERRING PHYSICIAN.  Vitamin B12 1000 mcg injection given as ordered.  Thank you for choosing [facility] at [facility] to provide your oncology and hematology care.  To afford each patient quality time with our provider, please arrive at least 15 minutes before your scheduled appointment time.    If you have a lab appointment with the Cancer Center please come in thru the  Main Entrance and check in at the main information desk  You need to re-schedule your appointment should you arrive 10 or more minutes late.  We strive to give you quality time with our providers, and arriving late affects you and other patients whose appointments are after yours.  Also, if you no show three or more times for appointments you may be dismissed from the clinic at the providers discretion.     Again, thank you for choosing [facility].  Our hope is that these requests will decrease the amount of time that you wait before being seen by our physicians.       _____________________________________________________________  Should you have questions after your visit to [facility], please contact our office at [phone] between the hours of 8:30 a.m. and 4:30 p.m.  Voicemails left after 4:30 p.m. will not be returned until the following business day.  For prescription refill requests, have your pharmacy contact our office.       Resources For Cancer Patients and their Caregivers ? American Cancer Society: Can assist with transportation, wigs, general needs, runs Look Good Feel Better.        1-888-227-6333 ? Cancer Care: Provides financial assistance, online support groups, medication/co-pay assistance.  1-800-813-HOPE (4673) ? [facility] Assists [place] cancer patients and  their families through emotional , educational and financial support.  336-427-4357 ? [place] DSS Where to apply for food stamps, Medicaid and utility assistance. 336-342-1394 ? [employer]: Transportation to medical appointments. 336-347-2287 ? Social Security Administration: May apply for disability if have a Stage IV cancer. 336-342-7796 1-800-772-1213 ? [place] Aging, Disability and Transit Services: Assists with nutrition, care and transit needs. 336-349-2343  Cancer Center Support Programs: @10RELATIVEDAYS@ > Cancer Support Group  2nd Tuesday of the month 1pm-2pm, Journey Room  > Creative Journey  3rd Tuesday of the month 1130am-1pm, Journey Room  > Look Good Feel Better  1st Wednesday of the month 10am-12 noon, Journey Room (Call [employer] to register 1-800-395-5775)   

## 2016-10-19 DIAGNOSIS — J9610 Chronic respiratory failure, unspecified whether with hypoxia or hypercapnia: Secondary | ICD-10-CM | POA: Diagnosis not present

## 2016-10-19 DIAGNOSIS — R0602 Shortness of breath: Secondary | ICD-10-CM | POA: Diagnosis not present

## 2016-10-19 DIAGNOSIS — R062 Wheezing: Secondary | ICD-10-CM | POA: Diagnosis not present

## 2016-11-18 ENCOUNTER — Encounter (HOSPITAL_COMMUNITY): Payer: Self-pay

## 2016-11-18 ENCOUNTER — Encounter (HOSPITAL_COMMUNITY): Payer: Medicare Other | Attending: Oncology

## 2016-11-18 VITALS — BP 110/50 | HR 72 | Temp 97.7°F | Resp 18

## 2016-11-18 DIAGNOSIS — D61818 Other pancytopenia: Secondary | ICD-10-CM | POA: Insufficient documentation

## 2016-11-18 DIAGNOSIS — Z9889 Other specified postprocedural states: Secondary | ICD-10-CM | POA: Insufficient documentation

## 2016-11-18 DIAGNOSIS — Z9049 Acquired absence of other specified parts of digestive tract: Secondary | ICD-10-CM | POA: Insufficient documentation

## 2016-11-18 DIAGNOSIS — I10 Essential (primary) hypertension: Secondary | ICD-10-CM | POA: Insufficient documentation

## 2016-11-18 DIAGNOSIS — E538 Deficiency of other specified B group vitamins: Secondary | ICD-10-CM

## 2016-11-18 DIAGNOSIS — D509 Iron deficiency anemia, unspecified: Secondary | ICD-10-CM

## 2016-11-18 DIAGNOSIS — M40209 Unspecified kyphosis, site unspecified: Secondary | ICD-10-CM | POA: Insufficient documentation

## 2016-11-18 DIAGNOSIS — C50912 Malignant neoplasm of unspecified site of left female breast: Secondary | ICD-10-CM | POA: Insufficient documentation

## 2016-11-18 DIAGNOSIS — M810 Age-related osteoporosis without current pathological fracture: Secondary | ICD-10-CM

## 2016-11-18 DIAGNOSIS — E079 Disorder of thyroid, unspecified: Secondary | ICD-10-CM | POA: Insufficient documentation

## 2016-11-18 DIAGNOSIS — Z170 Estrogen receptor positive status [ER+]: Secondary | ICD-10-CM | POA: Insufficient documentation

## 2016-11-18 MED ORDER — CYANOCOBALAMIN 1000 MCG/ML IJ SOLN
INTRAMUSCULAR | Status: AC
  Filled 2016-11-18: qty 1

## 2016-11-18 MED ORDER — CYANOCOBALAMIN 1000 MCG/ML IJ SOLN
1000.0000 ug | Freq: Once | INTRAMUSCULAR | Status: AC
  Administered 2016-11-18: 1000 ug via INTRAMUSCULAR

## 2016-11-18 NOTE — Patient Instructions (Signed)
[facility] at [facility] Discharge Instructions  RECOMMENDATIONS MADE BY THE CONSULTANT AND ANY TEST RESULTS WILL BE SENT TO YOUR REFERRING PHYSICIAN.  B12 injection today. Return as scheduled.   Thank you for choosing [facility] at [facility] to provide your oncology and hematology care.  To afford each patient quality time with our provider, please arrive at least 15 minutes before your scheduled appointment time.    If you have a lab appointment with the Cancer Center please come in thru the  Main Entrance and check in at the main information desk  You need to re-schedule your appointment should you arrive 10 or more minutes late.  We strive to give you quality time with our providers, and arriving late affects you and other patients whose appointments are after yours.  Also, if you no show three or more times for appointments you may be dismissed from the clinic at the providers discretion.     Again, thank you for choosing [facility].  Our hope is that these requests will decrease the amount of time that you wait before being seen by our physicians.       _____________________________________________________________  Should you have questions after your visit to [facility], please contact our office at [phone] between the hours of 8:30 a.m. and 4:30 p.m.  Voicemails left after 4:30 p.m. will not be returned until the following business day.  For prescription refill requests, have your pharmacy contact our office.       Resources For Cancer Patients and their Caregivers ? American Cancer Society: Can assist with transportation, wigs, general needs, runs Look Good Feel Better.        1-888-227-6333 ? Cancer Care: Provides financial assistance, online support groups, medication/co-pay assistance.  1-800-813-HOPE (4673) ? [facility] Assists [place] cancer patients and their  families through emotional , educational and financial support.  336-427-4357 ? [place] DSS Where to apply for food stamps, Medicaid and utility assistance. 336-342-1394 ? [employer]: Transportation to medical appointments. 336-347-2287 ? Social Security Administration: May apply for disability if have a Stage IV cancer. 336-342-7796 1-800-772-1213 ? [place] Aging, Disability and Transit Services: Assists with nutrition, care and transit needs. 336-349-2343  Cancer Center Support Programs: @10RELATIVEDAYS@ > Cancer Support Group  2nd Tuesday of the month 1pm-2pm, Journey Room  > Creative Journey  3rd Tuesday of the month 1130am-1pm, Journey Room  > Look Good Feel Better  1st Wednesday of the month 10am-12 noon, Journey Room (Call [employer] to register 1-800-395-5775)   

## 2016-11-18 NOTE — Progress Notes (Signed)
Ashley [name] presents today for injection per the provider's orders.  B12 administration without incident; see MAR for injection details.  Patient tolerated procedure well and without incident.  No questions or complaints noted at this time.  Discharged via wheelchair.

## 2016-11-19 DIAGNOSIS — R062 Wheezing: Secondary | ICD-10-CM | POA: Diagnosis not present

## 2016-11-19 DIAGNOSIS — R0602 Shortness of breath: Secondary | ICD-10-CM | POA: Diagnosis not present

## 2016-11-19 DIAGNOSIS — J9610 Chronic respiratory failure, unspecified whether with hypoxia or hypercapnia: Secondary | ICD-10-CM | POA: Diagnosis not present

## 2016-12-02 DIAGNOSIS — I10 Essential (primary) hypertension: Secondary | ICD-10-CM | POA: Diagnosis not present

## 2016-12-02 DIAGNOSIS — G894 Chronic pain syndrome: Secondary | ICD-10-CM | POA: Diagnosis not present

## 2016-12-02 DIAGNOSIS — E063 Autoimmune thyroiditis: Secondary | ICD-10-CM | POA: Diagnosis not present

## 2016-12-02 DIAGNOSIS — Z1389 Encounter for screening for other disorder: Secondary | ICD-10-CM | POA: Diagnosis not present

## 2016-12-02 DIAGNOSIS — Z6820 Body mass index (BMI) 20.0-20.9, adult: Secondary | ICD-10-CM | POA: Diagnosis not present

## 2016-12-02 DIAGNOSIS — K219 Gastro-esophageal reflux disease without esophagitis: Secondary | ICD-10-CM | POA: Diagnosis not present

## 2016-12-02 DIAGNOSIS — M2550 Pain in unspecified joint: Secondary | ICD-10-CM | POA: Diagnosis not present

## 2016-12-02 DIAGNOSIS — I872 Venous insufficiency (chronic) (peripheral): Secondary | ICD-10-CM | POA: Diagnosis not present

## 2016-12-18 ENCOUNTER — Encounter (HOSPITAL_BASED_OUTPATIENT_CLINIC_OR_DEPARTMENT_OTHER): Payer: Medicare Other

## 2016-12-18 ENCOUNTER — Encounter (HOSPITAL_COMMUNITY): Payer: Medicare Other | Attending: Oncology | Admitting: Oncology

## 2016-12-18 ENCOUNTER — Encounter (HOSPITAL_COMMUNITY): Payer: Self-pay | Admitting: Oncology

## 2016-12-18 ENCOUNTER — Encounter (HOSPITAL_COMMUNITY): Payer: Medicare Other

## 2016-12-18 VITALS — BP 91/48 | HR 68 | Temp 97.8°F | Resp 18 | Wt 100.6 lb

## 2016-12-18 DIAGNOSIS — M810 Age-related osteoporosis without current pathological fracture: Secondary | ICD-10-CM

## 2016-12-18 DIAGNOSIS — M40209 Unspecified kyphosis, site unspecified: Secondary | ICD-10-CM | POA: Diagnosis not present

## 2016-12-18 DIAGNOSIS — E079 Disorder of thyroid, unspecified: Secondary | ICD-10-CM | POA: Insufficient documentation

## 2016-12-18 DIAGNOSIS — Z170 Estrogen receptor positive status [ER+]: Secondary | ICD-10-CM | POA: Diagnosis not present

## 2016-12-18 DIAGNOSIS — C50912 Malignant neoplasm of unspecified site of left female breast: Secondary | ICD-10-CM

## 2016-12-18 DIAGNOSIS — Z853 Personal history of malignant neoplasm of breast: Secondary | ICD-10-CM | POA: Diagnosis not present

## 2016-12-18 DIAGNOSIS — L97929 Non-pressure chronic ulcer of unspecified part of left lower leg with unspecified severity: Secondary | ICD-10-CM | POA: Diagnosis not present

## 2016-12-18 DIAGNOSIS — D509 Iron deficiency anemia, unspecified: Secondary | ICD-10-CM

## 2016-12-18 DIAGNOSIS — E538 Deficiency of other specified B group vitamins: Secondary | ICD-10-CM

## 2016-12-18 DIAGNOSIS — Z9889 Other specified postprocedural states: Secondary | ICD-10-CM | POA: Diagnosis not present

## 2016-12-18 DIAGNOSIS — D61818 Other pancytopenia: Secondary | ICD-10-CM | POA: Insufficient documentation

## 2016-12-18 DIAGNOSIS — Z9049 Acquired absence of other specified parts of digestive tract: Secondary | ICD-10-CM | POA: Diagnosis not present

## 2016-12-18 DIAGNOSIS — S81802A Unspecified open wound, left lower leg, initial encounter: Secondary | ICD-10-CM

## 2016-12-18 DIAGNOSIS — I10 Essential (primary) hypertension: Secondary | ICD-10-CM | POA: Diagnosis not present

## 2016-12-18 LAB — CBC WITH DIFFERENTIAL/PLATELET
BASOS PCT: 0 %
Basophils Absolute: 0 10*3/uL (ref 0.0–0.1)
EOS ABS: 0.1 10*3/uL (ref 0.0–0.7)
EOS PCT: 1 %
HCT: 38.6 % (ref 36.0–46.0)
Hemoglobin: 11.9 g/dL — ABNORMAL LOW (ref 12.0–15.0)
Lymphocytes Relative: 17 %
Lymphs Abs: 1.1 10*3/uL (ref 0.7–4.0)
MCH: 27.8 pg (ref 26.0–34.0)
MCHC: 30.8 g/dL (ref 30.0–36.0)
MCV: 90.2 fL (ref 78.0–100.0)
MONO ABS: 0.4 10*3/uL (ref 0.1–1.0)
MONOS PCT: 6 %
Neutro Abs: 5.1 10*3/uL (ref 1.7–7.7)
Neutrophils Relative %: 76 %
Platelets: 142 10*3/uL — ABNORMAL LOW (ref 150–400)
RBC: 4.28 MIL/uL (ref 3.87–5.11)
RDW: 15.9 % — AB (ref 11.5–15.5)
WBC: 6.7 10*3/uL (ref 4.0–10.5)

## 2016-12-18 LAB — COMPREHENSIVE METABOLIC PANEL
ALBUMIN: 3.2 g/dL — AB (ref 3.5–5.0)
ALT: 12 U/L — ABNORMAL LOW (ref 14–54)
ANION GAP: 6 (ref 5–15)
AST: 18 U/L (ref 15–41)
Alkaline Phosphatase: 60 U/L (ref 38–126)
BUN: 21 mg/dL — ABNORMAL HIGH (ref 6–20)
CO2: 33 mmol/L — ABNORMAL HIGH (ref 22–32)
Calcium: 9.2 mg/dL (ref 8.9–10.3)
Chloride: 101 mmol/L (ref 101–111)
Creatinine, Ser: 0.94 mg/dL (ref 0.44–1.00)
GFR calc non Af Amer: 55 mL/min — ABNORMAL LOW (ref >60)
Glucose, Bld: 108 mg/dL — ABNORMAL HIGH (ref 65–99)
POTASSIUM: 4.7 mmol/L (ref 3.5–5.1)
SODIUM: 140 mmol/L (ref 135–145)
TOTAL PROTEIN: 6.2 g/dL — AB (ref 6.5–8.1)
Total Bilirubin: 0.9 mg/dL (ref 0.3–1.2)

## 2016-12-18 LAB — VITAMIN B12: VITAMIN B 12: 756 pg/mL (ref 180–914)

## 2016-12-18 LAB — IRON AND TIBC
Iron: 36 ug/dL (ref 28–170)
SATURATION RATIOS: 16 % (ref 10.4–31.8)
TIBC: 230 ug/dL — AB (ref 250–450)
UIBC: 194 ug/dL

## 2016-12-18 LAB — FERRITIN: FERRITIN: 101 ng/mL (ref 11–307)

## 2016-12-18 MED ORDER — CYANOCOBALAMIN 1000 MCG/ML IJ SOLN
INTRAMUSCULAR | Status: AC
  Filled 2016-12-18: qty 1

## 2016-12-18 MED ORDER — CYANOCOBALAMIN 1000 MCG/ML IJ SOLN
1000.0000 ug | Freq: Once | INTRAMUSCULAR | Status: AC
  Administered 2016-12-18: 1000 ug via INTRAMUSCULAR

## 2016-12-18 NOTE — Patient Instructions (Signed)
[place] at [facility] Discharge Instructions  RECOMMENDATIONS MADE BY THE CONSULTANT AND ANY TEST RESULTS WILL BE SENT TO YOUR REFERRING PHYSICIAN.  You received your Vitamin B-12 injection today Continue to get it monthly Follow up as scheduled.  Thank you for choosing [place] at [facility] to provide your oncology and hematology care.  To afford each patient quality time with our provider, please arrive at least 15 minutes before your scheduled appointment time.    If you have a lab appointment with the [place] please come in thru the  Main Entrance and check in at the main information desk  You need to re-schedule your appointment should you arrive 10 or more minutes late.  We strive to give you quality time with our providers, and arriving late affects you and other patients whose appointments are after yours.  Also, if you no show three or more times for appointments you may be dismissed from the clinic at the providers discretion.     Again, thank you for choosing [facility].  Our hope is that these requests will decrease the amount of time that you wait before being seen by our physicians.       _____________________________________________________________  Should you have questions after your visit to [facility], please contact our office at [phone] between the hours of 8:30 a.m. and 4:30 p.m.  Voicemails left after 4:30 p.m. will not be returned until the following business day.  For prescription refill requests, have your pharmacy contact our office.       Resources For Cancer Patients and their Caregivers ? American Cancer Society: Can assist with transportation, wigs, general needs, runs Look Good Feel Better.        224 629 9356 ? Cancer Care: Provides financial assistance, online support groups, medication/co-pay assistance.  1-800-813-HOPE 682-849-6922) ? [place] Assists [place] cancer patients and their families through emotional , educational and financial support.  (743)878-5046 ? [place] DSS Where to apply for food stamps, Medicaid and utility assistance. 772-443-1283 ? [employer]ATS: Transportation to medical appointments. 315-085-1515 ? Social Security Administration: May apply for disability if have a Stage IV cancer. 408-411-1073 657-556-4718 ? [employer], Disability and Transit Services: Assists with nutrition, care and transit needs. [place] Support Programs: @10RELATIVEDAYS @ > Cancer Support Group  2nd Tuesday of the month 1pm-2pm, Journey Room  > Creative Journey  3rd Tuesday of the month 1130am-1pm, Journey Room  > Look Good Feel Better  1st Wednesday of the month 10am-12 noon, Journey Room (Call [place] to register [phone])

## 2016-12-18 NOTE — Assessment & Plan Note (Signed)
Osteoporosis with severe kyphosis, on Ca++ and Vit D.  Managed by PCP.

## 2016-12-18 NOTE — Assessment & Plan Note (Signed)
Stage IB, grade 1, ER+ (90%), PR + (100%), HER2 negative, LEFT sided breast cancer, 8 mm in size.  S/P lumpectomy and sentinel lymph node biopsy (which was negative) on 04/24/2009, followed by Arimidex therapy which is completed.  I personally reviewed and went over radiographic studies with the patient.  The results are noted within this dictation.  I personally reviewed the images in PACS.  Mammogram in December 2017 was BIRADS 2.  She will be due for her next mammogram in December 2018.  Order is placed for mammogram in December 2018.  Return in 6 months for follow-up. 

## 2016-12-18 NOTE — Patient Instructions (Addendum)
[place] at [facility] Discharge Instructions  RECOMMENDATIONS MADE BY THE CONSULTANT AND ANY TEST RESULTS WILL BE SENT TO YOUR REFERRING PHYSICIAN.  You were seen today by Kirby Crigler PA-C. [facility] will be contacting you to look at your leg B12 monthly Labs in 6 months Return in 6 months for follow up   Thank you for choosing [place] at [facility] to provide your oncology and hematology care.  To afford each patient quality time with our provider, please arrive at least 15 minutes before your scheduled appointment time.    If you have a lab appointment with the [facility] please come in thru the  Main Entrance and check in at the main information desk  You need to re-schedule your appointment should you arrive 10 or more minutes late.  We strive to give you quality time with our providers, and arriving late affects you and other patients whose appointments are after yours.  Also, if you no show three or more times for appointments you may be dismissed from the clinic at the providers discretion.     Again, thank you for choosing [facility].  Our hope is that these requests will decrease the amount of time that you wait before being seen by our physicians.       _____________________________________________________________  Should you have questions after your visit to [facility], please contact our office at [phone] between the hours of 8:30 a.m. and 4:30 p.m.  Voicemails left after 4:30 p.m. will not be returned until the following business day.  For prescription refill requests, have your pharmacy contact our office.       Resources For Cancer Patients and their Caregivers ? American Cancer Society: Can assist with transportation, wigs, general needs, runs Look Good Feel Better.        769-776-7566 ? Cancer Care: Provides financial assistance, online support groups,  medication/co-pay assistance.  1-800-813-HOPE 671-623-5396) ? [place] Assists [place] cancer patients and their families through emotional , educational and financial support.  7734003305 ? [place] DSS Where to apply for food stamps, Medicaid and utility assistance. 418-117-0630 ? [employer]: Transportation to medical appointments. 847-259-6379 ? Social Security Administration: May apply for disability if have a Stage IV cancer. (430)402-6056 610-323-7234 ? [employer], Disability and Transit Services: Assists with nutrition, care and transit needs. [place] Support Programs: @10RELATIVEDAYS @ > Cancer Support Group  2nd Tuesday of the month 1pm-2pm, Journey Room  > Creative Journey  3rd Tuesday of the month 1130am-1pm, Journey Room  > Look Good Feel Better  1st Wednesday of the month 10am-12 noon, Journey Room (Call [place] to register [phone])

## 2016-12-18 NOTE — Assessment & Plan Note (Addendum)
B12 deficiency, suspicious for pernicious anemia with mildly elevated anti-parietal cell antibody and elevated gastrin level (but with normal intrinsic factor).  Continue B12 injection monthly.  Labs in 6 months: CBC diff, B12, and folate  Return in 6 months for follow-up

## 2016-12-18 NOTE — Progress Notes (Signed)
Clemon Chambers presents today for injection per MD orders. B-12 1000 mcg administered IM in right deltoid. Administration without incident. Patient tolerated well. Patient discharged in stable condition via wheelchair from clinic with family. Follow up as scheduled.

## 2016-12-18 NOTE — Progress Notes (Signed)
[employer], MD [address] [place] [address]  Malignant neoplasm of left breast in female, estrogen receptor positive, unspecified site of breast ([place])  B12 deficiency - Plan: CBC with Differential, Vitamin B12, Folate, Iron and TIBC, Ferritin  Age-related osteoporosis without current pathological fracture  Wound of left lower extremity, initial encounter - Plan: Ambulatory referral to [place]: Surveillance per NCCN guidelines.  B12 IM replacement therapy.  INTERVAL HISTORY: Ashley [name] 83 y.o. female returns for followup of Stage IB, grade 1, ER+ (90%), PR + (100%), HER2 negative, LEFT sided breast cancer, 8 mm in size.  S/P lumpectomy and sentinel lymph node biopsy (which was negative) on 04/24/2009, followed by Arimidex therapy which is completed. AND Osteoporosis, on Ca++ and Vit D. AND B12 deficiency, suspicious for pernicious anemia with mildly elevated anti-parietal cell antibody and elevated gastrin level (but with normal intrinsic factor).  HPI Elements   Location: Left breast  Quality: Invasive ductal carcinoma  Severity: Stage IB  Duration: Dx in 2010  Context: S/P lumpectomy on 04/24/2009  Timing: S/P 5 years worth of anti-endocrine therapy  Modifying Factors: Severe osteoporosis  Associated Signs & Symptoms:     She has a left leg ulcer that she described a fluid filled blister.  This opened the other day and drained clear liquid.  It continues to drain clear liquid as well.  She describes it as being at least 7 cm in size and "burns."  She denies any fevers or chills.  She has been in touch with Dr. Nolon Rod office.  No one has evaluate this area.  She does not want to unwrap her leg at this time.  She is interested in home health evaluating at home.  Review of Systems  Constitutional: Negative.  Negative for chills, fever and weight loss.  HENT: Negative.   Eyes: Negative.   Respiratory: Negative.  Negative  for cough.   Cardiovascular: Negative.  Negative for chest pain.  Gastrointestinal: Negative.  Negative for blood in stool, constipation, diarrhea, melena, nausea and vomiting.  Genitourinary: Negative.   Musculoskeletal: Negative.        Left leg blister with pain  Skin: Negative.   Neurological: Negative.  Negative for weakness.  Endo/Heme/Allergies: Negative.   Psychiatric/Behavioral: Negative.     Past Medical History:  Diagnosis Date  . Acid reflux   . Arthritis   . At risk for falls   . B12 deficiency 10/17/2016  . B12 deficiency 10/17/2016  . Back pain   . Bilateral ovarian cysts 02/20/2012  . Cancer ([place])   . Edema   . Hypertension   . Left-sided breast cancer 11/14/2011   Started Arimidex on 07/25/2009. Stage IB, grade 1, ER 99%, PR 100%, Her2 negative, left-sided breast cancer, 8 mm in size, S/P lumpectomy on 04/24/2010, and sentinel node biopsy.  Ki-67 marker 6%.  . Osteoporosis 03/06/2012   Switched from Arimidex to Tamoxifen as a result of osteoporosis   . Other pancytopenia ([place]) 05/29/2014  . Thyroid disease     Past Surgical History:  Procedure Laterality Date  . APPENDECTOMY    . BREAST SURGERY    . CHOLECYSTECTOMY    . HERNIA REPAIR    . HIP FRACTURE SURGERY    . TONSILLECTOMY    . TOTAL KNEE REVISION      History reviewed. No pertinent family history.  Social History   Social History  . Marital status: Divorced  Spouse name: N/A  . Number of children: N/A  . Years of education: N/A   Social History Main Topics  . Smoking status: Never Smoker  . Smokeless tobacco: Never Used  . Alcohol use No  . Drug use: No  . Sexual activity: Not Asked   Other Topics Concern  . None   Social History Narrative  . None     PHYSICAL EXAMINATION  ECOG PERFORMANCE STATUS: 2 - Symptomatic, <50% confined to bed  There were no vitals filed for this visit.  Vitals - 1 value per visit 1/44/8185  SYSTOLIC 91  DIASTOLIC 48  Pulse 68  Temperature 97.8    Respirations 18  Weight (lb) 100.6    GENERAL:alert, no distress, cachectic, smiling and in wheelchair, unaccompanied SKIN: skin color, texture, turgor are normal, positive for: left leg wrapped, wet. HEAD: Normocephalic, No masses, lesions, tenderness or abnormalities EYES: normal, EOMI, Conjunctiva are pink and non-injected EARS: External ears normal OROPHARYNX:lips, buccal mucosa, and tongue normal and mucous membranes are moist  NECK: supple, no adenopathy, trachea midline LYMPH:  no palpable lymphadenopathy BREAST:not examined LUNGS: clear to auscultation  HEART: regular rate & rhythm, no murmurs and no gallops ABDOMEN:abdomen soft and normal bowel sounds BACK: Back symmetric, no curvature. EXTREMITIES:less then 2 second capillary refill, no joint deformities, effusion, or inflammation, no skin discoloration, no cyanosis  NEURO: alert & oriented x 3 with fluent speech, no focal motor/sensory deficits, gait normal   LABORATORY DATA: CBC    Component Value Date/Time   WBC 6.7 12/18/2016 1234   RBC 4.28 12/18/2016 1234   HGB 11.9 (L) 12/18/2016 1234   HCT 38.6 12/18/2016 1234   HCT 31.0 (L) 03/03/2016 0627   PLT 142 (L) 12/18/2016 1234   MCV 90.2 12/18/2016 1234   MCH 27.8 12/18/2016 1234   MCHC 30.8 12/18/2016 1234   RDW 15.9 (H) 12/18/2016 1234   LYMPHSABS 1.1 12/18/2016 1234   MONOABS 0.4 12/18/2016 1234   EOSABS 0.1 12/18/2016 1234   BASOSABS 0.0 12/18/2016 1234      Chemistry      Component Value Date/Time   NA 140 12/18/2016 1234   K 4.7 12/18/2016 1234   CL 101 12/18/2016 1234   CO2 33 (H) 12/18/2016 1234   BUN 21 (H) 12/18/2016 1234   CREATININE 0.94 12/18/2016 1234      Component Value Date/Time   CALCIUM 9.2 12/18/2016 1234   ALKPHOS 60 12/18/2016 1234   AST 18 12/18/2016 1234   ALT 12 (L) 12/18/2016 1234   BILITOT 0.9 12/18/2016 1234        PENDING LABS:   RADIOGRAPHIC STUDIES:  No results found.   PATHOLOGY:    ASSESSMENT AND  PLAN:  Left-sided breast cancer Stage IB, grade 1, ER+ (90%), PR + (100%), HER2 negative, LEFT sided breast cancer, 8 mm in size.  S/P lumpectomy and sentinel lymph node biopsy (which was negative) on 04/24/2009, followed by Arimidex therapy which is completed.  I personally reviewed and went over radiographic studies with the patient.  The results are noted within this dictation.  I personally reviewed the images in PACS.  Mammogram in December 2017 was BIRADS 2.  She will be due for her next mammogram in December 2018.  Order is placed for mammogram in December 2018.  Return in 6 months for follow-up.  B12 deficiency B12 deficiency, suspicious for pernicious anemia with mildly elevated anti-parietal cell antibody and elevated gastrin level (but with normal intrinsic factor).  Continue B12  injection monthly.  Labs in 6 months: CBC diff, B12, and folate  Return in 6 months for follow-up  Osteoporosis Osteoporosis with severe kyphosis, on Ca++ and Vit D.  Managed by PCP.  ORDERS PLACED FOR THIS ENCOUNTER: Orders Placed This Encounter  Procedures  . CBC with Differential  . Vitamin B12  . Folate  . Iron and TIBC  . Ferritin  . Ambulatory referral to [place]: No orders of the defined types were placed in this encounter.   THERAPY PLAN:  NCCN guidelines recommends the following surveillance for invasive breast cancer (1.2018):  A. History and Physical exam 1-4 times per year as clinically appropriate for 5 years, then annually.  B. Periodic screening for changes in family history and referral to genetics counseling as indicated  C. Educate, monitor, and refer to lymphedema management.  D. Mammography every 12 months  E. Routine imaging of reconstructed breast is not indicated.  F. In the absence of clinical signs and symptoms suggestive of recurrent disease, there is no indication for laboratory or imaging studies for metastases  screening.  G. Women on Tamoxifen: annual gynecologic assessment every 12 months if uterus is present.  H. Women on aromatase inhibitor or who experience ovarian failure secondary to treatment should have monitoring of bone health with a bone mineral density determination at baseline and periodically thereafter.  I. Assess and encourage adherence to adjuvant endocrine therapy.  J. Evidence suggests that active lifestyle, healthy diet, limited alcohol intake, and achieving and maintaining an ideal body weight (20-25 BMI) may lead to optimal breast cancer outcomes.  All questions were answered. The patient knows to call the clinic with any problems, questions or concerns. We can certainly see the patient much sooner if necessary.  Patient and plan discussed with Dr. Twana First and she is in agreement with the aforementioned.   This note is electronically signed by: Doy Mince 12/18/2016 2:51 PM

## 2016-12-19 ENCOUNTER — Telehealth (HOSPITAL_COMMUNITY): Payer: Self-pay

## 2016-12-19 DIAGNOSIS — R0602 Shortness of breath: Secondary | ICD-10-CM | POA: Diagnosis not present

## 2016-12-19 DIAGNOSIS — R062 Wheezing: Secondary | ICD-10-CM | POA: Diagnosis not present

## 2016-12-19 DIAGNOSIS — J9610 Chronic respiratory failure, unspecified whether with hypoxia or hypercapnia: Secondary | ICD-10-CM | POA: Diagnosis not present

## 2016-12-19 NOTE — Telephone Encounter (Signed)
Patient called wanting to know who Gershon Mussel is sending out to check her legs. Reviewed with Gershon Mussel. He said he put in referral for [place] to see patient. Notified patient who verbalized understanding.

## 2016-12-22 DIAGNOSIS — Z170 Estrogen receptor positive status [ER+]: Secondary | ICD-10-CM | POA: Diagnosis not present

## 2016-12-22 DIAGNOSIS — Z9981 Dependence on supplemental oxygen: Secondary | ICD-10-CM | POA: Diagnosis not present

## 2016-12-22 DIAGNOSIS — C50912 Malignant neoplasm of unspecified site of left female breast: Secondary | ICD-10-CM | POA: Diagnosis not present

## 2016-12-22 DIAGNOSIS — M810 Age-related osteoporosis without current pathological fracture: Secondary | ICD-10-CM | POA: Diagnosis not present

## 2016-12-22 DIAGNOSIS — I10 Essential (primary) hypertension: Secondary | ICD-10-CM | POA: Diagnosis not present

## 2016-12-22 DIAGNOSIS — Z7982 Long term (current) use of aspirin: Secondary | ICD-10-CM | POA: Diagnosis not present

## 2016-12-22 DIAGNOSIS — E539 Vitamin B deficiency, unspecified: Secondary | ICD-10-CM | POA: Diagnosis not present

## 2016-12-22 DIAGNOSIS — L989 Disorder of the skin and subcutaneous tissue, unspecified: Secondary | ICD-10-CM | POA: Diagnosis not present

## 2016-12-23 DIAGNOSIS — L989 Disorder of the skin and subcutaneous tissue, unspecified: Secondary | ICD-10-CM | POA: Diagnosis not present

## 2016-12-24 DIAGNOSIS — L989 Disorder of the skin and subcutaneous tissue, unspecified: Secondary | ICD-10-CM | POA: Diagnosis not present

## 2016-12-24 DIAGNOSIS — C50912 Malignant neoplasm of unspecified site of left female breast: Secondary | ICD-10-CM | POA: Diagnosis not present

## 2016-12-24 DIAGNOSIS — Z170 Estrogen receptor positive status [ER+]: Secondary | ICD-10-CM | POA: Diagnosis not present

## 2016-12-24 DIAGNOSIS — M810 Age-related osteoporosis without current pathological fracture: Secondary | ICD-10-CM | POA: Diagnosis not present

## 2016-12-24 DIAGNOSIS — Z9981 Dependence on supplemental oxygen: Secondary | ICD-10-CM | POA: Diagnosis not present

## 2016-12-24 DIAGNOSIS — E539 Vitamin B deficiency, unspecified: Secondary | ICD-10-CM | POA: Diagnosis not present

## 2016-12-24 DIAGNOSIS — Z7982 Long term (current) use of aspirin: Secondary | ICD-10-CM | POA: Diagnosis not present

## 2016-12-24 DIAGNOSIS — I10 Essential (primary) hypertension: Secondary | ICD-10-CM | POA: Diagnosis not present

## 2016-12-27 DIAGNOSIS — Z7982 Long term (current) use of aspirin: Secondary | ICD-10-CM | POA: Diagnosis not present

## 2016-12-27 DIAGNOSIS — L989 Disorder of the skin and subcutaneous tissue, unspecified: Secondary | ICD-10-CM | POA: Diagnosis not present

## 2016-12-27 DIAGNOSIS — M810 Age-related osteoporosis without current pathological fracture: Secondary | ICD-10-CM | POA: Diagnosis not present

## 2016-12-27 DIAGNOSIS — C50912 Malignant neoplasm of unspecified site of left female breast: Secondary | ICD-10-CM | POA: Diagnosis not present

## 2016-12-27 DIAGNOSIS — Z170 Estrogen receptor positive status [ER+]: Secondary | ICD-10-CM | POA: Diagnosis not present

## 2016-12-27 DIAGNOSIS — Z9981 Dependence on supplemental oxygen: Secondary | ICD-10-CM | POA: Diagnosis not present

## 2016-12-27 DIAGNOSIS — I10 Essential (primary) hypertension: Secondary | ICD-10-CM | POA: Diagnosis not present

## 2016-12-27 DIAGNOSIS — E539 Vitamin B deficiency, unspecified: Secondary | ICD-10-CM | POA: Diagnosis not present

## 2016-12-30 DIAGNOSIS — Z9981 Dependence on supplemental oxygen: Secondary | ICD-10-CM | POA: Diagnosis not present

## 2016-12-30 DIAGNOSIS — C50912 Malignant neoplasm of unspecified site of left female breast: Secondary | ICD-10-CM | POA: Diagnosis not present

## 2016-12-30 DIAGNOSIS — I10 Essential (primary) hypertension: Secondary | ICD-10-CM | POA: Diagnosis not present

## 2016-12-30 DIAGNOSIS — Z7982 Long term (current) use of aspirin: Secondary | ICD-10-CM | POA: Diagnosis not present

## 2016-12-30 DIAGNOSIS — L989 Disorder of the skin and subcutaneous tissue, unspecified: Secondary | ICD-10-CM | POA: Diagnosis not present

## 2016-12-30 DIAGNOSIS — E539 Vitamin B deficiency, unspecified: Secondary | ICD-10-CM | POA: Diagnosis not present

## 2016-12-30 DIAGNOSIS — M810 Age-related osteoporosis without current pathological fracture: Secondary | ICD-10-CM | POA: Diagnosis not present

## 2016-12-30 DIAGNOSIS — Z170 Estrogen receptor positive status [ER+]: Secondary | ICD-10-CM | POA: Diagnosis not present

## 2017-01-01 DIAGNOSIS — L989 Disorder of the skin and subcutaneous tissue, unspecified: Secondary | ICD-10-CM | POA: Diagnosis not present

## 2017-01-02 ENCOUNTER — Emergency Department (HOSPITAL_COMMUNITY): Payer: Medicare Other

## 2017-01-02 ENCOUNTER — Encounter (HOSPITAL_COMMUNITY): Payer: Self-pay | Admitting: Emergency Medicine

## 2017-01-02 ENCOUNTER — Observation Stay (HOSPITAL_COMMUNITY)
Admission: EM | Admit: 2017-01-02 | Discharge: 2017-01-03 | Disposition: A | Discharge status: Home / Self Care | Payer: Medicare Other | Attending: Internal Medicine | Admitting: Internal Medicine

## 2017-01-02 DIAGNOSIS — R4182 Altered mental status, unspecified: Secondary | ICD-10-CM | POA: Diagnosis not present

## 2017-01-02 DIAGNOSIS — L0390 Cellulitis, unspecified: Secondary | ICD-10-CM | POA: Diagnosis present

## 2017-01-02 DIAGNOSIS — Z7982 Long term (current) use of aspirin: Secondary | ICD-10-CM | POA: Diagnosis not present

## 2017-01-02 DIAGNOSIS — R410 Disorientation, unspecified: Secondary | ICD-10-CM | POA: Diagnosis present

## 2017-01-02 DIAGNOSIS — L03116 Cellulitis of left lower limb: Secondary | ICD-10-CM | POA: Insufficient documentation

## 2017-01-02 DIAGNOSIS — R441 Visual hallucinations: Secondary | ICD-10-CM | POA: Diagnosis not present

## 2017-01-02 DIAGNOSIS — I10 Essential (primary) hypertension: Secondary | ICD-10-CM | POA: Diagnosis present

## 2017-01-02 DIAGNOSIS — R17 Unspecified jaundice: Secondary | ICD-10-CM

## 2017-01-02 DIAGNOSIS — Z79899 Other long term (current) drug therapy: Secondary | ICD-10-CM | POA: Diagnosis not present

## 2017-01-02 DIAGNOSIS — E538 Deficiency of other specified B group vitamins: Secondary | ICD-10-CM | POA: Diagnosis present

## 2017-01-02 DIAGNOSIS — Z853 Personal history of malignant neoplasm of breast: Secondary | ICD-10-CM | POA: Diagnosis not present

## 2017-01-02 DIAGNOSIS — E039 Hypothyroidism, unspecified: Secondary | ICD-10-CM | POA: Diagnosis present

## 2017-01-02 DIAGNOSIS — J9811 Atelectasis: Secondary | ICD-10-CM | POA: Diagnosis not present

## 2017-01-02 LAB — URINALYSIS, ROUTINE W REFLEX MICROSCOPIC
Bilirubin Urine: NEGATIVE
GLUCOSE, UA: NEGATIVE mg/dL
Hgb urine dipstick: NEGATIVE
KETONES UR: 5 mg/dL — AB
LEUKOCYTES UA: NEGATIVE
NITRITE: NEGATIVE
PH: 5 (ref 5.0–8.0)
Protein, ur: NEGATIVE mg/dL
Specific Gravity, Urine: 1.017 (ref 1.005–1.030)

## 2017-01-02 LAB — COMPREHENSIVE METABOLIC PANEL
ALBUMIN: 3.3 g/dL — AB (ref 3.5–5.0)
ALT: 12 U/L — AB (ref 14–54)
AST: 21 U/L (ref 15–41)
Alkaline Phosphatase: 71 U/L (ref 38–126)
Anion gap: 7 (ref 5–15)
BUN: 21 mg/dL — AB (ref 6–20)
CHLORIDE: 101 mmol/L (ref 101–111)
CO2: 31 mmol/L (ref 22–32)
CREATININE: 0.85 mg/dL (ref 0.44–1.00)
Calcium: 9.8 mg/dL (ref 8.9–10.3)
GFR calc Af Amer: >60 mL/min (ref >60)
GFR calc non Af Amer: >60 mL/min (ref >60)
GLUCOSE: 119 mg/dL — AB (ref 65–99)
POTASSIUM: 4.6 mmol/L (ref 3.5–5.1)
Sodium: 139 mmol/L (ref 135–145)
Total Bilirubin: 1.9 mg/dL — ABNORMAL HIGH (ref 0.3–1.2)
Total Protein: 7 g/dL (ref 6.5–8.1)

## 2017-01-02 LAB — CBC WITH DIFFERENTIAL/PLATELET
BASOS ABS: 0 10*3/uL (ref 0.0–0.1)
BASOS PCT: 0 %
Eosinophils Absolute: 0 10*3/uL (ref 0.0–0.7)
Eosinophils Relative: 0 %
HEMATOCRIT: 38.7 % (ref 36.0–46.0)
Hemoglobin: 12.2 g/dL (ref 12.0–15.0)
Lymphocytes Relative: 10 %
Lymphs Abs: 0.9 10*3/uL (ref 0.7–4.0)
MCH: 28 pg (ref 26.0–34.0)
MCHC: 31.5 g/dL (ref 30.0–36.0)
MCV: 89 fL (ref 78.0–100.0)
Monocytes Absolute: 0.6 10*3/uL (ref 0.1–1.0)
Monocytes Relative: 7 %
Neutro Abs: 7.6 10*3/uL (ref 1.7–7.7)
Neutrophils Relative %: 83 %
Platelets: 184 10*3/uL (ref 150–400)
RBC: 4.35 MIL/uL (ref 3.87–5.11)
RDW: 16.4 % — AB (ref 11.5–15.5)
WBC: 9.1 10*3/uL (ref 4.0–10.5)

## 2017-01-02 LAB — I-STAT CG4 LACTIC ACID, ED: Lactic Acid, Venous: 1.43 mmol/L (ref 0.5–1.9)

## 2017-01-02 LAB — TSH: TSH: 1.323 u[IU]/mL (ref 0.350–4.500)

## 2017-01-02 MED ORDER — VANCOMYCIN HCL IN DEXTROSE 1-5 GM/200ML-% IV SOLN
1000.0000 mg | Freq: Once | INTRAVENOUS | Status: AC
  Administered 2017-01-02: 1000 mg via INTRAVENOUS
  Filled 2017-01-02: qty 200

## 2017-01-02 MED ORDER — ASPIRIN EC 81 MG PO TBEC
81.0000 mg | DELAYED_RELEASE_TABLET | Freq: Every day | ORAL | Status: DC
  Administered 2017-01-02 – 2017-01-03 (×2): 81 mg via ORAL
  Filled 2017-01-02 (×2): qty 1

## 2017-01-02 MED ORDER — PANTOPRAZOLE SODIUM 40 MG PO TBEC
40.0000 mg | DELAYED_RELEASE_TABLET | Freq: Every day | ORAL | Status: DC
  Administered 2017-01-02 – 2017-01-03 (×2): 40 mg via ORAL
  Filled 2017-01-02 (×2): qty 1

## 2017-01-02 MED ORDER — LEVOTHYROXINE SODIUM 75 MCG PO TABS
75.0000 ug | ORAL_TABLET | Freq: Every day | ORAL | Status: DC
  Administered 2017-01-02 – 2017-01-03 (×2): 75 ug via ORAL
  Filled 2017-01-02 (×2): qty 1

## 2017-01-02 MED ORDER — NYSTATIN 100000 UNIT/GM EX POWD
Freq: Three times a day (TID) | CUTANEOUS | Status: DC
  Administered 2017-01-02 – 2017-01-03 (×3): via TOPICAL
  Filled 2017-01-02 (×2): qty 15

## 2017-01-02 MED ORDER — ENSURE ENLIVE PO LIQD
237.0000 mL | Freq: Two times a day (BID) | ORAL | Status: DC
  Administered 2017-01-02 – 2017-01-03 (×3): 237 mL via ORAL

## 2017-01-02 MED ORDER — HEPARIN SODIUM (PORCINE) 5000 UNIT/ML IJ SOLN
5000.0000 [IU] | Freq: Three times a day (TID) | INTRAMUSCULAR | Status: DC
  Administered 2017-01-02 – 2017-01-03 (×4): 5000 [IU] via SUBCUTANEOUS
  Filled 2017-01-02 (×4): qty 1

## 2017-01-02 MED ORDER — METOPROLOL SUCCINATE ER 25 MG PO TB24
25.0000 mg | ORAL_TABLET | Freq: Every day | ORAL | Status: DC
  Administered 2017-01-03: 25 mg via ORAL
  Filled 2017-01-02 (×2): qty 1

## 2017-01-02 MED ORDER — VANCOMYCIN HCL IN DEXTROSE 1-5 GM/200ML-% IV SOLN: 1000.0000 mg | INTRAVENOUS | Status: DC

## 2017-01-02 MED ORDER — SODIUM CHLORIDE 0.9% FLUSH
3.0000 mL | Freq: Two times a day (BID) | INTRAVENOUS | Status: DC
  Administered 2017-01-02 (×2): 3 mL via INTRAVENOUS

## 2017-01-02 MED ORDER — ACETAMINOPHEN 325 MG PO TABS
650.0000 mg | ORAL_TABLET | Freq: Four times a day (QID) | ORAL | Status: DC | PRN
  Administered 2017-01-02: 650 mg via ORAL
  Administered 2017-01-03 (×2): 325 mg via ORAL
  Filled 2017-01-02 (×3): qty 2

## 2017-01-02 NOTE — Progress Notes (Addendum)
Initial Nutrition Assessment  DOCUMENTATION CODES:  Severe malnutrition in context of social or environmental circumstances  INTERVENTION:  Continue Ensure Enlive po BID, each supplement provides 350 kcal and 20 grams of protein  RD's assessment is that patient does NOT seem able maintain her nutrition at home. Fear that she will continue to decline if returns home, especially given her wound. However, she greatly values her independence and feels she is doing fine on her own-states she thinks shell live to 100. Will likely refuse placement.   Sensed patient masking deficits, as would only give very vague, generalized responses to questions.   NUTRITION DIAGNOSIS:  Malnutrition related to lethargy/confusion, social / environmental circumstances(lack of assistance), fraility, possible cognitive deficits, inabilty to perform ADLs as evidenced by loss of 9.7% bw in 4 months and severe Muscle/fat loss  GOAL:  Patient will meet greater than or equal to 90% of their needs  MONITOR:  PO intake, Supplement acceptance, Labs, Skin, Disposition  REASON FOR ASSESSMENT:  Consult Assessment of nutrition requirement/status + Poor PO intake  ASSESSMENT:  83 y/o female PMHx B12 def (gets monthly injections)  GERD, anxiety, HTN, remote Breast cancer. Presented via EMS after having tactile/auditory hallucinations and seeing people in her house who werent there. Work up revealed cellulitis. Delirium thought r/t anxiety/pain medication vs toxic metabolic encephalopathy.  Pt lives alone and therefore admitted under observation. RD consulted due to concerns of patient being unable to take care of herself nutritionally.   Per CM note, pts neighbors relayed that pt has had poor intake x1 month & relies onothers for assistance. One of these individuals is Currently in SNF after breaking bone and unable to help patient, which has led to acute decline in patients health. Neighbors felt pt would be best served living  in a facility  Pt seen asleep in bed with fall mats in place, visibly much more cachectic than what her BMI would suggest. She is very vague with her responses and does not answer questions directly. Could not give any specifics. Kept referencing how well she was doing on her own.  RD asked about how she was eating at home, though pt ket confusing this with how eating in hospital.  Took much redirection to determine PTA hx. She says she "is killing it" at home. However, she "tries not to overdue it, because I know what that will do to you" and she "tries to be nutritious". Upon further probing, it sounds that she just eats small, easily prepared items around the house, not meals. She says she will have cereal frequently.   RD asked about ability to obtain food. She says she CAN shop for herself, when "she feels like she is able to", if her friend drives her to the store. She says she cant do this every day, but is able to manage.  She reports being told to drink Ensure, but has not gotten it yet. When asked about vitamins, she says she takes them, but not every day. When asked about specific vitamins she could not name any; says she "has everything at her house". Has had diarrhea felt r/t ABx  RD broached his concern about her being able to maintain her nutrition and take care of herself at home, especially with wound. She became defensive and said "I can do nothing at home if I want to, that's my right".   Per review of chart, the patient had been maintaining her weight from 107-110 for the past year, up until this  past April. She has lost 10 lbs since then, which is a loss of 9.3% bw x 4 months. She has significant edema in R lower extremity and true weight loss in likely greater than this.  Physical Exam reveals  severe fat/muscle wasting in nearly all areas examined. Does have some palpable muscle to L quad. R leg is very edematous. She comments "Bones" when rd performed physical exam  At this  time, the patient says her appetite is good. She has eaten 75% of only meal documented. Continue Ensure Enlive BID. She has no complaints with the food. She asks when she gets to go home.   Labs: Albumin: 3.3, Glu:119 Meds: Xanax, Ensure Enlive, Nystatin, PPI, IV Abx    Recent Labs Lab 01/02/17 0210  NA 139  K 4.6  CL 101  CO2 31  BUN 21*  CREATININE 0.85  CALCIUM 9.8  GLUCOSE 119*   Diet Order:  DIET SOFT Room service appropriate? Yes; Fluid consistency: Thin  Skin: Cellulitis to Left Leg. Rash to abdominal folds  Last BM:  8/8  Height:  Ht Readings from Last 1 Encounters:  01/02/17 4\' 4"  (1.321 m)  Suspect she is much taller given BMI vs her physical appearance  Weight:  Wt Readings from Last 1 Encounters:  01/02/17 97 lb 2 oz (44.1 kg)   Wt Readings from Last 10 Encounters:  01/02/17 97 lb 2 oz (44.1 kg)  12/18/16 100 lb 9.6 oz (45.6 kg)  09/09/16 107 lb 6.4 oz (48.7 kg)  08/09/16 105 lb (47.6 kg)  03/06/16 107 lb (48.5 kg)  03/05/16 109 lb 9.1 oz (49.7 kg)  01/17/16 108 lb 14.4 oz (49.4 kg)  07/26/15 108 lb (49 kg)  07/18/15 109 lb (49.4 kg)  05/26/15 117 lb 1.6 oz (53.1 kg)   Ideal Body Weight:  39.4 kg  BMI:  Body mass index is 25.25 kg/m.  Estimated Nutritional Needs:  Kcal:  1100-1300 kcals (25-30kcal/kg bw) Protein:  49-62g Pro (1.1-1.4g/kg bw) Fluid:  >1.1 L fluid (25 ml/kcal)  EDUCATION NEEDS:  No education needs identified at this time  Burtis Junes RD, LDN, [place] Nutrition Pager: [phone] 01/02/2017 3:44 PM

## 2017-01-02 NOTE — ED Triage Notes (Signed)
Pt called the police due to people being in her house. Per ems pt was having visual, visual, and tactile hallucinations tonight.

## 2017-01-02 NOTE — ED Provider Notes (Signed)
[facility] Provider Note   CSN: [id number] Arrival date & time: 01/02/17  0105     History   Chief Complaint Chief Complaint  Patient presents with  . Hallucinations    HPI Ashley [name] is a 83 y.o. female.  The history is provided by the patient. The history is limited by the condition of the patient (Altered mental status).  she was brought in by EMS because she called police stating there were people in her house. There was no one in her house. EMS reports patient having visual, auditory, and tactile hallucinations. Patient does admit to seeing people who weren't really there. However, when auditory and tactile hallucinations were discussed, patient goes off in tangents and never answers to questions. It is not clear how long his symptoms have been present. Of note, she is being treated for venous stasis ulcers of her left leg. She apparently had been prescribed cephalexin, but stopped taking it because of diarrhea. She denies being aware of any fever, chills, sweats. She has had no new medications.  Past Medical History:  Diagnosis Date  . Acid reflux   . Arthritis   . At risk for falls   . B12 deficiency 10/17/2016  . B12 deficiency 10/17/2016  . Back pain   . Bilateral ovarian cysts 02/20/2012  . Cancer ([place])   . Edema   . Hypertension   . Left-sided breast cancer 11/14/2011   Started Arimidex on 07/25/2009. Stage IB, grade 1, ER 99%, PR 100%, Her2 negative, left-sided breast cancer, 8 mm in size, S/P lumpectomy on 04/24/2010, and sentinel node biopsy.  Ki-67 marker 6%.  . Osteoporosis 03/06/2012   Switched from Arimidex to Tamoxifen as a result of osteoporosis   . Other pancytopenia ([place]) 05/29/2014  . Thyroid disease     Patient Active Problem List   Diagnosis Date Noted  . B12 deficiency 10/17/2016  . Multiple rib fractures 03/03/2016  . Multiple closed fractures of ribs of left side   . Closed nondisplaced fracture of surgical neck of left humerus   . Rib  fractures 03/02/2016  . Hypoxia 03/02/2016  . Humerus shaft fracture 03/02/2016  . Fall as cause of accidental injury at home as place of occurrence 03/02/2016  . Iron deficiency anemia 07/08/2014  . Osteoporosis 03/06/2012  . Bilateral ovarian cysts 02/20/2012  . Left-sided breast cancer 11/14/2011  . Hypothyroidism 03/10/2007  . Essential hypertension 03/10/2007  . GERD 03/10/2007    Past Surgical History:  Procedure Laterality Date  . APPENDECTOMY    . BREAST SURGERY    . CHOLECYSTECTOMY    . HERNIA REPAIR    . HIP FRACTURE SURGERY    . TONSILLECTOMY    . TOTAL KNEE REVISION      OB History    No data available       Home Medications    Prior to Admission medications   Medication Sig Start Date End Date Taking? Authorizing Provider  ALPRAZolam Duanne Moron) 0.5 MG tablet Take 0.5-1 tablets (0.25-0.5 mg total) by mouth 3 (three) times daily as needed for anxiety. 03/05/16   Thurnell Lose, MD  aspirin EC 81 MG tablet Take 81 mg by mouth daily with lunch.    [provider]  Cholecalciferol 2000 units TABS Take 1 tablet by mouth daily.    [provider]  ferrous sulfate 325 (65 FE) MG tablet Take 1 tablet (325 mg total) by mouth daily. 03/06/16   Dorie Rank, MD  levothyroxine ([place], [place])  75 MCG tablet Take 75 mcg by mouth daily before breakfast.    [provider]  metoprolol succinate (TOPROL-XL) 25 MG 24 hr tablet Take 1 tablet (25 mg total) by mouth daily. 03/05/16   Thurnell Lose, MD  nitroGLYCERIN (NITROSTAT) 0.4 MG SL tablet Place 0.4 mg under the tongue every 5 (five) minutes as needed for chest pain.     [provider]  omeprazole (PRILOSEC) 20 MG capsule Take 20 mg by mouth daily.     [provider]  potassium chloride SA (K-DUR,KLOR-CON) 20 MEQ tablet Take 20 mEq by mouth daily.    [provider]  Silver Sulfadiazine (SILVADENE EX) Apply topically 3 times/day as needed-between meals & bedtime.     [provider]  silver sulfADIAZINE (SILVADENE) 1 % cream APPLY TO AFFECTED AREAS THREE TIMES DAILY. 10/11/16   Florian Buff, MD  triamcinolone cream (KENALOG) 0.1 % Apply 1 application topically 2 (two) times daily. Pt applies to legs.    [provider]    Family History No family history on file.  Social History Social History  Substance Use Topics  . Smoking status: Never Smoker  . Smokeless tobacco: Never Used  . Alcohol use No     Allergies   Ciprofloxacin; Codeine phosphate; Doxycycline; Erythromycin ethylsuccinate; Levofloxacin; Penicillins; Sulfamethoxazole; Tetracycline hcl; and Lidocaine   Review of Systems Review of Systems  Unable to perform ROS: Mental status change     Physical Exam Updated Vital Signs BP 120/71   Pulse 77   Temp 97.8 F (36.6 C)   Resp 18   Wt 45.4 kg (100 lb)   SpO2 96%   BMI 26.00 kg/m   Physical Exam  Nursing note and vitals reviewed.  83 year old female, resting comfortably and in no acute distress. Vital signs are normal. Oxygen saturation is 96%, which is normal. Head is normocephalic and atraumatic. PERRLA, EOMI. Oropharynx is clear. Neck is nontender and supple without adenopathy or JVD. Back is nontender and there is no CVA tenderness. Lungs are clear without rales, wheezes, or rhonchi. Chest is nontender. Heart has regular rate and rhythm without murmur. Abdomen is soft, flat, nontender without masses or hepatosplenomegaly and peristalsis is normoactive. Extremities have 1+ edema. Moderate venous stasis changes are present bilaterally. Left calf has erythema, and a rather diffuse area of desquamation posteriorly. Circumference is symmetric as is 5 circumference. Dorsalis pedis pulses are strong. Capillary refill is prompt. Skin is warm and dry without other rash. Neurologic: she is alert and oriented to person and place, cranial nerves are intact, there are no motor or sensory deficits.  ED  Treatments / Results  Labs (all labs ordered are listed, but only abnormal results are displayed) Labs Reviewed  COMPREHENSIVE METABOLIC PANEL - Abnormal; Notable for the following:       Result Value   Glucose, Bld 119 (*)    BUN 21 (*)    Albumin 3.3 (*)    ALT 12 (*)    Total Bilirubin 1.9 (*)    All other components within normal limits  CBC WITH DIFFERENTIAL/PLATELET - Abnormal; Notable for the following:    RDW 16.4 (*)    All other components within normal limits  URINALYSIS, ROUTINE W REFLEX MICROSCOPIC - Abnormal; Notable for the following:    Ketones, ur 5 (*)    All other components within normal limits  CULTURE, BLOOD (ROUTINE X 2)  CULTURE, BLOOD (ROUTINE X 2)  I-STAT CG4 LACTIC ACID,  ED    Radiology Dg Chest 2 View  Result Date: 01/02/2017 CLINICAL DATA:  Acute onset of hallucinations.  Initial encounter. EXAM: CHEST  2 VIEW COMPARISON:  Chest radiograph performed 03/06/2016 FINDINGS: The lungs are hypoexpanded. Peribronchial thickening is noted. A large hiatal hernia is noted, with surrounding atelectasis. No pleural effusion or pneumothorax is seen. The cardiomediastinal silhouette is mildly enlarged. No acute osseous abnormalities are seen. There is mild chronic deformity of the proximal left humerus. Clips are noted within the right upper quadrant, reflecting prior cholecystectomy. IMPRESSION: 1. Lungs hypoexpanded.  Peribronchial thickening. 2. Large hiatal hernia, with surrounding atelectasis. 3. Mild cardiomegaly. Electronically Signed   By: Garald Balding M.D.   On: 01/02/2017 02:50   Ct Head Wo Contrast  Result Date: 01/02/2017 CLINICAL DATA:  Visual and tactile hallucinations. Altered level of consciousness. EXAM: CT HEAD WITHOUT CONTRAST TECHNIQUE: Contiguous axial images were obtained from the base of the skull through the vertex without intravenous contrast. COMPARISON:  None. FINDINGS: Brain: Mild-to-moderate small vessel ischemic disease of periventricular  white matter. No acute intracranial hemorrhage, midline shift or edema. No intra-axial mass nor extra-axial fluid collections. Superficial and central atrophy. Vascular: Mild-to-moderate carotid siphon calcifications. No hyperdense vessels. Skull: Negative for fracture or suspicious osseous lesions. Sinuses/Orbits: No acute finding. Small right ethmoid osteoma. Right lens surgery. Other: None IMPRESSION: Atrophy with chronic appearing small vessel ischemic disease of periventricular white matter. No acute intracranial abnormality. Electronically Signed   By: Ashley Royalty M.D.   On: 01/02/2017 02:37    Procedures Procedures (including critical care time)  Medications Ordered in ED Medications - No data to display   Initial Impression / Assessment and Plan / ED Course  I have reviewed the triage vital signs and the nursing notes.  Pertinent labs & imaging results that were available during my care of the patient were reviewed by me and considered in my medical decision making (see chart for details).  Hallucinations of uncertain cause. Suspect response to infection. She does clinically have cellulitis of her leg. She apparently had been prescribed cephalexin which he stopped because of diarrhea. Review of her medications shows the only thing which is likely to have psychoactive side effects is alprazolam, but she is on a very low dose. Screening labs are obtained including a blood culture and urinalysis and lactic acid. We'll check CT of head and plain films of chest. Old records are reviewed, and she has no relevant past visits.  Laboratory workup shows normal WBC, but with slight left shift. Bilirubin is mildly elevated of doubtful clinical significance-possible Gilbert's Disease. CT of head is unremarkable. Urinalysis is normal.At this point, mental status changes felt to be a toxic encephalopathy secondary to cellulitis. She is started on vancomycin. Case is discussed with Dr. Marin Comment [facility], who agrees to admit the patient.  Final Clinical Impressions(s) / ED Diagnoses   Final diagnoses:  Cellulitis of left lower leg  Visual hallucinations  Total bilirubin, elevated    New Prescriptions New Prescriptions   No medications on file     Delora Fuel, MD 40/76/80 [phone]

## 2017-01-02 NOTE — Care Management Note (Signed)
Case Management Note  Patient Details  Name: Ashley [name] MRN: [id number] Date of Birth: 1931-08-04  Subjective/Objective:                  Admitted with delirium and cellulitis. Pt is alert and oriented to person, place, year and president, states she lives alone, uses cane bilaterally to ambulate. She has sister and brother who both live out of town but no family near-by. She has neighbors who help and give me permission to contact them. She says a lady was helping her and fell, broke something and is now at the [facility]. CM reports no other assistance. CM contacted her neighbors Eustaquio Maize and [employer]. Beth verifies pt has no family near-by, brother and sister can be called but are not always helpful or supportive. Pt did have a lady she paid to help with shopping, housework and dressing changes - this lady is now at the [facility]. Beth has stepped in and feeds pt's dogs, helps with transportation to appointments and shopping. Reports pt has not been eating or drinking well for the last month. [facility] nursing is coming for dressing changes. Per [facility], pt "should probably be living in a facility" but refuses to do so.   Action/Plan: [facility] rep, aware of admission. PT eval pending and dietician consult placed to evaluate nutritional status. Will cont to follow.   Expected Discharge Date:     01/03/2017             Expected Discharge Plan:  [place]  In-House Referral:  Nutrition  Discharge planning Services  CM Consult  Post Acute Care Choice:  Home Health, Resumption of Svcs/PTA Provider Choice offered to:  Patient  HH Arranged:  RN, PT, Nurse's Aide, Social Work [employer] Agency:  [place]  Status of Service:  In process, will continue to follow  Sherald Barge, RN 01/02/2017, 1:19 PM

## 2017-01-02 NOTE — Care Management Obs Status (Signed)
[facility] NOTIFICATION   Patient Details  Name: Ashley [name] MRN: [id number] Date of Birth: 20-Oct-1931   Medicare Observation Status Notification Given:  Yes    Sherald Barge, RN 01/02/2017, 1:08 PM

## 2017-01-02 NOTE — H&P (Signed)
History and Physical    Ashley [name] CHY:[id number] DOB: Feb 21, 1932 DOA: 01/02/2017  PCP: Redmond School, MD  Patient coming from: Home.    Chief Complaint: Hallucination.   HPI: Ashley [name] is an 83 y.o. female lives alone at home with hx of B12 deficiency, getting monthly B12 shots, anxiety on benzodiazepines, and pain on Hydrocodone, hx of hypothyroidism, GERD, breast CA, called the fire department several times, as she was having hallucination, seeing people in her house that was not there.  She has been taking her pain pills as well.  She has episode of lucidity, and knows her name and that she is at [facility].  She told me her self that she has been on Hydrocodone.  She denied fever, chills, HA, new drug, Vx problem, or any focal neurological symptoms.  Evaluation in the ER Included a negative head CT, normal WBC, renal Fx tests, and unremarkable CBG.  She was found to have a cellulitis of her left lower extremity and was started on her Vancomycin IV.  Due to her living alone, and being confused with hallucination, hospitalist was asked to admit her OBS for delirium and to Tx her with IV antibiotics.   ED Course:  See above.  Rewiew of Systems:  Constitutional: Negative for malaise, fever and chills. No significant weight loss or weight gain Eyes: Negative for eye pain, redness and discharge, diplopia, visual changes, or flashes of light. ENMT: Negative for ear pain, hoarseness, nasal congestion, sinus pressure and sore throat. No headaches; tinnitus, drooling, or problem swallowing. Cardiovascular: Negative for chest pain, palpitations, diaphoresis, dyspnea and peripheral edema. ; No orthopnea, PND Respiratory: Negative for cough, hemoptysis, wheezing and stridor. No pleuritic chestpain. Gastrointestinal: Negative for diarrhea, constipation,  melena, blood in stool, hematemesis, jaundice and rectal bleeding.    Genitourinary: Negative for frequency, dysuria,  incontinence,flank pain and hematuria; Musculoskeletal: Negative for back pain and neck pain. Negative for swelling and trauma.;  Skin: . Negative for pruritus, rash, abrasions, bruising and skin lesion.; ulcerations Neuro: Negative for headache, lightheadedness and neck stiffness. Negative for weakness, altered level of consciousness , altered mental status, extremity weakness, burning feet, involuntary movement, seizure and syncope.  Psych: negative for anxiety, depression, insomnia, tearfulness, panic attacks, hallucinations, paranoia, suicidal or homicidal ideation    Past Medical History:  Diagnosis Date  . Acid reflux   . Arthritis   . At risk for falls   . B12 deficiency 10/17/2016  . B12 deficiency 10/17/2016  . Back pain   . Bilateral ovarian cysts 02/20/2012  . Cancer ([place])   . Edema   . Hypertension   . Left-sided breast cancer 11/14/2011   Started Arimidex on 07/25/2009. Stage IB, grade 1, ER 99%, PR 100%, Her2 negative, left-sided breast cancer, 8 mm in size, S/P lumpectomy on 04/24/2010, and sentinel node biopsy.  Ki-67 marker 6%.  . Osteoporosis 03/06/2012   Switched from Arimidex to Tamoxifen as a result of osteoporosis   . Other pancytopenia ([place]) 05/29/2014  . Thyroid disease     Past Surgical History:  Procedure Laterality Date  . APPENDECTOMY    . BREAST SURGERY    . CHOLECYSTECTOMY    . HERNIA REPAIR    . HIP FRACTURE SURGERY    . TONSILLECTOMY    . TOTAL KNEE REVISION       reports that she has never smoked. She has never used smokeless tobacco. She reports that she does not drink alcohol or use drugs.  Allergies  Allergen Reactions  . Ciprofloxacin Hives and Itching  . Codeine Phosphate Other (See Comments)    Reaction:  Unknown   . Doxycycline Hives and Itching  . Erythromycin Ethylsuccinate Other (See Comments)    Reaction:  Unknown   . Levofloxacin Other (See Comments)    Reaction:  Unknown   . Penicillins Other (See Comments)    Reaction:  Unknown   Has patient had a PCN reaction causing immediate rash, facial/tongue/throat swelling, SOB or lightheadedness with hypotension: Unsure Has patient had a PCN reaction causing severe rash involving mucus membranes or skin necrosis: Unsure Has patient had a PCN reaction that required hospitalization Unsure Has patient had a PCN reaction occurring within the last 10 years: No If all of the above answers are "NO", then may proceed with Cephalosporin use.  . Sulfamethoxazole Other (See Comments)    Reaction:  Unknown   . Tetracycline Hcl Other (See Comments)    Reaction:  Unknown   . Lidocaine Other (See Comments)    Reaction:  Unknown     No family history on file.   Prior to Admission medications   Medication Sig Start Date End Date Taking? Authorizing Provider  ALPRAZolam Duanne Moron) 0.5 MG tablet Take 0.5-1 tablets (0.25-0.5 mg total) by mouth 3 (three) times daily as needed for anxiety. 03/05/16   Thurnell Lose, MD  aspirin EC 81 MG tablet Take 81 mg by mouth daily with lunch.    [provider]  Cholecalciferol 2000 units TABS Take 1 tablet by mouth daily.    [provider]  ferrous sulfate 325 (65 FE) MG tablet Take 1 tablet (325 mg total) by mouth daily. 03/06/16   Dorie Rank, MD  levothyroxine (SYNTHROID, LEVOTHROID) 75 MCG tablet Take 75 mcg by mouth daily before breakfast.    [provider]  metoprolol succinate (TOPROL-XL) 25 MG 24 hr tablet Take 1 tablet (25 mg total) by mouth daily. 03/05/16   Thurnell Lose, MD  nitroGLYCERIN (NITROSTAT) 0.4 MG SL tablet Place 0.4 mg under the tongue every 5 (five) minutes as needed for chest pain.     [provider]  omeprazole (PRILOSEC) 20 MG capsule Take 20 mg by mouth daily.     [provider]  potassium chloride SA (K-DUR,KLOR-CON) 20 MEQ tablet Take 20 mEq by mouth daily.    [provider]  Silver Sulfadiazine (SILVADENE EX) Apply topically 3 times/day as needed-between meals &  bedtime.    [provider]  silver sulfADIAZINE (SILVADENE) 1 % cream APPLY TO AFFECTED AREAS THREE TIMES DAILY. 10/11/16   Florian Buff, MD  triamcinolone cream (KENALOG) 0.1 % Apply 1 application topically 2 (two) times daily. Pt applies to legs.    [provider]    Physical Exam: Vitals:   01/02/17 0111 01/02/17 0230 01/02/17 0315 01/02/17 0330  BP: 120/71 (!) 114/53  123/81  Pulse: 77  76   Resp: 18     Temp: 97.8 F (36.6 C)     SpO2: 96%  97%   Weight:       Constitutional: NAD, calm, comfortable Vitals:   01/02/17 0111 01/02/17 0230 01/02/17 0315 01/02/17 0330  BP: 120/71 (!) 114/53  123/81  Pulse: 77  76   Resp: 18     Temp: 97.8 F (36.6 C)     SpO2: 96%  97%   Weight:       Eyes: PERRL, lids and conjunctivae normal ENMT: Mucous membranes are moist. Posterior  pharynx clear of any exudate or lesions.Normal dentition.  Neck: normal, supple, no masses, no thyromegaly Respiratory: clear to auscultation bilaterally, no wheezing, no crackles. Normal respiratory effort. No accessory muscle use.  Cardiovascular: Regular rate and rhythm, no murmurs / rubs / gallops. No extremity edema. 2+ pedal pulses. No carotid bruits.  Abdomen: no tenderness, no masses palpated. No hepatosplenomegaly. Bowel sounds positive.  Musculoskeletal: no clubbing / cyanosis. No joint deformity upper and lower extremities. Good ROM, no contractures. Normal muscle tone.  Skin: no rashes, lesions, ulcers. No induration Neurologic: CN 2-12 grossly intact. Sensation intact, DTR normal. Strength 5/5 in all 4.  Psychiatric: Normal judgment and insight. Alert and oriented x 3. Normal mood.   Labs on Admission: I have personally reviewed following labs and imaging studies  CBC:  Recent Labs Lab 01/02/17 0210  WBC 9.1  NEUTROABS 7.6  HGB 12.2  HCT 38.7  MCV 89.0  PLT 409   Basic Metabolic Panel:  Recent Labs Lab 01/02/17 0210  NA 139  K 4.6  CL 101  CO2 31  GLUCOSE  119*  BUN 21*  CREATININE 0.85  CALCIUM 9.8   Liver Function Tests:  Recent Labs Lab 01/02/17 0210  AST 21  ALT 12*  ALKPHOS 71  BILITOT 1.9*  PROT 7.0  ALBUMIN 3.3*   Urine analysis:    Component Value Date/Time   COLORURINE YELLOW 01/02/2017 0330   APPEARANCEUR CLEAR 01/02/2017 0330   LABSPEC 1.017 01/02/2017 0330   PHURINE 5.0 01/02/2017 0330   GLUCOSEU NEGATIVE 01/02/2017 0330   HGBUR NEGATIVE 01/02/2017 0330   BILIRUBINUR NEGATIVE 01/02/2017 0330   KETONESUR 5 (A) 01/02/2017 0330   PROTEINUR NEGATIVE 01/02/2017 0330   UROBILINOGEN 0.2 12/26/2012 1100   NITRITE NEGATIVE 01/02/2017 0330   LEUKOCYTESUR NEGATIVE 01/02/2017 0330    Recent Results (from the past 240 hour(s))  Culture, blood (routine x 2)     Status: None (Preliminary result)   Collection Time: 01/02/17  2:51 AM  Result Value Ref Range Status   Specimen Description BLOOD RIGHT HAND  Final   Special Requests   Final    BOTTLES DRAWN AEROBIC AND ANAEROBIC Blood Culture adequate volume   Culture PENDING  Incomplete   Report Status PENDING  Incomplete  Culture, blood (routine x 2)     Status: None (Preliminary result)   Collection Time: 01/02/17  3:03 AM  Result Value Ref Range Status   Specimen Description BLOOD RIGHT HAND  Final   Special Requests   Final    BOTTLES DRAWN AEROBIC AND ANAEROBIC Blood Culture adequate volume   Culture PENDING  Incomplete   Report Status PENDING  Incomplete     Radiological Exams on Admission: Dg Chest 2 View  Result Date: 01/02/2017 CLINICAL DATA:  Acute onset of hallucinations.  Initial encounter. EXAM: CHEST  2 VIEW COMPARISON:  Chest radiograph performed 03/06/2016 FINDINGS: The lungs are hypoexpanded. Peribronchial thickening is noted. A large hiatal hernia is noted, with surrounding atelectasis. No pleural effusion or pneumothorax is seen. The cardiomediastinal silhouette is mildly enlarged. No acute osseous abnormalities are seen. There is mild chronic deformity  of the proximal left humerus. Clips are noted within the right upper quadrant, reflecting prior cholecystectomy. IMPRESSION: 1. Lungs hypoexpanded.  Peribronchial thickening. 2. Large hiatal hernia, with surrounding atelectasis. 3. Mild cardiomegaly. Electronically Signed   By: Garald Balding M.D.   On: 01/02/2017 02:50   Ct Head Wo Contrast  Result Date: 01/02/2017 CLINICAL DATA:  Visual and tactile hallucinations.  Altered level of consciousness. EXAM: CT HEAD WITHOUT CONTRAST TECHNIQUE: Contiguous axial images were obtained from the base of the skull through the vertex without intravenous contrast. COMPARISON:  None. FINDINGS: Brain: Mild-to-moderate small vessel ischemic disease of periventricular white matter. No acute intracranial hemorrhage, midline shift or edema. No intra-axial mass nor extra-axial fluid collections. Superficial and central atrophy. Vascular: Mild-to-moderate carotid siphon calcifications. No hyperdense vessels. Skull: Negative for fracture or suspicious osseous lesions. Sinuses/Orbits: No acute finding. Small right ethmoid osteoma. Right lens surgery. Other: None IMPRESSION: Atrophy with chronic appearing small vessel ischemic disease of periventricular white matter. No acute intracranial abnormality. Electronically Signed   By: Ashley Royalty M.D.   On: 01/02/2017 02:37    EKG: Independently reviewed.  Assessment/Plan Principal Problem:   Delirium Active Problems:   Hypothyroidism   Essential hypertension   B12 deficiency   Cellulitis   PLAN:   Cellulitis:  Will give her IV Vancomycin per pharmacy dosing.    Delirium:  Could be from the infection, but I think it is more likely to be her medications.  She is on Xanax along with hydrocodone.  Will d/c both.  If she continues to have delirium, it may be difficult for her given that she lives alone, and have already called EMS and Fire Department reportedly several times today.  B12 Deficiency:  Continue with monthly IM  injections.  Her level is normal at this time. Hypothyrodisim;  Will check TSH and continue with her supplements. HTN:  BP is OK, will continue with BB.    DVT prophylaxis: subQ heparin.  Code Status: FULL CODE.  Family Communication:  None.  Disposition Plan: TBD/  Consults called: None.  Admission status: OBS.    Coralee Edberg MD FACP. [facility]  If 7PM-7AM, please contact night-coverage [email or web address] Password [email or web address]  01/02/2017, 4:21 AM

## 2017-01-02 NOTE — Progress Notes (Signed)
Pharmacy Antibiotic Note  Ashley [name] is a 83 y.o. female admitted on 01/02/2017 with cellulitis.  Pharmacy has been consulted for vancomycin dosing. Vancomycin 1gm given earlier today  Plan: Continue vancomycin 1gm IV q48 hours F/u renal function, cultures and clinical course  Height: 4\' 4"  (132.1 cm) Weight: 97 lb 2 oz (44.1 kg) IBW/kg (Calculated) : 27.1  Temp (24hrs), Avg:97.8 F (36.6 C), Min:97.7 F (36.5 C), Max:97.8 F (36.6 C)   Recent Labs Lab 01/02/17 0210 01/02/17 0223  WBC 9.1  --   CREATININE 0.85  --   LATICACIDVEN  --  1.43    Estimated Creatinine Clearance: 27.3 mL/min (by C-G formula based on SCr of 0.85 mg/dL).    Allergies  Allergen Reactions  . Ciprofloxacin Hives and Itching  . Codeine Phosphate Other (See Comments)    Reaction:  Unknown   . Doxycycline Hives and Itching  . Erythromycin Ethylsuccinate Other (See Comments)    Reaction:  Unknown   . Levofloxacin Other (See Comments)    Reaction:  Unknown   . Penicillins Other (See Comments)    Reaction:  Unknown  Has patient had a PCN reaction causing immediate rash, facial/tongue/throat swelling, SOB or lightheadedness with hypotension: Unsure Has patient had a PCN reaction causing severe rash involving mucus membranes or skin necrosis: Unsure Has patient had a PCN reaction that required hospitalization Unsure Has patient had a PCN reaction occurring within the last 10 years: No If all of the above answers are "NO", then may proceed with Cephalosporin use.  . Sulfamethoxazole Other (See Comments)    Reaction:  Unknown   . Tetracycline Hcl Other (See Comments)    Reaction:  Unknown   . Lidocaine Other (See Comments)    Reaction:  Unknown     Antimicrobials this admission: vanc 8/9 >>   Microbiology results: 8/9 BCx: pending  Thank you for allowing pharmacy to be a part of this patient's care.  Beverlee Nims 01/02/2017 8:33 AM

## 2017-01-02 NOTE — Progress Notes (Signed)
Patient seen and examined. Database reviewed. Admitted earlier today from home due to "hallucinations" and lower extremity cellulitis. Patient herself tells me that she does not think that she is managing well at home by herself, however she is reluctant to leave her home long-term. I believe she would really benefit from long-term placement or at the very least short-term for some increased conditioning. She has severe lordosis and difficulty ambulating. For now continue vancomycin for lower extremity cellulitis. PT assessment is pending. Blood cultures remain negative to date. We'll continue to follow.  Domingo Mend, MD [facility] Pager: [phone]

## 2017-01-03 DIAGNOSIS — L03116 Cellulitis of left lower limb: Secondary | ICD-10-CM

## 2017-01-03 DIAGNOSIS — R410 Disorientation, unspecified: Secondary | ICD-10-CM | POA: Diagnosis not present

## 2017-01-03 MED ORDER — CEPHALEXIN 500 MG PO CAPS: 500.0000 mg | ORAL_CAPSULE | Freq: Four times a day (QID) | ORAL | 0 refills | Status: AC

## 2017-01-03 MED ORDER — NYSTATIN 100000 UNIT/GM EX POWD: Freq: Three times a day (TID) | CUTANEOUS | 0 refills | Status: AC

## 2017-01-03 NOTE — Discharge Summary (Signed)
Physician Discharge Summary  Ashley [facility] [name]:[id number] DOB: 04/07/1932 DOA: 01/02/2017  PCP: Redmond School, MD  Admit date: 01/02/2017 Discharge date: 01/03/2017  Time spent: 45 minutes  Recommendations for Outpatient Follow-up:  -To be discharged home today in stable condition. -Will complete a ten-day course of Keflex for lower extremity cellulitis. -Advised to follow up with primary care provider 2 weeks.   Discharge Diagnoses:  Principal Problem:   Delirium Active Problems:   Hypothyroidism   Essential hypertension   B12 deficiency   Cellulitis   Discharge Condition: Stable and improved  Filed Weights   01/02/17 0108 01/02/17 1224  Weight: 45.4 kg (100 lb) 44.1 kg (97 lb 2 oz)    History of present illness:  As per Dr. Marin Comment on 8/9: Ashley [name] is an 83 y.o. female lives alone at home with hx of B12 deficiency, getting monthly B12 shots, anxiety on benzodiazepines, and pain on Hydrocodone, hx of hypothyroidism, GERD, breast CA, called the fire department several times, as she was having hallucination, seeing people in her house that was not there.  She has been taking her pain pills as well.  She has episode of lucidity, and knows her name and that she is at [facility].  She told me her self that she has been on Hydrocodone.  She denied fever, chills, HA, new drug, Vx problem, or any focal neurological symptoms.  Evaluation in the ER Included a negative head CT, normal WBC, renal Fx tests, and unremarkable CBG.  She was found to have a cellulitis of her left lower extremity and was started on her Vancomycin IV.  Due to her living alone, and being confused with hallucination, hospitalist was asked to admit her OBS for delirium and to Tx her with IV antibiotics.   Hospital Course:   Hallucinations -None apparent while hospitalized. -Suspect polypharmacy as etiology. Xanax and hydrocodone have been discontinued.  Bilateral lower extremity cellulitis -Good  improvement on vancomycin while in the hospital. -Has allergies to sulfa, penicillin and doxycycline. Will discharge on a 10 day course of Keflex. -Has a weeping superficial wounds of her left lower extremity which will be wrapped prior to discharge and home health RN for wound care will be arranged.  Procedures:  None   Consultations:  None  Discharge Instructions  Discharge Instructions    Increase activity slowly    Complete by:  As directed      Allergies as of 01/03/2017      Reactions   Ciprofloxacin Hives, Itching   Codeine Phosphate Other (See Comments)   Reaction:  Unknown    Doxycycline Hives, Itching   Erythromycin Ethylsuccinate Other (See Comments)   Reaction:  Unknown    Levofloxacin Other (See Comments)   Reaction:  Unknown    Penicillins Other (See Comments)   Reaction:  Unknown  Has patient had a PCN reaction causing immediate rash, facial/tongue/throat swelling, SOB or lightheadedness with hypotension: Unsure Has patient had a PCN reaction causing severe rash involving mucus membranes or skin necrosis: Unsure Has patient had a PCN reaction that required hospitalization Unsure Has patient had a PCN reaction occurring within the last 10 years: No If all of the above answers are "NO", then may proceed with Cephalosporin use.   Sulfamethoxazole Other (See Comments)   Reaction:  Unknown    Tetracycline Hcl Other (See Comments)   Reaction:  Unknown    Lidocaine Other (See Comments)   Reaction:  Unknown  Medication List    STOP taking these medications   ALPRAZolam 0.5 MG tablet Commonly known as:  XANAX     TAKE these medications   aspirin EC 81 MG tablet Take 81 mg by mouth daily with lunch.   cephALEXin 500 MG capsule Commonly known as:  KEFLEX Take 1 capsule (500 mg total) by mouth 4 (four) times daily.   Cholecalciferol 2000 units Tabs Take 1 tablet by mouth daily.   ferrous sulfate 325 (65 FE) MG tablet Take 1 tablet (325 mg total)  by mouth daily.   HYDROcodone-acetaminophen 10-325 MG tablet Commonly known as:  NORCO Take 1 tablet by mouth 4 (four) times daily.   levothyroxine 75 MCG tablet Commonly known as:  SYNTHROID, LEVOTHROID Take 75 mcg by mouth daily before breakfast.   metoprolol succinate 25 MG 24 hr tablet Commonly known as:  TOPROL-XL Take 1 tablet (25 mg total) by mouth daily.   nitroGLYCERIN 0.4 MG SL tablet Commonly known as:  NITROSTAT Place 0.4 mg under the tongue every 5 (five) minutes as needed for chest pain.   nystatin powder Commonly known as:  MYCOSTATIN/NYSTOP Apply topically 3 (three) times daily.   omeprazole 20 MG capsule Commonly known as:  PRILOSEC Take 20 mg by mouth daily.   potassium chloride SA 20 MEQ tablet Commonly known as:  K-DUR,KLOR-CON Take 20 mEq by mouth daily.   silver sulfADIAZINE 1 % cream Commonly known as:  SILVADENE APPLY TO AFFECTED AREAS THREE TIMES DAILY.   triamcinolone cream 0.1 % Commonly known as:  KENALOG Apply 1 application topically 2 (two) times daily. Pt applies to legs.      Allergies  Allergen Reactions  . Ciprofloxacin Hives and Itching  . Codeine Phosphate Other (See Comments)    Reaction:  Unknown   . Doxycycline Hives and Itching  . Erythromycin Ethylsuccinate Other (See Comments)    Reaction:  Unknown   . Levofloxacin Other (See Comments)    Reaction:  Unknown   . Penicillins Other (See Comments)    Reaction:  Unknown  Has patient had a PCN reaction causing immediate rash, facial/tongue/throat swelling, SOB or lightheadedness with hypotension: Unsure Has patient had a PCN reaction causing severe rash involving mucus membranes or skin necrosis: Unsure Has patient had a PCN reaction that required hospitalization Unsure Has patient had a PCN reaction occurring within the last 10 years: No If all of the above answers are "NO", then may proceed with Cephalosporin use.  . Sulfamethoxazole Other (See Comments)    Reaction:   Unknown   . Tetracycline Hcl Other (See Comments)    Reaction:  Unknown   . Lidocaine Other (See Comments)    Reaction:  Unknown    Follow-up Information    Redmond School, MD. Schedule an appointment as soon as possible for a visit in 2 week(s).   Specialty:  Internal Medicine Contact information: [address] [place] [address] [phone]            The results of significant diagnostics from this hospitalization (including imaging, microbiology, ancillary and laboratory) are listed below for reference.    Significant Diagnostic Studies: Dg Chest 2 View  Result Date: 01/02/2017 CLINICAL DATA:  Acute onset of hallucinations.  Initial encounter. EXAM: CHEST  2 VIEW COMPARISON:  Chest radiograph performed 03/06/2016 FINDINGS: The lungs are hypoexpanded. Peribronchial thickening is noted. A large hiatal hernia is noted, with surrounding atelectasis. No pleural effusion or pneumothorax is seen. The cardiomediastinal silhouette is mildly enlarged. No  acute osseous abnormalities are seen. There is mild chronic deformity of the proximal left humerus. Clips are noted within the right upper quadrant, reflecting prior cholecystectomy. IMPRESSION: 1. Lungs hypoexpanded.  Peribronchial thickening. 2. Large hiatal hernia, with surrounding atelectasis. 3. Mild cardiomegaly. Electronically Signed   By: Garald Balding M.D.   On: 01/02/2017 02:50   Ct Head Wo Contrast  Result Date: 01/02/2017 CLINICAL DATA:  Visual and tactile hallucinations. Altered level of consciousness. EXAM: CT HEAD WITHOUT CONTRAST TECHNIQUE: Contiguous axial images were obtained from the base of the skull through the vertex without intravenous contrast. COMPARISON:  None. FINDINGS: Brain: Mild-to-moderate small vessel ischemic disease of periventricular white matter. No acute intracranial hemorrhage, midline shift or edema. No intra-axial mass nor extra-axial fluid collections. Superficial and central atrophy.  Vascular: Mild-to-moderate carotid siphon calcifications. No hyperdense vessels. Skull: Negative for fracture or suspicious osseous lesions. Sinuses/Orbits: No acute finding. Small right ethmoid osteoma. Right lens surgery. Other: None IMPRESSION: Atrophy with chronic appearing small vessel ischemic disease of periventricular white matter. No acute intracranial abnormality. Electronically Signed   By: Ashley Royalty M.D.   On: 01/02/2017 02:37    Microbiology: Recent Results (from the past 240 hour(s))  Culture, blood (routine x 2)     Status: None (Preliminary result)   Collection Time: 01/02/17  2:51 AM  Result Value Ref Range Status   Specimen Description BLOOD RIGHT HAND  Final   Special Requests   Final    BOTTLES DRAWN AEROBIC AND ANAEROBIC Blood Culture adequate volume   Culture NO GROWTH 1 DAY  Final   Report Status PENDING  Incomplete  Culture, blood (routine x 2)     Status: None (Preliminary result)   Collection Time: 01/02/17  3:03 AM  Result Value Ref Range Status   Specimen Description BLOOD RIGHT HAND  Final   Special Requests   Final    BOTTLES DRAWN AEROBIC AND ANAEROBIC Blood Culture adequate volume   Culture NO GROWTH 1 DAY  Final   Report Status PENDING  Incomplete     Labs: Basic Metabolic Panel:  Recent Labs Lab 01/02/17 0210  NA 139  K 4.6  CL 101  CO2 31  GLUCOSE 119*  BUN 21*  CREATININE 0.85  CALCIUM 9.8   Liver Function Tests:  Recent Labs Lab 01/02/17 0210  AST 21  ALT 12*  ALKPHOS 71  BILITOT 1.9*  PROT 7.0  ALBUMIN 3.3*   No results for input(s): LIPASE, AMYLASE in the last 168 hours. No results for input(s): AMMONIA in the last 168 hours. CBC:  Recent Labs Lab 01/02/17 0210  WBC 9.1  NEUTROABS 7.6  HGB 12.2  HCT 38.7  MCV 89.0  PLT 184   Cardiac Enzymes: No results for input(s): CKTOTAL, CKMB, CKMBINDEX, TROPONINI in the last 168 hours. BNP: BNP (last 3 results) No results for input(s): BNP in the last 8760  hours.  ProBNP (last 3 results) No results for input(s): PROBNP in the last 8760 hours.  CBG: No results for input(s): GLUCAP in the last 168 hours.     SignedLelon Frohlich  [facility] Pager: [phone] 01/03/2017, 2:54 PM

## 2017-01-03 NOTE — Progress Notes (Signed)
Dressing changed to the left lower extremity.  The extremity was cleansed with soap and H20, and Impregnated petroleum dressing, abd pads, wrapped in cling and ace bandage was added.

## 2017-01-03 NOTE — Evaluation (Signed)
Physical Therapy Evaluation Patient Details Name: Ashley [name] MRN: [id number] DOB: [birth year] Today's Date: 01/03/2017   History of Present Illness  83 yo female with onset of LE cellulitis, has atelectasis and had auditory and tactile hallucinations.  PMHx:  B 12 defic, cardiomegaly, LUE humeral deformity  Clinical Impression  Pt was assessed and noted her mobility was manageable supervised but could be a fall risk at home.  Will need to have 24/7 help which she is potentially able to have, but will need to confirm.  Follow acutely for progression of mobiltiy and strengthening as tolerated given LLE cellulitis and her issues with recent confusion.    Follow Up Recommendations Home health PT;Supervision for mobility/OOB;Supervision/Assistance - 24 hour    Equipment Recommendations  Rolling walker with 5" wheels (potentially)    Recommendations for Other Services       Precautions / Restrictions Precautions Precautions: Fall Restrictions Weight Bearing Restrictions: No Other Position/Activity Restrictions: has sensitivity on LUE from old injuries as well as chest from arthritis per pt      Mobility  Bed Mobility Overal bed mobility: Needs Assistance Bed Mobility: Sit to Supine       Sit to supine: Min assist   General bed mobility comments: assisted to lift legs to bed  Transfers Overall transfer level: Needs assistance Equipment used: Quad cane;1 person hand held assist Transfers: Sit to/from Stand Sit to Stand: Min guard         General transfer comment: pt is aware of using bed for standing up  Ambulation/Gait Ambulation/Gait assistance: Min guard Ambulation Distance (Feet): 80 Feet Assistive device: Quad cane (B canes) Gait Pattern/deviations: Step-through pattern;Wide base of support;Trunk flexed;Shuffle;Decreased stride length Gait velocity: reduced Gait velocity interpretation: Below normal speed for age/gender General Gait Details: pt walks then  talks and walks again, but not SOB  Stairs            Wheelchair Mobility    Modified Rankin (Stroke Patients Only)       Balance Overall balance assessment: Needs assistance;History of Falls Sitting-balance support: Feet supported Sitting balance-Leahy Scale: Fair     Standing balance support: Bilateral upper extremity supported Standing balance-Leahy Scale: Fair Standing balance comment: less than fair dynamically                             Pertinent Vitals/Pain Pain Assessment: Faces Faces Pain Scale: Hurts a little bit Pain Location: LUE with any touch movement Pain Intervention(s): Monitored during session;Repositioned    Home Living Family/patient expects to be discharged to:: Private residence Living Arrangements: Non-relatives/Friends Available Help at Discharge: Friend(s) Type of Home: House Home Access: Stairs to enter   [employer] of Steps: 2 back door with no HR.  Home Layout: Two level;Able to live on main level with bedroom/bathroom Home Equipment: Bedside commode;Cane - quad;Walker - 2 wheels Additional Comments: uses B quad canes now    Prior Function Level of Independence: Independent with assistive device(s)               Hand Dominance   Dominant Hand: Right    Extremity/Trunk Assessment   Upper Extremity Assessment Upper Extremity Assessment: Overall WFL for tasks assessed    Lower Extremity Assessment Lower Extremity Assessment: Generalized weakness (but can walk without LOB on B quad canes)    Cervical / Trunk Assessment Cervical / Trunk Assessment: Kyphotic  Communication   Communication: No difficulties  Cognition Arousal/Alertness: Awake/alert Behavior  During Therapy: WFL for tasks assessed/performed Overall Cognitive Status: Within Functional Limits for tasks assessed                                        General Comments General comments (skin integrity, edema, etc.): Pt  is able to walk but with B canes should have 24/7 help to go home    Exercises     Assessment/Plan    PT Assessment Patient needs continued PT services  PT Problem List Decreased strength;Decreased range of motion;Decreased activity tolerance;Decreased balance;Decreased mobility;Decreased coordination;Decreased cognition;Decreased knowledge of use of DME;Decreased safety awareness;Cardiopulmonary status limiting activity;Decreased skin integrity;Pain       PT Treatment Interventions DME instruction;Gait training;Functional mobility training;Therapeutic activities;Therapeutic exercise;Stair training;Balance training;Neuromuscular re-education;Patient/family education    PT Goals (Current goals can be found in the Care Plan section)  Acute Rehab PT Goals Patient Stated Goal: to get home and feel better PT Goal Formulation: With patient Time For Goal Achievement: 01/17/17 Potential to Achieve Goals: Good    Frequency Min 3X/week   Barriers to discharge Decreased caregiver support has help but may not be 24/7    Co-evaluation               AM-PAC PT "6 Clicks" Daily Activity  Outcome Measure Difficulty turning over in bed (including adjusting bedclothes, sheets and blankets)?: Total Difficulty moving from lying on back to sitting on the side of the bed? : Total Difficulty sitting down on and standing up from a chair with arms (e.g., wheelchair, bedside commode, etc,.)?: Total Help needed moving to and from a bed to chair (including a wheelchair)?: A Little Help needed walking in hospital room?: A Little Help needed climbing 3-5 steps with a railing? : A Lot 6 Click Score: 11    End of Session Equipment Utilized During Treatment: Gait belt Activity Tolerance: Patient tolerated treatment well Patient left: in bed;with call bell/phone within reach;Other (comment) (pt declined recliner and wants a wc which was not appropriat) Nurse Communication: Mobility status PT Visit  Diagnosis: Repeated falls (R29.6);Unsteadiness on feet (R26.81);Muscle weakness (generalized) (M62.81)    Time: 1700-1749 PT Time Calculation (min) (ACUTE ONLY): 38 min   Charges:   PT Evaluation $PT Eval Low Complexity: 1 Low PT Treatments $Gait Training: 8-22 mins $Therapeutic Exercise: 8-22 mins   PT G Codes:   PT G-Codes **NOT FOR INPATIENT CLASS** Functional Assessment Tool Used: AM-PAC 6 Clicks Basic Mobility;Clinical judgement Functional Limitation: Mobility: Walking and moving around Mobility: Walking and Moving Around Current Status (S4967): At least 20 percent but less than 40 percent impaired, limited or restricted Mobility: Walking and Moving Around Goal Status [phone]): At least 1 percent but less than 20 percent impaired, limited or restricted    Ramond Dial 01/03/2017, 10:24 AM   10:26 AM, 01/03/17 Mee Hives, PT, MS Physical Therapist - [place] [phone] (Office)

## 2017-01-03 NOTE — Care Management Note (Signed)
Case Management Note  Patient Details  Name: Ashley [name] MRN: [id number] Date of Birth: [birth year]  Expected Discharge Date:       01/03/2017           Expected Discharge Plan:  [place]  In-House Referral:  Nutrition  Discharge planning Services  CM Consult  Post Acute Care Choice:  Home Health, Resumption of Svcs/PTA Provider Choice offered to:  Patient  HH Arranged:  RN, PT, Nurse's Aide, Social Work [employer] Agency:  [place]  Status of Service:  Completed, signed off  Additional Comments: Pt discharging home today with resumption of [facility] services. Aware HH has 48hrs to resume services. [facility] rep, aware of DC today. Pt has recommended RW, pt already has two at home.   Sherald Barge, RN 01/03/2017, 2:40 PM

## 2017-01-04 NOTE — Progress Notes (Signed)
Patient discharged with instructions, prescription, and care notes.  Verbalized understanding via teach back.  IV was removed and the site was WNL. Patient voiced no further complaints or concerns at the time of discharge.  Appointments scheduled per instructions.  Patient left the floor via w/c family  And staff in stable condition. 

## 2017-01-06 DIAGNOSIS — L989 Disorder of the skin and subcutaneous tissue, unspecified: Secondary | ICD-10-CM | POA: Diagnosis not present

## 2017-01-06 DIAGNOSIS — Z170 Estrogen receptor positive status [ER+]: Secondary | ICD-10-CM | POA: Diagnosis not present

## 2017-01-06 DIAGNOSIS — M810 Age-related osteoporosis without current pathological fracture: Secondary | ICD-10-CM | POA: Diagnosis not present

## 2017-01-06 DIAGNOSIS — I10 Essential (primary) hypertension: Secondary | ICD-10-CM | POA: Diagnosis not present

## 2017-01-06 DIAGNOSIS — C50912 Malignant neoplasm of unspecified site of left female breast: Secondary | ICD-10-CM | POA: Diagnosis not present

## 2017-01-06 DIAGNOSIS — E539 Vitamin B deficiency, unspecified: Secondary | ICD-10-CM | POA: Diagnosis not present

## 2017-01-06 DIAGNOSIS — Z9981 Dependence on supplemental oxygen: Secondary | ICD-10-CM | POA: Diagnosis not present

## 2017-01-06 DIAGNOSIS — Z7982 Long term (current) use of aspirin: Secondary | ICD-10-CM | POA: Diagnosis not present

## 2017-01-07 LAB — CULTURE, BLOOD (ROUTINE X 2)
CULTURE: NO GROWTH
Culture: NO GROWTH
SPECIAL REQUESTS: ADEQUATE
Special Requests: ADEQUATE

## 2017-01-09 DIAGNOSIS — E539 Vitamin B deficiency, unspecified: Secondary | ICD-10-CM | POA: Diagnosis not present

## 2017-01-09 DIAGNOSIS — I10 Essential (primary) hypertension: Secondary | ICD-10-CM | POA: Diagnosis not present

## 2017-01-09 DIAGNOSIS — Z9981 Dependence on supplemental oxygen: Secondary | ICD-10-CM | POA: Diagnosis not present

## 2017-01-09 DIAGNOSIS — M810 Age-related osteoporosis without current pathological fracture: Secondary | ICD-10-CM | POA: Diagnosis not present

## 2017-01-09 DIAGNOSIS — L989 Disorder of the skin and subcutaneous tissue, unspecified: Secondary | ICD-10-CM | POA: Diagnosis not present

## 2017-01-09 DIAGNOSIS — Z170 Estrogen receptor positive status [ER+]: Secondary | ICD-10-CM | POA: Diagnosis not present

## 2017-01-09 DIAGNOSIS — Z7982 Long term (current) use of aspirin: Secondary | ICD-10-CM | POA: Diagnosis not present

## 2017-01-09 DIAGNOSIS — C50912 Malignant neoplasm of unspecified site of left female breast: Secondary | ICD-10-CM | POA: Diagnosis not present

## 2017-01-10 DIAGNOSIS — Z7982 Long term (current) use of aspirin: Secondary | ICD-10-CM | POA: Diagnosis not present

## 2017-01-10 DIAGNOSIS — Z9981 Dependence on supplemental oxygen: Secondary | ICD-10-CM | POA: Diagnosis not present

## 2017-01-10 DIAGNOSIS — E539 Vitamin B deficiency, unspecified: Secondary | ICD-10-CM | POA: Diagnosis not present

## 2017-01-10 DIAGNOSIS — L989 Disorder of the skin and subcutaneous tissue, unspecified: Secondary | ICD-10-CM | POA: Diagnosis not present

## 2017-01-10 DIAGNOSIS — C50912 Malignant neoplasm of unspecified site of left female breast: Secondary | ICD-10-CM | POA: Diagnosis not present

## 2017-01-10 DIAGNOSIS — Z170 Estrogen receptor positive status [ER+]: Secondary | ICD-10-CM | POA: Diagnosis not present

## 2017-01-10 DIAGNOSIS — I10 Essential (primary) hypertension: Secondary | ICD-10-CM | POA: Diagnosis not present

## 2017-01-10 DIAGNOSIS — M810 Age-related osteoporosis without current pathological fracture: Secondary | ICD-10-CM | POA: Diagnosis not present

## 2017-01-13 DIAGNOSIS — Z9981 Dependence on supplemental oxygen: Secondary | ICD-10-CM | POA: Diagnosis not present

## 2017-01-13 DIAGNOSIS — I10 Essential (primary) hypertension: Secondary | ICD-10-CM | POA: Diagnosis not present

## 2017-01-13 DIAGNOSIS — Z7982 Long term (current) use of aspirin: Secondary | ICD-10-CM | POA: Diagnosis not present

## 2017-01-13 DIAGNOSIS — L989 Disorder of the skin and subcutaneous tissue, unspecified: Secondary | ICD-10-CM | POA: Diagnosis not present

## 2017-01-13 DIAGNOSIS — M810 Age-related osteoporosis without current pathological fracture: Secondary | ICD-10-CM | POA: Diagnosis not present

## 2017-01-13 DIAGNOSIS — E539 Vitamin B deficiency, unspecified: Secondary | ICD-10-CM | POA: Diagnosis not present

## 2017-01-13 DIAGNOSIS — Z170 Estrogen receptor positive status [ER+]: Secondary | ICD-10-CM | POA: Diagnosis not present

## 2017-01-13 DIAGNOSIS — C50912 Malignant neoplasm of unspecified site of left female breast: Secondary | ICD-10-CM | POA: Diagnosis not present

## 2017-01-15 DIAGNOSIS — L989 Disorder of the skin and subcutaneous tissue, unspecified: Secondary | ICD-10-CM | POA: Diagnosis not present

## 2017-01-15 DIAGNOSIS — Z170 Estrogen receptor positive status [ER+]: Secondary | ICD-10-CM | POA: Diagnosis not present

## 2017-01-15 DIAGNOSIS — Z9981 Dependence on supplemental oxygen: Secondary | ICD-10-CM | POA: Diagnosis not present

## 2017-01-15 DIAGNOSIS — C50912 Malignant neoplasm of unspecified site of left female breast: Secondary | ICD-10-CM | POA: Diagnosis not present

## 2017-01-15 DIAGNOSIS — E539 Vitamin B deficiency, unspecified: Secondary | ICD-10-CM | POA: Diagnosis not present

## 2017-01-15 DIAGNOSIS — Z7982 Long term (current) use of aspirin: Secondary | ICD-10-CM | POA: Diagnosis not present

## 2017-01-15 DIAGNOSIS — I10 Essential (primary) hypertension: Secondary | ICD-10-CM | POA: Diagnosis not present

## 2017-01-15 DIAGNOSIS — M810 Age-related osteoporosis without current pathological fracture: Secondary | ICD-10-CM | POA: Diagnosis not present

## 2017-01-17 DIAGNOSIS — K5289 Other specified noninfective gastroenteritis and colitis: Secondary | ICD-10-CM | POA: Diagnosis not present

## 2017-01-17 DIAGNOSIS — M159 Polyosteoarthritis, unspecified: Secondary | ICD-10-CM | POA: Diagnosis not present

## 2017-01-19 DIAGNOSIS — R062 Wheezing: Secondary | ICD-10-CM | POA: Diagnosis not present

## 2017-01-19 DIAGNOSIS — J9610 Chronic respiratory failure, unspecified whether with hypoxia or hypercapnia: Secondary | ICD-10-CM | POA: Diagnosis not present

## 2017-01-19 DIAGNOSIS — R0602 Shortness of breath: Secondary | ICD-10-CM | POA: Diagnosis not present

## 2017-01-20 ENCOUNTER — Encounter (HOSPITAL_COMMUNITY): Payer: Medicare Other | Attending: Adult Health

## 2017-01-20 VITALS — BP 99/42 | HR 61 | Temp 97.9°F | Resp 16

## 2017-01-20 DIAGNOSIS — M810 Age-related osteoporosis without current pathological fracture: Secondary | ICD-10-CM

## 2017-01-20 DIAGNOSIS — D509 Iron deficiency anemia, unspecified: Secondary | ICD-10-CM

## 2017-01-20 DIAGNOSIS — E538 Deficiency of other specified B group vitamins: Secondary | ICD-10-CM | POA: Diagnosis not present

## 2017-01-20 MED ORDER — CYANOCOBALAMIN 1000 MCG/ML IJ SOLN
INTRAMUSCULAR | Status: AC
Start: 2017-01-20 — End: ?
  Filled 2017-01-20: qty 1

## 2017-01-20 MED ORDER — CYANOCOBALAMIN 1000 MCG/ML IJ SOLN
1000.0000 ug | Freq: Once | INTRAMUSCULAR | Status: AC
  Administered 2017-01-20: 1000 ug via INTRAMUSCULAR

## 2017-01-20 NOTE — Patient Instructions (Signed)
[facility] at [facility] Discharge Instructions  RECOMMENDATIONS MADE BY THE CONSULTANT AND ANY TEST RESULTS WILL BE SENT TO YOUR REFERRING PHYSICIAN.  B12 injection given Follow up as scheduled.  Thank you for choosing [facility] at [facility] to provide your oncology and hematology care.  To afford each patient quality time with our provider, please arrive at least 15 minutes before your scheduled appointment time.    If you have a lab appointment with the Cancer Center please come in thru the  Main Entrance and check in at the main information desk  You need to re-schedule your appointment should you arrive 10 or more minutes late.  We strive to give you quality time with our providers, and arriving late affects you and other patients whose appointments are after yours.  Also, if you no show three or more times for appointments you may be dismissed from the clinic at the providers discretion.     Again, thank you for choosing [facility].  Our hope is that these requests will decrease the amount of time that you wait before being seen by our physicians.       _____________________________________________________________  Should you have questions after your visit to  Cancer Center, please contact our office at [phone] between the hours of 8:30 a.m. and 4:30 p.m.  Voicemails left after 4:30 p.m. will not be returned until the following business day.  For prescription refill requests, have your pharmacy contact our office.       Resources For Cancer Patients and their Caregivers ? American Cancer Society: Can assist with transportation, wigs, general needs, runs Look Good Feel Better.        1-888-227-6333 ? Cancer Care: Provides financial assistance, online support groups, medication/co-pay assistance.  1-800-813-HOPE (4673) ? [facility] Assists [place] cancer patients and  their families through emotional , educational and financial support.  336-427-4357 ? [place] DSS Where to apply for food stamps, Medicaid and utility assistance. 336-342-1394 ? [facility]: Transportation to medical appointments. 336-347-2287 ? Social Security Administration: May apply for disability if have a Stage IV cancer. 336-342-7796 1-800-772-1213 ? [place] Aging, Disability and Transit Services: Assists with nutrition, care and transit needs. 336-349-2343  Cancer Center Support Programs: @10RELATIVEDAYS@ > Cancer Support Group  2nd Tuesday of the month 1pm-2pm, Journey Room  > Creative Journey  3rd Tuesday of the month 1130am-1pm, Journey Room  > Look Good Feel Better  1st Wednesday of the month 10am-12 noon, Journey Room (Call American Cancer Society to register 1-800-395-5775)   

## 2017-01-20 NOTE — Progress Notes (Signed)
Ashley [name] presents today for injection per MD orders. B12 1,000 mcg administered IM in right Upper Arm. Administration without incident. Patient tolerated well. Vitals stable and discharged home from clinic via wheelchair. Follow up as scheduled.

## 2017-01-21 DIAGNOSIS — Z7982 Long term (current) use of aspirin: Secondary | ICD-10-CM | POA: Diagnosis not present

## 2017-01-21 DIAGNOSIS — I10 Essential (primary) hypertension: Secondary | ICD-10-CM | POA: Diagnosis not present

## 2017-01-21 DIAGNOSIS — Z170 Estrogen receptor positive status [ER+]: Secondary | ICD-10-CM | POA: Diagnosis not present

## 2017-01-21 DIAGNOSIS — Z9981 Dependence on supplemental oxygen: Secondary | ICD-10-CM | POA: Diagnosis not present

## 2017-01-21 DIAGNOSIS — L989 Disorder of the skin and subcutaneous tissue, unspecified: Secondary | ICD-10-CM | POA: Diagnosis not present

## 2017-01-21 DIAGNOSIS — E539 Vitamin B deficiency, unspecified: Secondary | ICD-10-CM | POA: Diagnosis not present

## 2017-01-21 DIAGNOSIS — C50912 Malignant neoplasm of unspecified site of left female breast: Secondary | ICD-10-CM | POA: Diagnosis not present

## 2017-01-21 DIAGNOSIS — M810 Age-related osteoporosis without current pathological fracture: Secondary | ICD-10-CM | POA: Diagnosis not present

## 2017-01-24 DIAGNOSIS — Z9981 Dependence on supplemental oxygen: Secondary | ICD-10-CM | POA: Diagnosis not present

## 2017-01-24 DIAGNOSIS — C50912 Malignant neoplasm of unspecified site of left female breast: Secondary | ICD-10-CM | POA: Diagnosis not present

## 2017-01-24 DIAGNOSIS — Z170 Estrogen receptor positive status [ER+]: Secondary | ICD-10-CM | POA: Diagnosis not present

## 2017-01-24 DIAGNOSIS — Z7982 Long term (current) use of aspirin: Secondary | ICD-10-CM | POA: Diagnosis not present

## 2017-01-24 DIAGNOSIS — E539 Vitamin B deficiency, unspecified: Secondary | ICD-10-CM | POA: Diagnosis not present

## 2017-01-24 DIAGNOSIS — L989 Disorder of the skin and subcutaneous tissue, unspecified: Secondary | ICD-10-CM | POA: Diagnosis not present

## 2017-01-24 DIAGNOSIS — M810 Age-related osteoporosis without current pathological fracture: Secondary | ICD-10-CM | POA: Diagnosis not present

## 2017-01-24 DIAGNOSIS — I10 Essential (primary) hypertension: Secondary | ICD-10-CM | POA: Diagnosis not present

## 2017-01-27 DIAGNOSIS — Z170 Estrogen receptor positive status [ER+]: Secondary | ICD-10-CM | POA: Diagnosis not present

## 2017-01-27 DIAGNOSIS — L989 Disorder of the skin and subcutaneous tissue, unspecified: Secondary | ICD-10-CM | POA: Diagnosis not present

## 2017-01-27 DIAGNOSIS — Z7982 Long term (current) use of aspirin: Secondary | ICD-10-CM | POA: Diagnosis not present

## 2017-01-27 DIAGNOSIS — M810 Age-related osteoporosis without current pathological fracture: Secondary | ICD-10-CM | POA: Diagnosis not present

## 2017-01-27 DIAGNOSIS — Z9981 Dependence on supplemental oxygen: Secondary | ICD-10-CM | POA: Diagnosis not present

## 2017-01-27 DIAGNOSIS — C50912 Malignant neoplasm of unspecified site of left female breast: Secondary | ICD-10-CM | POA: Diagnosis not present

## 2017-01-27 DIAGNOSIS — I10 Essential (primary) hypertension: Secondary | ICD-10-CM | POA: Diagnosis not present

## 2017-01-27 DIAGNOSIS — E539 Vitamin B deficiency, unspecified: Secondary | ICD-10-CM | POA: Diagnosis not present

## 2017-01-28 DIAGNOSIS — L989 Disorder of the skin and subcutaneous tissue, unspecified: Secondary | ICD-10-CM | POA: Diagnosis not present

## 2017-01-31 DIAGNOSIS — I10 Essential (primary) hypertension: Secondary | ICD-10-CM | POA: Diagnosis not present

## 2017-01-31 DIAGNOSIS — M810 Age-related osteoporosis without current pathological fracture: Secondary | ICD-10-CM | POA: Diagnosis not present

## 2017-01-31 DIAGNOSIS — Z170 Estrogen receptor positive status [ER+]: Secondary | ICD-10-CM | POA: Diagnosis not present

## 2017-01-31 DIAGNOSIS — Z9981 Dependence on supplemental oxygen: Secondary | ICD-10-CM | POA: Diagnosis not present

## 2017-01-31 DIAGNOSIS — L989 Disorder of the skin and subcutaneous tissue, unspecified: Secondary | ICD-10-CM | POA: Diagnosis not present

## 2017-01-31 DIAGNOSIS — Z7982 Long term (current) use of aspirin: Secondary | ICD-10-CM | POA: Diagnosis not present

## 2017-01-31 DIAGNOSIS — E539 Vitamin B deficiency, unspecified: Secondary | ICD-10-CM | POA: Diagnosis not present

## 2017-01-31 DIAGNOSIS — C50912 Malignant neoplasm of unspecified site of left female breast: Secondary | ICD-10-CM | POA: Diagnosis not present

## 2017-02-03 DIAGNOSIS — Z7982 Long term (current) use of aspirin: Secondary | ICD-10-CM | POA: Diagnosis not present

## 2017-02-03 DIAGNOSIS — C50912 Malignant neoplasm of unspecified site of left female breast: Secondary | ICD-10-CM | POA: Diagnosis not present

## 2017-02-03 DIAGNOSIS — Z9981 Dependence on supplemental oxygen: Secondary | ICD-10-CM | POA: Diagnosis not present

## 2017-02-03 DIAGNOSIS — L989 Disorder of the skin and subcutaneous tissue, unspecified: Secondary | ICD-10-CM | POA: Diagnosis not present

## 2017-02-03 DIAGNOSIS — E539 Vitamin B deficiency, unspecified: Secondary | ICD-10-CM | POA: Diagnosis not present

## 2017-02-03 DIAGNOSIS — M810 Age-related osteoporosis without current pathological fracture: Secondary | ICD-10-CM | POA: Diagnosis not present

## 2017-02-03 DIAGNOSIS — I10 Essential (primary) hypertension: Secondary | ICD-10-CM | POA: Diagnosis not present

## 2017-02-03 DIAGNOSIS — Z170 Estrogen receptor positive status [ER+]: Secondary | ICD-10-CM | POA: Diagnosis not present

## 2017-02-04 DIAGNOSIS — L989 Disorder of the skin and subcutaneous tissue, unspecified: Secondary | ICD-10-CM | POA: Diagnosis not present

## 2017-02-06 DIAGNOSIS — L989 Disorder of the skin and subcutaneous tissue, unspecified: Secondary | ICD-10-CM | POA: Diagnosis not present

## 2017-02-06 DIAGNOSIS — I10 Essential (primary) hypertension: Secondary | ICD-10-CM | POA: Diagnosis not present

## 2017-02-06 DIAGNOSIS — E539 Vitamin B deficiency, unspecified: Secondary | ICD-10-CM | POA: Diagnosis not present

## 2017-02-06 DIAGNOSIS — C50912 Malignant neoplasm of unspecified site of left female breast: Secondary | ICD-10-CM | POA: Diagnosis not present

## 2017-02-06 DIAGNOSIS — Z7982 Long term (current) use of aspirin: Secondary | ICD-10-CM | POA: Diagnosis not present

## 2017-02-06 DIAGNOSIS — Z9981 Dependence on supplemental oxygen: Secondary | ICD-10-CM | POA: Diagnosis not present

## 2017-02-06 DIAGNOSIS — Z170 Estrogen receptor positive status [ER+]: Secondary | ICD-10-CM | POA: Diagnosis not present

## 2017-02-06 DIAGNOSIS — M810 Age-related osteoporosis without current pathological fracture: Secondary | ICD-10-CM | POA: Diagnosis not present

## 2017-02-10 DIAGNOSIS — Z7982 Long term (current) use of aspirin: Secondary | ICD-10-CM | POA: Diagnosis not present

## 2017-02-10 DIAGNOSIS — Z9981 Dependence on supplemental oxygen: Secondary | ICD-10-CM | POA: Diagnosis not present

## 2017-02-10 DIAGNOSIS — L989 Disorder of the skin and subcutaneous tissue, unspecified: Secondary | ICD-10-CM | POA: Diagnosis not present

## 2017-02-10 DIAGNOSIS — C50912 Malignant neoplasm of unspecified site of left female breast: Secondary | ICD-10-CM | POA: Diagnosis not present

## 2017-02-10 DIAGNOSIS — M810 Age-related osteoporosis without current pathological fracture: Secondary | ICD-10-CM | POA: Diagnosis not present

## 2017-02-10 DIAGNOSIS — E539 Vitamin B deficiency, unspecified: Secondary | ICD-10-CM | POA: Diagnosis not present

## 2017-02-10 DIAGNOSIS — Z170 Estrogen receptor positive status [ER+]: Secondary | ICD-10-CM | POA: Diagnosis not present

## 2017-02-10 DIAGNOSIS — I10 Essential (primary) hypertension: Secondary | ICD-10-CM | POA: Diagnosis not present

## 2017-02-13 DIAGNOSIS — Z7982 Long term (current) use of aspirin: Secondary | ICD-10-CM | POA: Diagnosis not present

## 2017-02-13 DIAGNOSIS — Z170 Estrogen receptor positive status [ER+]: Secondary | ICD-10-CM | POA: Diagnosis not present

## 2017-02-13 DIAGNOSIS — Z9981 Dependence on supplemental oxygen: Secondary | ICD-10-CM | POA: Diagnosis not present

## 2017-02-13 DIAGNOSIS — E539 Vitamin B deficiency, unspecified: Secondary | ICD-10-CM | POA: Diagnosis not present

## 2017-02-13 DIAGNOSIS — L989 Disorder of the skin and subcutaneous tissue, unspecified: Secondary | ICD-10-CM | POA: Diagnosis not present

## 2017-02-13 DIAGNOSIS — M810 Age-related osteoporosis without current pathological fracture: Secondary | ICD-10-CM | POA: Diagnosis not present

## 2017-02-13 DIAGNOSIS — I10 Essential (primary) hypertension: Secondary | ICD-10-CM | POA: Diagnosis not present

## 2017-02-13 DIAGNOSIS — C50912 Malignant neoplasm of unspecified site of left female breast: Secondary | ICD-10-CM | POA: Diagnosis not present

## 2017-02-17 DIAGNOSIS — Z9981 Dependence on supplemental oxygen: Secondary | ICD-10-CM | POA: Diagnosis not present

## 2017-02-17 DIAGNOSIS — M810 Age-related osteoporosis without current pathological fracture: Secondary | ICD-10-CM | POA: Diagnosis not present

## 2017-02-17 DIAGNOSIS — I10 Essential (primary) hypertension: Secondary | ICD-10-CM | POA: Diagnosis not present

## 2017-02-17 DIAGNOSIS — C50912 Malignant neoplasm of unspecified site of left female breast: Secondary | ICD-10-CM | POA: Diagnosis not present

## 2017-02-17 DIAGNOSIS — Z170 Estrogen receptor positive status [ER+]: Secondary | ICD-10-CM | POA: Diagnosis not present

## 2017-02-17 DIAGNOSIS — Z7982 Long term (current) use of aspirin: Secondary | ICD-10-CM | POA: Diagnosis not present

## 2017-02-17 DIAGNOSIS — E539 Vitamin B deficiency, unspecified: Secondary | ICD-10-CM | POA: Diagnosis not present

## 2017-02-17 DIAGNOSIS — L989 Disorder of the skin and subcutaneous tissue, unspecified: Secondary | ICD-10-CM | POA: Diagnosis not present

## 2017-02-19 DIAGNOSIS — R062 Wheezing: Secondary | ICD-10-CM | POA: Diagnosis not present

## 2017-02-19 DIAGNOSIS — J9610 Chronic respiratory failure, unspecified whether with hypoxia or hypercapnia: Secondary | ICD-10-CM | POA: Diagnosis not present

## 2017-02-19 DIAGNOSIS — R0602 Shortness of breath: Secondary | ICD-10-CM | POA: Diagnosis not present

## 2017-02-20 ENCOUNTER — Encounter (HOSPITAL_COMMUNITY): Payer: Medicare Other | Attending: Oncology

## 2017-02-20 ENCOUNTER — Encounter (HOSPITAL_COMMUNITY): Payer: Self-pay

## 2017-02-20 VITALS — BP 105/54 | HR 59 | Resp 16

## 2017-02-20 DIAGNOSIS — L989 Disorder of the skin and subcutaneous tissue, unspecified: Secondary | ICD-10-CM | POA: Diagnosis not present

## 2017-02-20 DIAGNOSIS — M40209 Unspecified kyphosis, site unspecified: Secondary | ICD-10-CM | POA: Insufficient documentation

## 2017-02-20 DIAGNOSIS — D61818 Other pancytopenia: Secondary | ICD-10-CM | POA: Insufficient documentation

## 2017-02-20 DIAGNOSIS — Z9049 Acquired absence of other specified parts of digestive tract: Secondary | ICD-10-CM | POA: Insufficient documentation

## 2017-02-20 DIAGNOSIS — Z170 Estrogen receptor positive status [ER+]: Secondary | ICD-10-CM | POA: Diagnosis not present

## 2017-02-20 DIAGNOSIS — C50912 Malignant neoplasm of unspecified site of left female breast: Secondary | ICD-10-CM | POA: Insufficient documentation

## 2017-02-20 DIAGNOSIS — E538 Deficiency of other specified B group vitamins: Secondary | ICD-10-CM | POA: Diagnosis not present

## 2017-02-20 DIAGNOSIS — M810 Age-related osteoporosis without current pathological fracture: Secondary | ICD-10-CM | POA: Insufficient documentation

## 2017-02-20 DIAGNOSIS — E079 Disorder of thyroid, unspecified: Secondary | ICD-10-CM | POA: Insufficient documentation

## 2017-02-20 DIAGNOSIS — I10 Essential (primary) hypertension: Secondary | ICD-10-CM | POA: Diagnosis not present

## 2017-02-20 DIAGNOSIS — D509 Iron deficiency anemia, unspecified: Secondary | ICD-10-CM

## 2017-02-20 DIAGNOSIS — Z9889 Other specified postprocedural states: Secondary | ICD-10-CM | POA: Insufficient documentation

## 2017-02-20 MED ORDER — CYANOCOBALAMIN 1000 MCG/ML IJ SOLN
1000.0000 ug | Freq: Once | INTRAMUSCULAR | Status: AC
  Administered 2017-02-20: 1000 ug via INTRAMUSCULAR

## 2017-02-20 MED ORDER — CYANOCOBALAMIN 1000 MCG/ML IJ SOLN
INTRAMUSCULAR | Status: AC
  Filled 2017-02-20: qty 1

## 2017-02-20 NOTE — Progress Notes (Signed)
Ashley [name] presents today for injection per MD orders. B12 1,046mcg administered IM  in right Upper Arm. Administration without incident. Patient tolerated well. Treatment given per orders. Patient tolerated it well without problems. Vitals stable and discharged home from clinic ambulatory. Follow up as scheduled.

## 2017-02-20 NOTE — Patient Instructions (Signed)
[facility] at [facility] Discharge Instructions  RECOMMENDATIONS MADE BY THE CONSULTANT AND ANY TEST RESULTS WILL BE SENT TO YOUR REFERRING PHYSICIAN.  B12 injection given Follow up as scheduled.  Thank you for choosing [facility] at [facility] to provide your oncology and hematology care.  To afford each patient quality time with our provider, please arrive at least 15 minutes before your scheduled appointment time.    If you have a lab appointment with the Cancer Center please come in thru the  Main Entrance and check in at the main information desk  You need to re-schedule your appointment should you arrive 10 or more minutes late.  We strive to give you quality time with our providers, and arriving late affects you and other patients whose appointments are after yours.  Also, if you no show three or more times for appointments you may be dismissed from the clinic at the providers discretion.     Again, thank you for choosing [facility].  Our hope is that these requests will decrease the amount of time that you wait before being seen by our physicians.       _____________________________________________________________  Should you have questions after your visit to [facility], please contact our office at [phone] between the hours of 8:30 a.m. and 4:30 p.m.  Voicemails left after 4:30 p.m. will not be returned until the following business day.  For prescription refill requests, have your pharmacy contact our office.       Resources For Cancer Patients and their Caregivers ? American Cancer Society: Can assist with transportation, wigs, general needs, runs Look Good Feel Better.        1-888-227-6333 ? Cancer Care: Provides financial assistance, online support groups, medication/co-pay assistance.  1-800-813-HOPE (4673) ? [facility] Assists [place] cancer patients and  their families through emotional , educational and financial support.  336-427-4357 ? [place] DSS Where to apply for food stamps, Medicaid and utility assistance. 336-342-1394 ? [facility]: Transportation to medical appointments. 336-347-2287 ? Social Security Administration: May apply for disability if have a Stage IV cancer. 336-342-7796 1-800-772-1213 ? [place] Aging, Disability and Transit Services: Assists with nutrition, care and transit needs. 336-349-2343  Cancer Center Support Programs: @10RELATIVEDAYS@ > Cancer Support Group  2nd Tuesday of the month 1pm-2pm, Journey Room  > Creative Journey  3rd Tuesday of the month 1130am-1pm, Journey Room  > Look Good Feel Better  1st Wednesday of the month 10am-12 noon, Journey Room (Call American Cancer Society to register 1-800-395-5775)   

## 2017-02-21 DIAGNOSIS — M810 Age-related osteoporosis without current pathological fracture: Secondary | ICD-10-CM | POA: Diagnosis not present

## 2017-02-21 DIAGNOSIS — Z7982 Long term (current) use of aspirin: Secondary | ICD-10-CM | POA: Diagnosis not present

## 2017-02-21 DIAGNOSIS — E539 Vitamin B deficiency, unspecified: Secondary | ICD-10-CM | POA: Diagnosis not present

## 2017-02-21 DIAGNOSIS — Z170 Estrogen receptor positive status [ER+]: Secondary | ICD-10-CM | POA: Diagnosis not present

## 2017-02-21 DIAGNOSIS — I10 Essential (primary) hypertension: Secondary | ICD-10-CM | POA: Diagnosis not present

## 2017-02-21 DIAGNOSIS — L989 Disorder of the skin and subcutaneous tissue, unspecified: Secondary | ICD-10-CM | POA: Diagnosis not present

## 2017-02-21 DIAGNOSIS — Z9981 Dependence on supplemental oxygen: Secondary | ICD-10-CM | POA: Diagnosis not present

## 2017-02-21 DIAGNOSIS — C50912 Malignant neoplasm of unspecified site of left female breast: Secondary | ICD-10-CM | POA: Diagnosis not present

## 2017-02-24 DIAGNOSIS — L989 Disorder of the skin and subcutaneous tissue, unspecified: Secondary | ICD-10-CM | POA: Diagnosis not present

## 2017-02-25 DIAGNOSIS — H52203 Unspecified astigmatism, bilateral: Secondary | ICD-10-CM | POA: Diagnosis not present

## 2017-02-25 DIAGNOSIS — H524 Presbyopia: Secondary | ICD-10-CM | POA: Diagnosis not present

## 2017-02-25 DIAGNOSIS — L989 Disorder of the skin and subcutaneous tissue, unspecified: Secondary | ICD-10-CM | POA: Diagnosis not present

## 2017-02-25 DIAGNOSIS — Z170 Estrogen receptor positive status [ER+]: Secondary | ICD-10-CM | POA: Diagnosis not present

## 2017-02-25 DIAGNOSIS — Z9981 Dependence on supplemental oxygen: Secondary | ICD-10-CM | POA: Diagnosis not present

## 2017-02-25 DIAGNOSIS — Z7982 Long term (current) use of aspirin: Secondary | ICD-10-CM | POA: Diagnosis not present

## 2017-02-25 DIAGNOSIS — E539 Vitamin B deficiency, unspecified: Secondary | ICD-10-CM | POA: Diagnosis not present

## 2017-02-25 DIAGNOSIS — C50912 Malignant neoplasm of unspecified site of left female breast: Secondary | ICD-10-CM | POA: Diagnosis not present

## 2017-02-25 DIAGNOSIS — M810 Age-related osteoporosis without current pathological fracture: Secondary | ICD-10-CM | POA: Diagnosis not present

## 2017-02-25 DIAGNOSIS — I10 Essential (primary) hypertension: Secondary | ICD-10-CM | POA: Diagnosis not present

## 2017-02-26 DIAGNOSIS — C50912 Malignant neoplasm of unspecified site of left female breast: Secondary | ICD-10-CM | POA: Diagnosis not present

## 2017-02-27 DIAGNOSIS — E539 Vitamin B deficiency, unspecified: Secondary | ICD-10-CM | POA: Diagnosis not present

## 2017-02-27 DIAGNOSIS — C50912 Malignant neoplasm of unspecified site of left female breast: Secondary | ICD-10-CM | POA: Diagnosis not present

## 2017-02-27 DIAGNOSIS — L989 Disorder of the skin and subcutaneous tissue, unspecified: Secondary | ICD-10-CM | POA: Diagnosis not present

## 2017-02-27 DIAGNOSIS — Z9981 Dependence on supplemental oxygen: Secondary | ICD-10-CM | POA: Diagnosis not present

## 2017-02-27 DIAGNOSIS — Z7982 Long term (current) use of aspirin: Secondary | ICD-10-CM | POA: Diagnosis not present

## 2017-02-27 DIAGNOSIS — M810 Age-related osteoporosis without current pathological fracture: Secondary | ICD-10-CM | POA: Diagnosis not present

## 2017-02-27 DIAGNOSIS — I10 Essential (primary) hypertension: Secondary | ICD-10-CM | POA: Diagnosis not present

## 2017-02-27 DIAGNOSIS — Z170 Estrogen receptor positive status [ER+]: Secondary | ICD-10-CM | POA: Diagnosis not present

## 2017-03-03 DIAGNOSIS — Z170 Estrogen receptor positive status [ER+]: Secondary | ICD-10-CM | POA: Diagnosis not present

## 2017-03-03 DIAGNOSIS — L989 Disorder of the skin and subcutaneous tissue, unspecified: Secondary | ICD-10-CM | POA: Diagnosis not present

## 2017-03-03 DIAGNOSIS — C50912 Malignant neoplasm of unspecified site of left female breast: Secondary | ICD-10-CM | POA: Diagnosis not present

## 2017-03-03 DIAGNOSIS — I10 Essential (primary) hypertension: Secondary | ICD-10-CM | POA: Diagnosis not present

## 2017-03-03 DIAGNOSIS — Z9981 Dependence on supplemental oxygen: Secondary | ICD-10-CM | POA: Diagnosis not present

## 2017-03-03 DIAGNOSIS — E539 Vitamin B deficiency, unspecified: Secondary | ICD-10-CM | POA: Diagnosis not present

## 2017-03-03 DIAGNOSIS — Z7982 Long term (current) use of aspirin: Secondary | ICD-10-CM | POA: Diagnosis not present

## 2017-03-03 DIAGNOSIS — M810 Age-related osteoporosis without current pathological fracture: Secondary | ICD-10-CM | POA: Diagnosis not present

## 2017-03-04 DIAGNOSIS — Z0001 Encounter for general adult medical examination with abnormal findings: Secondary | ICD-10-CM | POA: Diagnosis not present

## 2017-03-04 DIAGNOSIS — Z1389 Encounter for screening for other disorder: Secondary | ICD-10-CM | POA: Diagnosis not present

## 2017-03-04 DIAGNOSIS — M40209 Unspecified kyphosis, site unspecified: Secondary | ICD-10-CM | POA: Diagnosis not present

## 2017-03-04 DIAGNOSIS — G894 Chronic pain syndrome: Secondary | ICD-10-CM | POA: Diagnosis not present

## 2017-03-04 DIAGNOSIS — M1991 Primary osteoarthritis, unspecified site: Secondary | ICD-10-CM | POA: Diagnosis not present

## 2017-03-04 DIAGNOSIS — E063 Autoimmune thyroiditis: Secondary | ICD-10-CM | POA: Diagnosis not present

## 2017-03-05 DIAGNOSIS — C50912 Malignant neoplasm of unspecified site of left female breast: Secondary | ICD-10-CM | POA: Diagnosis not present

## 2017-03-05 DIAGNOSIS — Z7982 Long term (current) use of aspirin: Secondary | ICD-10-CM | POA: Diagnosis not present

## 2017-03-05 DIAGNOSIS — E539 Vitamin B deficiency, unspecified: Secondary | ICD-10-CM | POA: Diagnosis not present

## 2017-03-05 DIAGNOSIS — Z9981 Dependence on supplemental oxygen: Secondary | ICD-10-CM | POA: Diagnosis not present

## 2017-03-05 DIAGNOSIS — I10 Essential (primary) hypertension: Secondary | ICD-10-CM | POA: Diagnosis not present

## 2017-03-05 DIAGNOSIS — L989 Disorder of the skin and subcutaneous tissue, unspecified: Secondary | ICD-10-CM | POA: Diagnosis not present

## 2017-03-05 DIAGNOSIS — Z170 Estrogen receptor positive status [ER+]: Secondary | ICD-10-CM | POA: Diagnosis not present

## 2017-03-05 DIAGNOSIS — M810 Age-related osteoporosis without current pathological fracture: Secondary | ICD-10-CM | POA: Diagnosis not present

## 2017-03-11 DIAGNOSIS — Z7982 Long term (current) use of aspirin: Secondary | ICD-10-CM | POA: Diagnosis not present

## 2017-03-11 DIAGNOSIS — Z170 Estrogen receptor positive status [ER+]: Secondary | ICD-10-CM | POA: Diagnosis not present

## 2017-03-11 DIAGNOSIS — Z9981 Dependence on supplemental oxygen: Secondary | ICD-10-CM | POA: Diagnosis not present

## 2017-03-11 DIAGNOSIS — M810 Age-related osteoporosis without current pathological fracture: Secondary | ICD-10-CM | POA: Diagnosis not present

## 2017-03-11 DIAGNOSIS — C50912 Malignant neoplasm of unspecified site of left female breast: Secondary | ICD-10-CM | POA: Diagnosis not present

## 2017-03-11 DIAGNOSIS — I10 Essential (primary) hypertension: Secondary | ICD-10-CM | POA: Diagnosis not present

## 2017-03-11 DIAGNOSIS — E539 Vitamin B deficiency, unspecified: Secondary | ICD-10-CM | POA: Diagnosis not present

## 2017-03-11 DIAGNOSIS — L989 Disorder of the skin and subcutaneous tissue, unspecified: Secondary | ICD-10-CM | POA: Diagnosis not present

## 2017-03-18 DIAGNOSIS — I10 Essential (primary) hypertension: Secondary | ICD-10-CM | POA: Diagnosis not present

## 2017-03-18 DIAGNOSIS — Z7982 Long term (current) use of aspirin: Secondary | ICD-10-CM | POA: Diagnosis not present

## 2017-03-18 DIAGNOSIS — C50912 Malignant neoplasm of unspecified site of left female breast: Secondary | ICD-10-CM | POA: Diagnosis not present

## 2017-03-18 DIAGNOSIS — Z9981 Dependence on supplemental oxygen: Secondary | ICD-10-CM | POA: Diagnosis not present

## 2017-03-18 DIAGNOSIS — E539 Vitamin B deficiency, unspecified: Secondary | ICD-10-CM | POA: Diagnosis not present

## 2017-03-18 DIAGNOSIS — Z170 Estrogen receptor positive status [ER+]: Secondary | ICD-10-CM | POA: Diagnosis not present

## 2017-03-18 DIAGNOSIS — L989 Disorder of the skin and subcutaneous tissue, unspecified: Secondary | ICD-10-CM | POA: Diagnosis not present

## 2017-03-18 DIAGNOSIS — M810 Age-related osteoporosis without current pathological fracture: Secondary | ICD-10-CM | POA: Diagnosis not present

## 2017-03-21 ENCOUNTER — Encounter (HOSPITAL_COMMUNITY): Payer: Self-pay

## 2017-03-21 ENCOUNTER — Encounter (HOSPITAL_COMMUNITY): Payer: Medicare Other | Attending: Oncology

## 2017-03-21 VITALS — BP 130/57 | HR 98 | Temp 97.6°F | Resp 16

## 2017-03-21 DIAGNOSIS — R0602 Shortness of breath: Secondary | ICD-10-CM | POA: Diagnosis not present

## 2017-03-21 DIAGNOSIS — J9610 Chronic respiratory failure, unspecified whether with hypoxia or hypercapnia: Secondary | ICD-10-CM | POA: Diagnosis not present

## 2017-03-21 DIAGNOSIS — D509 Iron deficiency anemia, unspecified: Secondary | ICD-10-CM

## 2017-03-21 DIAGNOSIS — Z9049 Acquired absence of other specified parts of digestive tract: Secondary | ICD-10-CM | POA: Insufficient documentation

## 2017-03-21 DIAGNOSIS — D61818 Other pancytopenia: Secondary | ICD-10-CM | POA: Insufficient documentation

## 2017-03-21 DIAGNOSIS — Z170 Estrogen receptor positive status [ER+]: Secondary | ICD-10-CM | POA: Insufficient documentation

## 2017-03-21 DIAGNOSIS — E538 Deficiency of other specified B group vitamins: Secondary | ICD-10-CM

## 2017-03-21 DIAGNOSIS — I10 Essential (primary) hypertension: Secondary | ICD-10-CM | POA: Insufficient documentation

## 2017-03-21 DIAGNOSIS — M810 Age-related osteoporosis without current pathological fracture: Secondary | ICD-10-CM | POA: Insufficient documentation

## 2017-03-21 DIAGNOSIS — C50912 Malignant neoplasm of unspecified site of left female breast: Secondary | ICD-10-CM | POA: Insufficient documentation

## 2017-03-21 DIAGNOSIS — R062 Wheezing: Secondary | ICD-10-CM | POA: Diagnosis not present

## 2017-03-21 DIAGNOSIS — Z9889 Other specified postprocedural states: Secondary | ICD-10-CM | POA: Insufficient documentation

## 2017-03-21 DIAGNOSIS — M40209 Unspecified kyphosis, site unspecified: Secondary | ICD-10-CM | POA: Insufficient documentation

## 2017-03-21 DIAGNOSIS — E079 Disorder of thyroid, unspecified: Secondary | ICD-10-CM | POA: Insufficient documentation

## 2017-03-21 MED ORDER — CYANOCOBALAMIN 1000 MCG/ML IJ SOLN
1000.0000 ug | Freq: Once | INTRAMUSCULAR | Status: AC
  Administered 2017-03-21: 1000 ug via INTRAMUSCULAR

## 2017-03-21 MED ORDER — CYANOCOBALAMIN 1000 MCG/ML IJ SOLN
INTRAMUSCULAR | Status: AC
  Filled 2017-03-21: qty 1

## 2017-03-21 NOTE — Progress Notes (Signed)
Patient received vitamin b12 shot today with no complaints voiced.  Injection site clean and dry with no bruising or swelling noted at site.  Band aid applied.  VSS with discharge and left via wheelchair with family.  No s/s of distress noted.

## 2017-03-21 NOTE — Patient Instructions (Signed)
[place] at [facility]  Discharge Instructions:  b12 shot today.  Keep all scheduled appointments and call for any concerns or questions.  _______________________________________________________________  Thank you for choosing [place] at [facility] to provide your oncology and hematology care.  To afford each patient quality time with our providers, please arrive at least 15 minutes before your scheduled appointment.  You need to re-schedule your appointment if you arrive 10 or more minutes late.  We strive to give you quality time with our providers, and arriving late affects you and other patients whose appointments are after yours.  Also, if you no show three or more times for appointments you may be dismissed from the clinic.  Again, thank you for choosing [place] at [place] hope is that these requests will allow you access to exceptional care and in a timely manner. _______________________________________________________________  If you have questions after your visit, please contact our office at [phone] between the hours of 8:30 a.m. and 5:00 p.m. Voicemails left after 4:30 p.m. will not be returned until the following business day. _______________________________________________________________  For prescription refill requests, have your pharmacy contact our office. _______________________________________________________________  Recommendations made by the consultant and any test results will be sent to your referring physician. _______________________________________________________________

## 2017-03-25 DIAGNOSIS — Z170 Estrogen receptor positive status [ER+]: Secondary | ICD-10-CM | POA: Diagnosis not present

## 2017-03-25 DIAGNOSIS — M810 Age-related osteoporosis without current pathological fracture: Secondary | ICD-10-CM | POA: Diagnosis not present

## 2017-03-25 DIAGNOSIS — Z9981 Dependence on supplemental oxygen: Secondary | ICD-10-CM | POA: Diagnosis not present

## 2017-03-25 DIAGNOSIS — I10 Essential (primary) hypertension: Secondary | ICD-10-CM | POA: Diagnosis not present

## 2017-03-25 DIAGNOSIS — L989 Disorder of the skin and subcutaneous tissue, unspecified: Secondary | ICD-10-CM | POA: Diagnosis not present

## 2017-03-25 DIAGNOSIS — C50912 Malignant neoplasm of unspecified site of left female breast: Secondary | ICD-10-CM | POA: Diagnosis not present

## 2017-03-25 DIAGNOSIS — Z7982 Long term (current) use of aspirin: Secondary | ICD-10-CM | POA: Diagnosis not present

## 2017-03-25 DIAGNOSIS — E539 Vitamin B deficiency, unspecified: Secondary | ICD-10-CM | POA: Diagnosis not present

## 2017-04-01 DIAGNOSIS — C50912 Malignant neoplasm of unspecified site of left female breast: Secondary | ICD-10-CM | POA: Diagnosis not present

## 2017-04-01 DIAGNOSIS — Z9981 Dependence on supplemental oxygen: Secondary | ICD-10-CM | POA: Diagnosis not present

## 2017-04-01 DIAGNOSIS — L989 Disorder of the skin and subcutaneous tissue, unspecified: Secondary | ICD-10-CM | POA: Diagnosis not present

## 2017-04-01 DIAGNOSIS — M810 Age-related osteoporosis without current pathological fracture: Secondary | ICD-10-CM | POA: Diagnosis not present

## 2017-04-01 DIAGNOSIS — I10 Essential (primary) hypertension: Secondary | ICD-10-CM | POA: Diagnosis not present

## 2017-04-01 DIAGNOSIS — Z7982 Long term (current) use of aspirin: Secondary | ICD-10-CM | POA: Diagnosis not present

## 2017-04-01 DIAGNOSIS — Z170 Estrogen receptor positive status [ER+]: Secondary | ICD-10-CM | POA: Diagnosis not present

## 2017-04-01 DIAGNOSIS — E539 Vitamin B deficiency, unspecified: Secondary | ICD-10-CM | POA: Diagnosis not present

## 2017-04-08 DIAGNOSIS — Z7982 Long term (current) use of aspirin: Secondary | ICD-10-CM | POA: Diagnosis not present

## 2017-04-08 DIAGNOSIS — L989 Disorder of the skin and subcutaneous tissue, unspecified: Secondary | ICD-10-CM | POA: Diagnosis not present

## 2017-04-08 DIAGNOSIS — M810 Age-related osteoporosis without current pathological fracture: Secondary | ICD-10-CM | POA: Diagnosis not present

## 2017-04-08 DIAGNOSIS — E539 Vitamin B deficiency, unspecified: Secondary | ICD-10-CM | POA: Diagnosis not present

## 2017-04-08 DIAGNOSIS — C50912 Malignant neoplasm of unspecified site of left female breast: Secondary | ICD-10-CM | POA: Diagnosis not present

## 2017-04-08 DIAGNOSIS — Z170 Estrogen receptor positive status [ER+]: Secondary | ICD-10-CM | POA: Diagnosis not present

## 2017-04-08 DIAGNOSIS — I10 Essential (primary) hypertension: Secondary | ICD-10-CM | POA: Diagnosis not present

## 2017-04-08 DIAGNOSIS — Z9981 Dependence on supplemental oxygen: Secondary | ICD-10-CM | POA: Diagnosis not present

## 2017-04-10 DIAGNOSIS — C50912 Malignant neoplasm of unspecified site of left female breast: Secondary | ICD-10-CM | POA: Diagnosis not present

## 2017-04-10 DIAGNOSIS — I10 Essential (primary) hypertension: Secondary | ICD-10-CM | POA: Diagnosis not present

## 2017-04-10 DIAGNOSIS — Z7982 Long term (current) use of aspirin: Secondary | ICD-10-CM | POA: Diagnosis not present

## 2017-04-10 DIAGNOSIS — L989 Disorder of the skin and subcutaneous tissue, unspecified: Secondary | ICD-10-CM | POA: Diagnosis not present

## 2017-04-10 DIAGNOSIS — E539 Vitamin B deficiency, unspecified: Secondary | ICD-10-CM | POA: Diagnosis not present

## 2017-04-10 DIAGNOSIS — M810 Age-related osteoporosis without current pathological fracture: Secondary | ICD-10-CM | POA: Diagnosis not present

## 2017-04-10 DIAGNOSIS — Z9981 Dependence on supplemental oxygen: Secondary | ICD-10-CM | POA: Diagnosis not present

## 2017-04-10 DIAGNOSIS — Z170 Estrogen receptor positive status [ER+]: Secondary | ICD-10-CM | POA: Diagnosis not present

## 2017-04-13 ENCOUNTER — Other Ambulatory Visit: Payer: Self-pay

## 2017-04-13 ENCOUNTER — Encounter (HOSPITAL_COMMUNITY): Payer: Self-pay | Admitting: Emergency Medicine

## 2017-04-13 ENCOUNTER — Observation Stay (HOSPITAL_COMMUNITY)
Admission: EM | Admit: 2017-04-13 | Discharge: 2017-04-15 | Disposition: A | Discharge status: Home / Self Care | Payer: Medicare Other | Attending: Internal Medicine | Admitting: Internal Medicine

## 2017-04-13 ENCOUNTER — Emergency Department (HOSPITAL_COMMUNITY): Payer: Medicare Other

## 2017-04-13 DIAGNOSIS — B372 Candidiasis of skin and nail: Secondary | ICD-10-CM | POA: Diagnosis present

## 2017-04-13 DIAGNOSIS — Z7982 Long term (current) use of aspirin: Secondary | ICD-10-CM | POA: Insufficient documentation

## 2017-04-13 DIAGNOSIS — D696 Thrombocytopenia, unspecified: Secondary | ICD-10-CM | POA: Diagnosis present

## 2017-04-13 DIAGNOSIS — J9811 Atelectasis: Secondary | ICD-10-CM | POA: Diagnosis not present

## 2017-04-13 DIAGNOSIS — Z9049 Acquired absence of other specified parts of digestive tract: Secondary | ICD-10-CM | POA: Diagnosis not present

## 2017-04-13 DIAGNOSIS — L0390 Cellulitis, unspecified: Secondary | ICD-10-CM | POA: Diagnosis present

## 2017-04-13 DIAGNOSIS — M7989 Other specified soft tissue disorders: Secondary | ICD-10-CM | POA: Diagnosis not present

## 2017-04-13 DIAGNOSIS — E038 Other specified hypothyroidism: Secondary | ICD-10-CM | POA: Diagnosis not present

## 2017-04-13 DIAGNOSIS — I10 Essential (primary) hypertension: Secondary | ICD-10-CM | POA: Diagnosis not present

## 2017-04-13 DIAGNOSIS — Z79899 Other long term (current) drug therapy: Secondary | ICD-10-CM | POA: Diagnosis not present

## 2017-04-13 DIAGNOSIS — L8990 Pressure ulcer of unspecified site, unspecified stage: Secondary | ICD-10-CM

## 2017-04-13 DIAGNOSIS — R4182 Altered mental status, unspecified: Secondary | ICD-10-CM | POA: Diagnosis present

## 2017-04-13 DIAGNOSIS — R443 Hallucinations, unspecified: Secondary | ICD-10-CM | POA: Diagnosis present

## 2017-04-13 DIAGNOSIS — F419 Anxiety disorder, unspecified: Secondary | ICD-10-CM | POA: Diagnosis not present

## 2017-04-13 DIAGNOSIS — L03116 Cellulitis of left lower limb: Principal | ICD-10-CM

## 2017-04-13 DIAGNOSIS — Z853 Personal history of malignant neoplasm of breast: Secondary | ICD-10-CM | POA: Insufficient documentation

## 2017-04-13 DIAGNOSIS — R410 Disorientation, unspecified: Secondary | ICD-10-CM | POA: Diagnosis present

## 2017-04-13 DIAGNOSIS — R4020 Unspecified coma: Secondary | ICD-10-CM | POA: Diagnosis not present

## 2017-04-13 DIAGNOSIS — E039 Hypothyroidism, unspecified: Secondary | ICD-10-CM | POA: Diagnosis present

## 2017-04-13 LAB — COMPREHENSIVE METABOLIC PANEL
ALBUMIN: 3.3 g/dL — AB (ref 3.5–5.0)
ALT: 16 U/L (ref 14–54)
ANION GAP: 5 (ref 5–15)
AST: 20 U/L (ref 15–41)
Alkaline Phosphatase: 61 U/L (ref 38–126)
BUN: 22 mg/dL — ABNORMAL HIGH (ref 6–20)
CALCIUM: 9.7 mg/dL (ref 8.9–10.3)
CHLORIDE: 98 mmol/L — AB (ref 101–111)
CO2: 36 mmol/L — AB (ref 22–32)
Creatinine, Ser: 0.82 mg/dL (ref 0.44–1.00)
GFR calc Af Amer: >60 mL/min (ref >60)
GFR calc non Af Amer: >60 mL/min (ref >60)
Glucose, Bld: 103 mg/dL — ABNORMAL HIGH (ref 65–99)
POTASSIUM: 4.2 mmol/L (ref 3.5–5.1)
SODIUM: 139 mmol/L (ref 135–145)
Total Bilirubin: 1.1 mg/dL (ref 0.3–1.2)
Total Protein: 6.5 g/dL (ref 6.5–8.1)

## 2017-04-13 LAB — URINALYSIS, ROUTINE W REFLEX MICROSCOPIC
BILIRUBIN URINE: NEGATIVE
Glucose, UA: NEGATIVE mg/dL
KETONES UR: NEGATIVE mg/dL
Nitrite: NEGATIVE
PH: 5 (ref 5.0–8.0)
Protein, ur: NEGATIVE mg/dL
Specific Gravity, Urine: 1.011 (ref 1.005–1.030)

## 2017-04-13 LAB — CBC WITH DIFFERENTIAL/PLATELET
BASOS PCT: 0 %
Basophils Absolute: 0 10*3/uL (ref 0.0–0.1)
EOS ABS: 0.1 10*3/uL (ref 0.0–0.7)
Eosinophils Relative: 1 %
HCT: 41.2 % (ref 36.0–46.0)
HEMOGLOBIN: 12.6 g/dL (ref 12.0–15.0)
Lymphocytes Relative: 13 %
Lymphs Abs: 0.8 10*3/uL (ref 0.7–4.0)
MCH: 28.8 pg (ref 26.0–34.0)
MCHC: 30.6 g/dL (ref 30.0–36.0)
MCV: 94.1 fL (ref 78.0–100.0)
MONO ABS: 0.5 10*3/uL (ref 0.1–1.0)
MONOS PCT: 8 %
NEUTROS PCT: 78 %
Neutro Abs: 4.9 10*3/uL (ref 1.7–7.7)
Platelets: 142 10*3/uL — ABNORMAL LOW (ref 150–400)
RBC: 4.38 MIL/uL (ref 3.87–5.11)
RDW: 14.8 % (ref 11.5–15.5)
WBC: 6.3 10*3/uL (ref 4.0–10.5)

## 2017-04-13 LAB — RAPID URINE DRUG SCREEN, HOSP PERFORMED
Amphetamines: NOT DETECTED
BARBITURATES: NOT DETECTED
BENZODIAZEPINES: NOT DETECTED
COCAINE: NOT DETECTED
Opiates: POSITIVE — AB
Tetrahydrocannabinol: NOT DETECTED

## 2017-04-13 LAB — TSH: TSH: 1.762 u[IU]/mL (ref 0.350–4.500)

## 2017-04-13 MED ORDER — CEFAZOLIN SODIUM-DEXTROSE 1-4 GM/50ML-% IV SOLN
1.0000 g | Freq: Three times a day (TID) | INTRAVENOUS | Status: DC
  Administered 2017-04-13 – 2017-04-15 (×6): 1 g via INTRAVENOUS
  Filled 2017-04-13 (×10): qty 50

## 2017-04-13 MED ORDER — TRIAMCINOLONE ACETONIDE 0.1 % EX CREA
1.0000 "application " | TOPICAL_CREAM | Freq: Two times a day (BID) | CUTANEOUS | Status: DC
  Administered 2017-04-13: 1 via TOPICAL
  Filled 2017-04-13: qty 15

## 2017-04-13 MED ORDER — METOPROLOL SUCCINATE ER 25 MG PO TB24
25.0000 mg | ORAL_TABLET | Freq: Every day | ORAL | Status: DC
  Administered 2017-04-13: 25 mg via ORAL
  Filled 2017-04-13: qty 1

## 2017-04-13 MED ORDER — ONDANSETRON HCL 4 MG PO TABS: 4.0000 mg | ORAL_TABLET | Freq: Four times a day (QID) | ORAL | Status: DC | PRN

## 2017-04-13 MED ORDER — ACETAMINOPHEN 325 MG PO TABS
650.0000 mg | ORAL_TABLET | Freq: Four times a day (QID) | ORAL | Status: DC | PRN
  Administered 2017-04-13 – 2017-04-15 (×4): 650 mg via ORAL
  Filled 2017-04-13 (×4): qty 2

## 2017-04-13 MED ORDER — NYSTATIN 100000 UNIT/GM EX POWD
Freq: Three times a day (TID) | CUTANEOUS | Status: DC
  Administered 2017-04-13 – 2017-04-15 (×7): via TOPICAL
  Filled 2017-04-13 (×2): qty 15

## 2017-04-13 MED ORDER — CEFAZOLIN SODIUM-DEXTROSE 1-4 GM/50ML-% IV SOLN
1.0000 g | Freq: Once | INTRAVENOUS | Status: AC
  Administered 2017-04-13: 1 g via INTRAVENOUS
  Filled 2017-04-13: qty 50

## 2017-04-13 MED ORDER — ACETAMINOPHEN 650 MG RE SUPP: 650.0000 mg | Freq: Four times a day (QID) | RECTAL | Status: DC | PRN

## 2017-04-13 MED ORDER — FERROUS SULFATE 325 (65 FE) MG PO TABS
325.0000 mg | ORAL_TABLET | Freq: Every day | ORAL | Status: DC
  Administered 2017-04-13 – 2017-04-15 (×3): 325 mg via ORAL
  Filled 2017-04-13 (×3): qty 1

## 2017-04-13 MED ORDER — ALBUTEROL SULFATE (2.5 MG/3ML) 0.083% IN NEBU: 2.5000 mg | INHALATION_SOLUTION | RESPIRATORY_TRACT | Status: DC | PRN

## 2017-04-13 MED ORDER — ENOXAPARIN SODIUM 30 MG/0.3ML ~~LOC~~ SOLN
30.0000 mg | SUBCUTANEOUS | Status: DC
  Administered 2017-04-13 – 2017-04-15 (×3): 30 mg via SUBCUTANEOUS
  Filled 2017-04-13 (×3): qty 0.3

## 2017-04-13 MED ORDER — ASPIRIN EC 81 MG PO TBEC
81.0000 mg | DELAYED_RELEASE_TABLET | Freq: Every day | ORAL | Status: DC
  Administered 2017-04-13 – 2017-04-15 (×3): 81 mg via ORAL
  Filled 2017-04-13 (×3): qty 1

## 2017-04-13 MED ORDER — ONDANSETRON HCL 4 MG/2ML IJ SOLN: 4.0000 mg | Freq: Four times a day (QID) | INTRAMUSCULAR | Status: DC | PRN

## 2017-04-13 MED ORDER — SODIUM CHLORIDE 0.9 % IV SOLN
INTRAVENOUS | Status: DC
  Administered 2017-04-13 – 2017-04-14 (×3): via INTRAVENOUS

## 2017-04-13 MED ORDER — LEVOTHYROXINE SODIUM 50 MCG PO TABS
75.0000 ug | ORAL_TABLET | Freq: Every day | ORAL | Status: DC
  Administered 2017-04-13 – 2017-04-15 (×3): 75 ug via ORAL
  Filled 2017-04-13 (×3): qty 2

## 2017-04-13 MED ORDER — PANTOPRAZOLE SODIUM 40 MG PO TBEC
40.0000 mg | DELAYED_RELEASE_TABLET | Freq: Every day | ORAL | Status: DC
  Administered 2017-04-13 – 2017-04-15 (×3): 40 mg via ORAL
  Filled 2017-04-13 (×3): qty 1

## 2017-04-13 NOTE — ED Notes (Signed)
[place] are not caregivers.  They are her neighbors.

## 2017-04-13 NOTE — H&P (Signed)
History and Physical    Ashley [name]:[id number] DOB: 1932-01-24 DOA: 04/13/2017  Referring MD/NP/PA: Dr. Brantley Stage PCP: Redmond School, MD  Patient coming from: home via EMS with  Chief Complaint: Altered Mental status  HPI: Ashley [name] is a 83 y.o. female with medical history significant of chronic anemia, hypothyroidism, vitamin B12 deficiency, thrombocytopenia, GERD, and H/O breast cancer; who presents after calling the [facility] department after being found to be altered.  At baseline patient lives alone and family reportedly live [place] of state.  Patient had called earlier yesterday stating someone was trying to break into her house with snakes.  Once they arrived they evaluated the premises, but found no such intruder. A nearby neighbor was able to sit with her for most of the day.  However patient called again for help  with complaints of seeing little people in her water heater.  Patient notes complaints of left lower extremity swelling and redness.  Home health has been coming out to wrap her legs but they normally do not come on the weekends.  Lastly, she has had lower abdominal skin rash with drainage.  She reports that she has been putting some creams on the rash without relief of symptoms.  Patient had similar admission and 12/2016 for which she was noted have hallucinations but was suspected to be related to cellulitis for which she was treated with vancomycin initially and switched over to Keflex.  Patient was also advised to discontinue hydrocodone and Xanax at that time.  Patient notes that she still takes hydrocodone but is no longer prescribe Xanax.  ED Course: Upon admission into the emergency department patient was noted to be afebrile with vital signs relatively within normal limits.  Labs revealed CBC near patient baseline, sodium 139, potassium 4.2, chloride 98, CO2 36, BUN 22, creatinine 0.82, glucose 103.  Patient was started on Ancef for suspected  cellulitis.  Review of Systems  Constitutional: Negative for chills, fever and weight loss.  HENT: Positive for hearing loss. Negative for nosebleeds.   Eyes: Negative for photophobia and pain.  Respiratory: Negative for cough and shortness of breath.   Cardiovascular: Positive for leg swelling. Negative for chest pain.  Gastrointestinal: Negative for abdominal pain, diarrhea and vomiting.  Genitourinary: Negative for dysuria and frequency.  Musculoskeletal: Negative for falls.  Skin: Positive for rash.       Positive for skin change in color  Neurological: Negative for speech change and focal weakness.  Psychiatric/Behavioral: Positive for hallucinations.    Past Medical History:  Diagnosis Date  . Acid reflux   . Arthritis   . At risk for falls   . B12 deficiency 10/17/2016  . B12 deficiency 10/17/2016  . Back pain   . Bilateral ovarian cysts 02/20/2012  . Cancer ([place])   . Edema   . Hypertension   . Left-sided breast cancer 11/14/2011   Started Arimidex on 07/25/2009. Stage IB, grade 1, ER 99%, PR 100%, Her2 negative, left-sided breast cancer, 8 mm in size, S/P lumpectomy on 04/24/2010, and sentinel node biopsy.  Ki-67 marker 6%.  . Osteoporosis 03/06/2012   Switched from Arimidex to Tamoxifen as a result of osteoporosis   . Other pancytopenia ([place]) 05/29/2014  . Thyroid disease     Past Surgical History:  Procedure Laterality Date  . APPENDECTOMY    . BREAST SURGERY    . CHOLECYSTECTOMY    . HERNIA REPAIR    . HIP FRACTURE SURGERY    . TONSILLECTOMY    .  TOTAL KNEE REVISION       reports that  has never smoked. she has never used smokeless tobacco. She reports that she does not drink alcohol or use drugs.  Allergies  Allergen Reactions  . Ciprofloxacin Hives and Itching  . Codeine Phosphate Other (See Comments)    Reaction:  Unknown   . Doxycycline Hives and Itching  . Erythromycin Ethylsuccinate Other (See Comments)    Reaction:  Unknown   . Levofloxacin Other  (See Comments)    Reaction:  Unknown   . Penicillins Other (See Comments)    Reaction:  Unknown  Has patient had a PCN reaction causing immediate rash, facial/tongue/throat swelling, SOB or lightheadedness with hypotension: Unsure Has patient had a PCN reaction causing severe rash involving mucus membranes or skin necrosis: Unsure Has patient had a PCN reaction that required hospitalization Unsure Has patient had a PCN reaction occurring within the last 10 years: No If all of the above answers are "NO", then may proceed with Cephalosporin use.  . Sulfamethoxazole Other (See Comments)    Reaction:  Unknown   . Tetracycline Hcl Other (See Comments)    Reaction:  Unknown   . Lidocaine Other (See Comments)    Reaction:  Unknown     No family history on file.  Prior to Admission medications   Medication Sig Start Date End Date Taking? Authorizing Provider  ALPRAZolam Duanne Moron) 0.5 MG tablet  12/30/16   [provider]  aspirin EC 81 MG tablet Take 81 mg by mouth daily with lunch.    [provider]  Cholecalciferol 2000 units TABS Take 1 tablet by mouth daily.    [provider]  ferrous sulfate 325 (65 FE) MG tablet Take 1 tablet (325 mg total) by mouth daily. 03/06/16   Dorie Rank, MD  HYDROcodone-acetaminophen [facility]) 10-325 MG tablet Take 1 tablet by mouth 4 (four) times daily. 12/26/16   [provider]  levothyroxine (SYNTHROID, LEVOTHROID) 75 MCG tablet Take 75 mcg by mouth daily before breakfast.    [provider]  metoprolol succinate (TOPROL-XL) 25 MG 24 hr tablet Take 1 tablet (25 mg total) by mouth daily. 03/05/16   Thurnell Lose, MD  nitroGLYCERIN (NITROSTAT) 0.4 MG SL tablet Place 0.4 mg under the tongue every 5 (five) minutes as needed for chest pain.     [provider]  nystatin (MYCOSTATIN/NYSTOP) powder Apply topically 3 (three) times daily. 01/03/17   Isaac Bliss, Rayford Halsted, MD  omeprazole (PRILOSEC) 20 MG capsule  Take 20 mg by mouth daily.     [provider]  potassium chloride SA (K-DUR,KLOR-CON) 20 MEQ tablet Take 20 mEq by mouth daily.    [provider]  silver sulfADIAZINE (SILVADENE) 1 % cream APPLY TO AFFECTED AREAS THREE TIMES DAILY. 10/11/16   Florian Buff, MD  triamcinolone cream (KENALOG) 0.1 % Apply 1 application topically 2 (two) times daily. Pt applies to legs.    [provider]    Physical Exam:  Constitutional: Elderly female who appears to be alert and in no acute distress Vitals:   04/13/17 0100 04/13/17 0115 04/13/17 0145 04/13/17 0200  BP:      Pulse: 66  74   Resp: '16 18  18  ' Temp:      TempSrc:      SpO2: 92%  97%   Weight:      Height:       Eyes: PERRL, lids and conjunctivae normal ENMT: Mucous membranes are  dry. Posterior pharynx clear of any exudate or lesions.  Neck: normal, supple, no masses, no thyromegaly Respiratory: clear to auscultation bilaterally, no wheezing, no crackles. Normal respiratory effort. No accessory muscle use.  Cardiovascular: Regular rate and rhythm, no murmurs / rubs / gallops. No extremity edema. 2+ pedal pulses. No carotid bruits.  Abdomen: no tenderness, no masses palpated. No hepatosplenomegaly. Bowel sounds positive.  Musculoskeletal: no clubbing / cyanosis. No joint deformity upper and lower extremities. Good ROM, no contractures. Normal muscle tone.  Skin: Erythema noted of the left leg tracing up to the thigh.  Right leg is wrapped.  Lower abdomen has erythematous rash with yeast-like smell present. Neurologic: CN 2-12 grossly intact. Sensation intact, DTR normal. Strength 5/5 in all 4.  Psychiatric: Normal judgment and insight. Alert and oriented x 3. Normal mood.     Labs on Admission: I have personally reviewed following labs and imaging studies  CBC: Recent Labs  Lab 04/13/17 0059  WBC 6.3  NEUTROABS 4.9  HGB 12.6  HCT 41.2  MCV 94.1  PLT 093*   Basic Metabolic Panel: Recent Labs  Lab  04/13/17 0059  NA 139  K 4.2  CL 98*  CO2 36*  GLUCOSE 103*  BUN 22*  CREATININE 0.82  CALCIUM 9.7   GFR: Estimated Creatinine Clearance: 27.5 mL/min (by C-G formula based on SCr of 0.82 mg/dL). Liver Function Tests: Recent Labs  Lab 04/13/17 0059  AST 20  ALT 16  ALKPHOS 61  BILITOT 1.1  PROT 6.5  ALBUMIN 3.3*   No results for input(s): LIPASE, AMYLASE in the last 168 hours. No results for input(s): AMMONIA in the last 168 hours. Coagulation Profile: No results for input(s): INR, PROTIME in the last 168 hours. Cardiac Enzymes: No results for input(s): CKTOTAL, CKMB, CKMBINDEX, TROPONINI in the last 168 hours. BNP (last 3 results) No results for input(s): PROBNP in the last 8760 hours. HbA1C: No results for input(s): HGBA1C in the last 72 hours. CBG: No results for input(s): GLUCAP in the last 168 hours. Lipid Profile: No results for input(s): CHOL, HDL, LDLCALC, TRIG, CHOLHDL, LDLDIRECT in the last 72 hours. Thyroid Function Tests: No results for input(s): TSH, T4TOTAL, FREET4, T3FREE, THYROIDAB in the last 72 hours. Anemia Panel: No results for input(s): VITAMINB12, FOLATE, FERRITIN, TIBC, IRON, RETICCTPCT in the last 72 hours. Urine analysis:    Component Value Date/Time   COLORURINE YELLOW 04/13/2017 0202   APPEARANCEUR CLEAR 04/13/2017 0202   LABSPEC 1.011 04/13/2017 0202   PHURINE 5.0 04/13/2017 0202   GLUCOSEU NEGATIVE 04/13/2017 0202   HGBUR MODERATE (A) 04/13/2017 0202   BILIRUBINUR NEGATIVE 04/13/2017 0202   KETONESUR NEGATIVE 04/13/2017 0202   PROTEINUR NEGATIVE 04/13/2017 0202   UROBILINOGEN 0.2 12/26/2012 1100   NITRITE NEGATIVE 04/13/2017 0202   LEUKOCYTESUR TRACE (A) 04/13/2017 0202   Sepsis Labs: No results found for this or any previous visit (from the past 240 hour(s)).   Radiological Exams on Admission: Dg Chest 2 View  Result Date: 04/13/2017 CLINICAL DATA:  Acute onset of hallucinations. EXAM: CHEST  2 VIEW COMPARISON:  Chest  radiograph performed 01/02/2017 FINDINGS: The lungs are mildly hypoexpanded. There is elevation of the left hemidiaphragm, with a large hiatal hernia. Scattered atelectasis is noted at the lung bases. Mild vascular congestion is noted. There is no evidence of pleural effusion or pneumothorax. The heart is normal in size; the mediastinal contour is within normal limits. No acute osseous abnormalities are seen. Clips are noted within the right upper  quadrant, reflecting prior cholecystectomy. IMPRESSION: 1. Lungs mildly hypoexpanded, with scattered atelectasis. Elevation of the left hemidiaphragm. Mild vascular congestion noted. 2. Large hiatal hernia noted. Electronically Signed   By: Garald Balding M.D.   On: 04/13/2017 01:41   Ct Head Wo Contrast  Result Date: 04/13/2017 CLINICAL DATA:  Acute onset of hallucinations. Altered level of consciousness. EXAM: CT HEAD WITHOUT CONTRAST TECHNIQUE: Contiguous axial images were obtained from the base of the skull through the vertex without intravenous contrast. COMPARISON:  CT of the head performed 01/02/2017 FINDINGS: Brain: No evidence of acute infarction, hemorrhage, hydrocephalus, extra-axial collection or mass lesion/mass effect. Prominence of the ventricles and sulci reflects mild to moderate cortical volume loss. Mild cerebellar atrophy is noted. Scattered periventricular and subcortical white matter change likely reflects small vessel ischemic microangiopathy. The brainstem and fourth ventricle are within normal limits. The basal ganglia are unremarkable in appearance. The cerebral hemispheres demonstrate grossly normal gray-white differentiation. No mass effect or midline shift is seen. Vascular: No hyperdense vessel or unexpected calcification. Skull: There is no evidence of fracture; visualized osseous structures are unremarkable in appearance. Sinuses/Orbits: The orbits are within normal limits. The paranasal sinuses and mastoid air cells are well-aerated.  Other: No significant soft tissue abnormalities are seen. IMPRESSION: 1. No acute intracranial pathology seen on CT. 2. Mild to moderate cortical volume loss and scattered small vessel ischemic microangiopathy. Electronically Signed   By: Garald Balding M.D.   On: 04/13/2017 01:39    EKG: Independently reviewed.  Sinus rhythm with significant background artifact  Assessment/Plan Hallacinations, delirium: Acute.  Patient reporting seeing things and intruders in her home that were not there.  Patient appears to be alert and oriented x3 at this time.  Question of possibility of infection and/or dehydration as cause of symptoms. - Admit to MedSurg bed  - Continue to monitor - Social work consult for possible need of placement  Cellulitis of Left leg: Acute.  Patient appears to have recurrence of left lower extremity cellulitis.  Also on the differential includes venous stasis. - Continue Ancef - Wound care consult  Candidiasis of the skin: Acute.  Patient noted to have significant yeast smell with erythema and drainage of the lower abdomen. - Continue Nystatin powder - Follow-up recommendations by wound care  Dehydration: Patient noted to have elevated BUN to creatinine ratio on admission to sit just signs of dehydration. - IV fluids of normal saline at 75 mL/h overnight  Essential hypertension - Continue metoprolol  Hypothyroidism - added on TSH - Continue levothyroxine  H/O breast cancer  Thrombocytopenia: Chronic. - Continue to monitor DVT prophylaxis: lovenox Code Status: Full Family Communication: No family present at bedside Disposition Plan: TBD Consults called: none  Admission status: Observation  Norval Morton MD [facility] Pager [phone]   If 7PM-7AM, please contact night-coverage [email or web address] Password [email or web address]  04/13/2017, 3:07 AM

## 2017-04-13 NOTE — Progress Notes (Signed)
[facility]  H and P reviewed and patient examined.   Principal Problem:   Hallucinations/   Delirium - does not appear to be having any in the hospital - cont to follow - hold Hydrocodone (UDS + fo opiates) - Xanax also on med list but no dose mentioned and UDS is negative for benzodiazepines   Active Problems:   Cellulitis left leg - unwrapped right leg today which had an Unna boot on it - Left leg is more erythematous than the right - no open sores noted- there is tenderness in the legs L > R - cont Ancef and d/c Kenalog to legs for now  Dehydration - cont IVF at 7 5 cc/hr and recheck labs tomorrow    Thrombocytopenia  -chronic    Candidiasis of skin - Cont Nystatin    Hypothyroidism -cont Synthroid  Hypotension with h/o Essential hypertension - d/c Toprol  Anemia - last anemia panel in 7/18 showed AOCD  Debbe Odea, MD

## 2017-04-13 NOTE — ED Notes (Signed)
ED Provider at bedside. 

## 2017-04-13 NOTE — ED Notes (Signed)
Sabra Heck Pt's brother [phone] Marcy Panning (pt's sister) [phone]  Caregiver  Blima Dessert [phone]

## 2017-04-13 NOTE — ED Notes (Signed)
[place] (brother) [phone] Marcy Panning (sister) [phone] Caregiver in [place] [phone] Cell [phone]

## 2017-04-13 NOTE — ED Triage Notes (Signed)
Pt brought in by EMS with [facility] as an Emergency Commitment. Per [employer], they were called to pt's house earlier today because pt believed someone was breaking into her house with some snakes. After they checked scene and determined no one was at residence, pt proceeded to call 911 for various things such as seeing "little people" in her water heater at which time they decided to bring pt in for further evaluation. Pt lives alone.

## 2017-04-13 NOTE — ED Provider Notes (Signed)
[facility] EMERGENCY DEPARTMENT Provider Note   CSN: [id number] Arrival date & time: 04/13/17  0011     History   Chief Complaint Chief Complaint  Patient presents with  . Altered Mental Status    HPI Ashley [name] is a 83 y.o. female.  HPI  83 year old female who presents with hallucinations. Was brought in by [place]. They were called to patient's home earlier today because patient thought that someone was breaking into her home with snakes. They evaluated the scene and found no one around her home. They were able to find a friend who was able to sit with patient during the day. Later on this evening, they were called back to patient's home and she reported seeing "little people" in her water heater and messing up her electronics. They states she seemed confused.   When questioned, she states that the sheriff had brought in the "little people." she states she thought she was doing okay and was going to do some errands tonight. Denies recent illness, fever, chills, vomiting, urinary complaints, chest pain, abdominal pain, recent falls. She does note chronic venous changes in bilateral lower extremities, but states left leg is worse in swelling and redness.  Past Medical History:  Diagnosis Date  . Acid reflux   . Arthritis   . At risk for falls   . B12 deficiency 10/17/2016  . B12 deficiency 10/17/2016  . Back pain   . Bilateral ovarian cysts 02/20/2012  . Cancer ([place])   . Edema   . Hypertension   . Left-sided breast cancer 11/14/2011   Started Arimidex on 07/25/2009. Stage IB, grade 1, ER 99%, PR 100%, Her2 negative, left-sided breast cancer, 8 mm in size, S/P lumpectomy on 04/24/2010, and sentinel node biopsy.  Ki-67 marker 6%.  . Osteoporosis 03/06/2012   Switched from Arimidex to Tamoxifen as a result of osteoporosis   . Other pancytopenia ([place]) 05/29/2014  . Thyroid disease     Patient Active Problem List   Diagnosis Date Noted  . Delirium 01/02/2017  . Cellulitis  01/02/2017  . B12 deficiency 10/17/2016  . Multiple rib fractures 03/03/2016  . Multiple closed fractures of ribs of left side   . Closed nondisplaced fracture of surgical neck of left humerus   . Rib fractures 03/02/2016  . Hypoxia 03/02/2016  . Humerus shaft fracture 03/02/2016  . Fall as cause of accidental injury at home as place of occurrence 03/02/2016  . Iron deficiency anemia 07/08/2014  . Osteoporosis 03/06/2012  . Bilateral ovarian cysts 02/20/2012  . Left-sided breast cancer 11/14/2011  . Hypothyroidism 03/10/2007  . Essential hypertension 03/10/2007  . GERD 03/10/2007    Past Surgical History:  Procedure Laterality Date  . APPENDECTOMY    . BREAST SURGERY    . CHOLECYSTECTOMY    . HERNIA REPAIR    . HIP FRACTURE SURGERY    . TONSILLECTOMY    . TOTAL KNEE REVISION      OB History    No data available       Home Medications    Prior to Admission medications   Medication Sig Start Date End Date Taking? Authorizing Provider  ALPRAZolam Duanne Moron) 0.5 MG tablet  12/30/16   [provider]  aspirin EC 81 MG tablet Take 81 mg by mouth daily with lunch.    [provider]  Cholecalciferol 2000 units TABS Take 1 tablet by mouth daily.    [provider]  ferrous sulfate 325 (65 FE) MG  tablet Take 1 tablet (325 mg total) by mouth daily. 03/06/16   Dorie Rank, MD  HYDROcodone-acetaminophen [facility]) 10-325 MG tablet Take 1 tablet by mouth 4 (four) times daily. 12/26/16   [provider]  levothyroxine (SYNTHROID, LEVOTHROID) 75 MCG tablet Take 75 mcg by mouth daily before breakfast.    [provider]  metoprolol succinate (TOPROL-XL) 25 MG 24 hr tablet Take 1 tablet (25 mg total) by mouth daily. 03/05/16   Thurnell Lose, MD  nitroGLYCERIN (NITROSTAT) 0.4 MG SL tablet Place 0.4 mg under the tongue every 5 (five) minutes as needed for chest pain.     [provider]  nystatin (MYCOSTATIN/NYSTOP) powder Apply topically 3  (three) times daily. 01/03/17   Isaac Bliss, Rayford Halsted, MD  omeprazole (PRILOSEC) 20 MG capsule Take 20 mg by mouth daily.     [provider]  potassium chloride SA (K-DUR,KLOR-CON) 20 MEQ tablet Take 20 mEq by mouth daily.    [provider]  silver sulfADIAZINE (SILVADENE) 1 % cream APPLY TO AFFECTED AREAS THREE TIMES DAILY. 10/11/16   Florian Buff, MD  triamcinolone cream (KENALOG) 0.1 % Apply 1 application topically 2 (two) times daily. Pt applies to legs.    [provider]    Family History No family history on file.  Social History Social History   Tobacco Use  . Smoking status: Never Smoker  . Smokeless tobacco: Never Used  Substance Use Topics  . Alcohol use: No  . Drug use: No     Allergies   Ciprofloxacin; Codeine phosphate; Doxycycline; Erythromycin ethylsuccinate; Levofloxacin; Penicillins; Sulfamethoxazole; Tetracycline hcl; and Lidocaine   Review of Systems Review of Systems  Constitutional: Negative for fever.  Respiratory: Negative for shortness of breath.   Cardiovascular: Negative for chest pain.  Gastrointestinal: Negative for abdominal pain.  Psychiatric/Behavioral: Positive for confusion.  All other systems reviewed and are negative.    Physical Exam Updated Vital Signs BP 115/73   Pulse 65   Temp 98.1 F (36.7 C) (Oral)   Resp 18   Ht _0  (1.321 m)   Wt 43.1 kg (95 lb)   SpO2 96%   BMI 24.70 kg/m   Physical Exam Physical Exam  Nursing note and vitals reviewed. Constitutional: Well developed, well nourished, non-toxic, and in no acute distress Head: Normocephalic and atraumatic.  Mouth/Throat: Oropharynx is clear and moist.  Neck: Normal range of motion. Neck supple.  Cardiovascular: Normal rate and regular rhythm.   Pulmonary/Chest: Effort normal and breath sounds normal.  Abdominal: Soft. There is no tenderness. There is no rebound and no guarding.  Musculoskeletal: Normal range of motion.    Neurological: Alert, oriented x 4, no facial droop, fluent speech, moves all extremities symmetrically, equal hand grip, sensation to light touch intact throughout Skin: Skin is warm and dry.  Psychiatric: Cooperative   ED Treatments / Results  Labs (all labs ordered are listed, but only abnormal results are displayed) Labs Reviewed  URINALYSIS, ROUTINE W REFLEX MICROSCOPIC - Abnormal; Notable for the following components:      Result Value   Hgb urine dipstick MODERATE (*)    Leukocytes, UA TRACE (*)    Bacteria, UA RARE (*)    Squamous Epithelial / LPF 0-5 (*)    All other components within normal limits  CBC WITH DIFFERENTIAL/PLATELET - Abnormal; Notable for the following components:   Platelets 142 (*)    All other components within normal limits  COMPREHENSIVE METABOLIC PANEL - Abnormal; Notable  for the following components:   Chloride 98 (*)    CO2 36 (*)    Glucose, Bld 103 (*)    BUN 22 (*)    Albumin 3.3 (*)    All other components within normal limits  RAPID URINE DRUG SCREEN, HOSP PERFORMED    EKG  EKG Interpretation None       Radiology Dg Chest 2 View  Result Date: 04/13/2017 CLINICAL DATA:  Acute onset of hallucinations. EXAM: CHEST  2 VIEW COMPARISON:  Chest radiograph performed 01/02/2017 FINDINGS: The lungs are mildly hypoexpanded. There is elevation of the left hemidiaphragm, with a large hiatal hernia. Scattered atelectasis is noted at the lung bases. Mild vascular congestion is noted. There is no evidence of pleural effusion or pneumothorax. The heart is normal in size; the mediastinal contour is within normal limits. No acute osseous abnormalities are seen. Clips are noted within the right upper quadrant, reflecting prior cholecystectomy. IMPRESSION: 1. Lungs mildly hypoexpanded, with scattered atelectasis. Elevation of the left hemidiaphragm. Mild vascular congestion noted. 2. Large hiatal hernia noted. Electronically Signed   By: Garald Balding M.D.    On: 04/13/2017 01:41   Ct Head Wo Contrast  Result Date: 04/13/2017 CLINICAL DATA:  Acute onset of hallucinations. Altered level of consciousness. EXAM: CT HEAD WITHOUT CONTRAST TECHNIQUE: Contiguous axial images were obtained from the base of the skull through the vertex without intravenous contrast. COMPARISON:  CT of the head performed 01/02/2017 FINDINGS: Brain: No evidence of acute infarction, hemorrhage, hydrocephalus, extra-axial collection or mass lesion/mass effect. Prominence of the ventricles and sulci reflects mild to moderate cortical volume loss. Mild cerebellar atrophy is noted. Scattered periventricular and subcortical white matter change likely reflects small vessel ischemic microangiopathy. The brainstem and fourth ventricle are within normal limits. The basal ganglia are unremarkable in appearance. The cerebral hemispheres demonstrate grossly normal gray-white differentiation. No mass effect or midline shift is seen. Vascular: No hyperdense vessel or unexpected calcification. Skull: There is no evidence of fracture; visualized osseous structures are unremarkable in appearance. Sinuses/Orbits: The orbits are within normal limits. The paranasal sinuses and mastoid air cells are well-aerated. Other: No significant soft tissue abnormalities are seen. IMPRESSION: 1. No acute intracranial pathology seen on CT. 2. Mild to moderate cortical volume loss and scattered small vessel ischemic microangiopathy. Electronically Signed   By: Garald Balding M.D.   On: 04/13/2017 01:39    Procedures Procedures (including critical care time)  Medications Ordered in ED Medications  ceFAZolin (ANCEF) IVPB 1 g/50 mL premix (1 g Intravenous New Bag/Given 04/13/17 0307)     Initial Impression / Assessment and Plan / ED Course  I have reviewed the triage vital signs and the nursing notes.  Pertinent labs & imaging results that were available during my care of the patient were reviewed by me and  considered in my medical decision making (see chart for details).     Well appearing, in no acute distress. AO x 4 without focal neurological deficits. Seems lucid currently. Vital signs stable and non-concerning.   Records reviewed with 12/2016 admission for left leg cellulitis and hallucinations. Similar picture. norco and xanax discontinued on that admission as felt to be related to metnal status.  He continues to receive Norco prescriptions, but Xanax prescriptions were discontinued per her PCP.  She does have erythematous, swollen left lower extremity that could be cellulitis versus venous stasis dermatitis. Her mental status may be related to possible cellulitis or could be changes consistent with dementia.  Her  blood work overall is reassuring.  No major electrolyte or metabolic derangements.  Chest x-ray visualized and shows no evidence of acute cardiopulmonary processes such as pneumonia.  CT head visualized and shows no acute intracranial processes.  UA is not convincing for infection.  Given concern for safety at home tonight, I have given her a dose of Ancef, and admitted her under Dr. Tamala Julian for possible encephalopathy from cellulitis  Final Clinical Impressions(s) / ED Diagnoses   Final diagnoses:  Left leg cellulitis    ED Discharge Orders    None       Forde Dandy, MD 04/13/17 [phone]

## 2017-04-14 DIAGNOSIS — B372 Candidiasis of skin and nail: Secondary | ICD-10-CM | POA: Diagnosis not present

## 2017-04-14 DIAGNOSIS — L89151 Pressure ulcer of sacral region, stage 1: Secondary | ICD-10-CM | POA: Diagnosis not present

## 2017-04-14 DIAGNOSIS — R443 Hallucinations, unspecified: Secondary | ICD-10-CM

## 2017-04-14 DIAGNOSIS — E039 Hypothyroidism, unspecified: Secondary | ICD-10-CM

## 2017-04-14 DIAGNOSIS — D696 Thrombocytopenia, unspecified: Secondary | ICD-10-CM | POA: Diagnosis not present

## 2017-04-14 DIAGNOSIS — R410 Disorientation, unspecified: Secondary | ICD-10-CM

## 2017-04-14 DIAGNOSIS — I10 Essential (primary) hypertension: Secondary | ICD-10-CM

## 2017-04-14 DIAGNOSIS — L8990 Pressure ulcer of unspecified site, unspecified stage: Secondary | ICD-10-CM

## 2017-04-14 DIAGNOSIS — L03116 Cellulitis of left lower limb: Principal | ICD-10-CM

## 2017-04-14 DIAGNOSIS — F419 Anxiety disorder, unspecified: Secondary | ICD-10-CM | POA: Diagnosis not present

## 2017-04-14 LAB — CBC
HEMATOCRIT: 39.1 % (ref 36.0–46.0)
Hemoglobin: 11.5 g/dL — ABNORMAL LOW (ref 12.0–15.0)
MCH: 28.4 pg (ref 26.0–34.0)
MCHC: 29.4 g/dL — ABNORMAL LOW (ref 30.0–36.0)
MCV: 96.5 fL (ref 78.0–100.0)
Platelets: 136 10*3/uL — ABNORMAL LOW (ref 150–400)
RBC: 4.05 MIL/uL (ref 3.87–5.11)
RDW: 15 % (ref 11.5–15.5)
WBC: 7 10*3/uL (ref 4.0–10.5)

## 2017-04-14 LAB — BASIC METABOLIC PANEL
ANION GAP: 3 — AB (ref 5–15)
BUN: 14 mg/dL (ref 6–20)
CALCIUM: 8.7 mg/dL — AB (ref 8.9–10.3)
CO2: 35 mmol/L — AB (ref 22–32)
Chloride: 102 mmol/L (ref 101–111)
Creatinine, Ser: 0.73 mg/dL (ref 0.44–1.00)
Glucose, Bld: 88 mg/dL (ref 65–99)
Potassium: 3.8 mmol/L (ref 3.5–5.1)
Sodium: 140 mmol/L (ref 135–145)

## 2017-04-14 NOTE — Care Management (Signed)
CM has seen pt for assessment. Pt is from home, lives alone. She reports being ind with ALD's. She falls frequently. She has neighbor who checks on her often. She had friend who helped care for her but that friend is injured at the moment though no longer in SNF. She is active with [facility] for [facility] services. She uses cane bilaterally for ambulation. She has nighttime oxygen through [facility], says it needs to be serviced. She is wearing oxygen continuously at this time, however it is documented that at 0530 this morning she was on room air and saturating at 92%. No mention of oxygen being applied or pt desaturing below 90%. She is alert and oriented, she discusses appealing charges that the hospital has made to her insurance from previous hospital stays. She is upset the hospital billed for physical therapy when she did not "get" physical therapy, she walked up the hall WITH the therapist but the therapist and her agreed she did not need PT. She does ramble and seems to be easily distracted from the subject and has to be redirected often. She says she wants to go home and will not consider any other option. Pt has open APS case per CSW note. Pt did not mention this in our conversation.

## 2017-04-14 NOTE — Progress Notes (Signed)
[facility] PROGRESS NOTE  Ashley [name] YHC:[id number] DOB: 18-Jun-1931 DOA: 04/13/2017 PCP: Redmond School, MD  Interim summary and history of present illness 83 y.o. female with medical history significant of chronic anemia, hypothyroidism, vitamin B12 deficiency, thrombocytopenia, GERD, and H/O breast cancer; who presents after calling the [facility] after being found to be altered.  At baseline patient lives alone and family reportedly live out of state.  Patient had called earlier yesterday stating someone was trying to break into her house with snakes.  Once they arrived they evaluated the premises, but found no such intruder. A nearby neighbor was able to sit with her for most of the day.  However patient called again for help  with complaints of seeing little people in her water heater.  Patient notes complaints of left lower extremity swelling and redness.  Home health has been coming out to wrap her legs but they normally do not come on the weekends. Lastly, she has had lower abdominal skin rash with drainage  Assessment/Plan: 1-left leg cellulitis -Recurrent most likely associated with breakdown and infection of her skin in the setting of chronic lower extremity edema and venous insufficiency. -Will continue Ancef -Continue wound care services recommendations and follow [facility] boot -Continue supportive care.  2-hallucinations/delirium -Present prior to admission. -Has completely resolved -Will avoid narcotics and benzodiazepines as much as possible as most likely her changes in mental status came from medications interaction.  3-dehydration -Improved with IV fluids -Will start decreasing/adjusting the IV fluids rate -Patient advised to keep herself well-hydrated.  4-chronic thrombocytopenia -Stable -No signs of acute bleeding appreciated.  5-skin candidiasis (affecting abdominal bundles and inguinal folds) -Continue the use of  nystatin  6-hypothyroidism  -continue Synthroid  7-anemia of chronic disease -No signs of bleeding -No need for transfusion -Will follow hemoglobin trend  8-chronic respiratory failure -Continue oxygen supplementation -Breathing stable and at baseline according to patient.  9-pressure injury: Barely stage I decubitus ulcer appreciated. -Present prior to admission -No open wounds, no drainage. -Continue preventive measurements.  Code Status: Full code Family Communication: No family at bedside Disposition Plan: To be determined.  For now we will continue IV antibiotics, follow wound care service recommendations continue supportive care.   Consultants:  Wound care service  Procedures:  See below for x-ray reports.  Antibiotics:  Ancef 11/17  HPI/Subjective: No fever, no nausea, no vomiting, denies shortness of breath and chest pain.  Objective: Vitals:   04/14/17 0531 04/14/17 1338  BP: (!) 110/55 97/70  Pulse: (!) 57 64  Resp: 16 18  Temp: (!) 97.4 F (36.3 C) 97.6 F (36.4 C)  SpO2: 92% 99%    Intake/Output Summary (Last 24 hours) at 04/14/2017 1801 Last data filed at 04/14/2017 1200 Gross per 24 hour  Intake 1651.25 ml  Output -  Net 1651.25 ml   Filed Weights   04/13/17 0015 04/13/17 0436  Weight: 43.1 kg (95 lb) 44.2 kg (97 lb 7.1 oz)    Exam:   General: Afebrile, no chest pain, reports breathing at baseline, no nausea, no vomiting.  Alert, awake and oriented x3.  Cardiovascular: Positive systolic ejection murmur, no rubs, no gallops, S1 and S2 appreciated on exam.  Respiratory: No use of accessory muscles, no wheezing, no crackles.  Fair air movement bilaterally.  No positive scattered rhonchi  Abdomen: Soft, nontender, nondistended, positive bowel sounds.  Patient with erythema and his catheter denuded skin in her abdominal pannus.  Musculoskeletal: Bilateral lower extremity with erythema and minimal  generalized edema extending all the  way to her knees.  There is some discomfort on palpation especially on her left lower extremity.  Data Reviewed: Basic Metabolic Panel: Recent Labs  Lab 04/13/17 0059 04/14/17 0526  NA 139 140  K 4.2 3.8  CL 98* 102  CO2 36* 35*  GLUCOSE 103* 88  BUN 22* 14  CREATININE 0.82 0.73  CALCIUM 9.7 8.7*   Liver Function Tests: Recent Labs  Lab 04/13/17 0059  AST 20  ALT 16  ALKPHOS 61  BILITOT 1.1  PROT 6.5  ALBUMIN 3.3*   CBC: Recent Labs  Lab 04/13/17 0059 04/14/17 0526  WBC 6.3 7.0  NEUTROABS 4.9  --   HGB 12.6 11.5*  HCT 41.2 39.1  MCV 94.1 96.5  PLT 142* 136*   Studies: Dg Chest 2 View  Result Date: 04/13/2017 CLINICAL DATA:  Acute onset of hallucinations. EXAM: CHEST  2 VIEW COMPARISON:  Chest radiograph performed 01/02/2017 FINDINGS: The lungs are mildly hypoexpanded. There is elevation of the left hemidiaphragm, with a large hiatal hernia. Scattered atelectasis is noted at the lung bases. Mild vascular congestion is noted. There is no evidence of pleural effusion or pneumothorax. The heart is normal in size; the mediastinal contour is within normal limits. No acute osseous abnormalities are seen. Clips are noted within the right upper quadrant, reflecting prior cholecystectomy. IMPRESSION: 1. Lungs mildly hypoexpanded, with scattered atelectasis. Elevation of the left hemidiaphragm. Mild vascular congestion noted. 2. Large hiatal hernia noted. Electronically Signed   By: Garald Balding M.D.   On: 04/13/2017 01:41   Ct Head Wo Contrast  Result Date: 04/13/2017 CLINICAL DATA:  Acute onset of hallucinations. Altered level of consciousness. EXAM: CT HEAD WITHOUT CONTRAST TECHNIQUE: Contiguous axial images were obtained from the base of the skull through the vertex without intravenous contrast. COMPARISON:  CT of the head performed 01/02/2017 FINDINGS: Brain: No evidence of acute infarction, hemorrhage, hydrocephalus, extra-axial collection or mass lesion/mass effect.  Prominence of the ventricles and sulci reflects mild to moderate cortical volume loss. Mild cerebellar atrophy is noted. Scattered periventricular and subcortical white matter change likely reflects small vessel ischemic microangiopathy. The brainstem and fourth ventricle are within normal limits. The basal ganglia are unremarkable in appearance. The cerebral hemispheres demonstrate grossly normal gray-white differentiation. No mass effect or midline shift is seen. Vascular: No hyperdense vessel or unexpected calcification. Skull: There is no evidence of fracture; visualized osseous structures are unremarkable in appearance. Sinuses/Orbits: The orbits are within normal limits. The paranasal sinuses and mastoid air cells are well-aerated. Other: No significant soft tissue abnormalities are seen. IMPRESSION: 1. No acute intracranial pathology seen on CT. 2. Mild to moderate cortical volume loss and scattered small vessel ischemic microangiopathy. Electronically Signed   By: Garald Balding M.D.   On: 04/13/2017 01:39    Scheduled Meds: . aspirin EC  81 mg Oral Q lunch  . enoxaparin (LOVENOX) injection  30 mg Subcutaneous Q24H  . ferrous sulfate  325 mg Oral Daily  . levothyroxine  75 mcg Oral QAC breakfast  . nystatin   Topical TID  . pantoprazole  40 mg Oral Daily   Continuous Infusions: . sodium chloride 75 mL/hr at 04/14/17 0612  .  ceFAZolin (ANCEF) IV Stopped (04/14/17 1441)    Time spent: 25 minutes    [facility] Pager [phone]. If 7PM-7AM, please contact night-coverage at [email or web address], password [facility] 04/14/2017, 6:01 PM  LOS: 0 days

## 2017-04-14 NOTE — Care Management Obs Status (Signed)
[facility] NOTIFICATION   Patient Details  Name: Ashley [name] MRN: [id number] Date of Birth: 1932-05-04   Medicare Observation Status Notification Given:  Yes    Sherald Barge, RN 04/14/2017, 11:15 AM

## 2017-04-14 NOTE — Clinical Social Work Note (Signed)
SW notified by APS today that they have an open case on pt and will be coming to see her today. Full SW assessment to follow later today or tomorrow.

## 2017-04-14 NOTE — Progress Notes (Signed)
PT Cancellation Note  Patient Details Name: [name] MRN: [id number] DOB: May 16, 1932   Cancelled Treatment:    Reason Eval/Treat Not Completed: Other (comment). Pt initially agreeable to PT evaluation, however, after obtaining history, pt's neighbor entered the room and pt asked to end therapy so that she could visit with him. PT gently encouraged pt to go for a brief walk so that her mobility could be assessed and discharge recommendations could be made, but pt became agitated and stated that she did not want to participate, she wanted to converse with her neighbor, and stated "don't agitate me now." PT ended session and explained to the pt that physical therapy would be back tomorrow in order to make appropriate discharge recommendations.     Geraldine Solar PT, DPT

## 2017-04-14 NOTE — Consult Note (Signed)
[facility] Nurse wound consult note Reason for Consult: Chronic venous insufficiency to bilateral lower legs.  Now has cellulitis.  Tender to touch.   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 Tender and itchy.  Wound type: venous to legs MASD (fungal overgrowth) to skin folds. Intertriginous dermatitis Pressure Injury POA: NA Measurement: Bilateral lower legs with erythema and minimal generalzied edema noted.  She states her toes feel tight.  They are erythematous and tender and I voiced that I felt it was more infection than edema at this time.  I would like to keep wraps off today then proceed with a dry wrap tomorrow.   Erythema and scattered denuded skin noted to abdominal pannus (small fold of excess skin) and inguinal folds.  Wound KDX:IPJA  Erythema pink and moist to inguinal folds Drainage (amount, consistency, odor) none Periwound:erythema to legs Dressing procedure/placement/frequency: Cleanse bilateral legs with soap and water.  Begin dry, [facility] boot 04/15/17.  Wrap legs from below toes to below knee with kerlix.  Secure with self adherent Coban. Change three times weekly.    Cleanse abdominal and inguinal fold thoroughly with soap and water.  Apply Interdry Ag to skin folds.  Send patient home with an extra pack.  Measure and cut length of InterDry Ag+ to fit in skin folds that have skin breakdown Tuck InterDry  Ag+ fabric into skin folds in a single layer, allow for 2 inches of overhang from skin edges to allow for wicking to occur May remove to bathe; dry area thoroughly and then tuck into affected areas again Do not apply any creams or ointments when using InterDry Ag+ DO NOT THROW AWAY FOR 5 DAYS unless soiled with stool DO NOT [facility] product, this will inactivate the silver in the material  New sheet of Interdry Ag+ should be applied after 5 days of use if patient continues to have skin breakdown   Will not follow at this time.  Please re-consult if needed.  Domenic Moras RN BSN [place] Pager [phone]

## 2017-04-15 DIAGNOSIS — F419 Anxiety disorder, unspecified: Secondary | ICD-10-CM

## 2017-04-15 DIAGNOSIS — L03116 Cellulitis of left lower limb: Secondary | ICD-10-CM | POA: Diagnosis not present

## 2017-04-15 DIAGNOSIS — I10 Essential (primary) hypertension: Secondary | ICD-10-CM | POA: Diagnosis not present

## 2017-04-15 DIAGNOSIS — R410 Disorientation, unspecified: Secondary | ICD-10-CM | POA: Diagnosis not present

## 2017-04-15 DIAGNOSIS — R443 Hallucinations, unspecified: Secondary | ICD-10-CM | POA: Diagnosis not present

## 2017-04-15 DIAGNOSIS — B372 Candidiasis of skin and nail: Secondary | ICD-10-CM | POA: Diagnosis not present

## 2017-04-15 LAB — BASIC METABOLIC PANEL
BUN: 12 mg/dL (ref 6–20)
CHLORIDE: 102 mmol/L (ref 101–111)
CO2: 36 mmol/L — AB (ref 22–32)
Calcium: 8.5 mg/dL — ABNORMAL LOW (ref 8.9–10.3)
Creatinine, Ser: 0.6 mg/dL (ref 0.44–1.00)
GFR calc non Af Amer: >60 mL/min (ref >60)
Glucose, Bld: 94 mg/dL (ref 65–99)
POTASSIUM: 3.6 mmol/L (ref 3.5–5.1)
SODIUM: 140 mmol/L (ref 135–145)

## 2017-04-15 MED ORDER — ALPRAZOLAM 0.5 MG PO TABS: 0.2500 mg | ORAL_TABLET | Freq: Two times a day (BID) | ORAL | Status: AC | PRN

## 2017-04-15 MED ORDER — CEPHALEXIN 250 MG PO CAPS: 250.0000 mg | ORAL_CAPSULE | Freq: Three times a day (TID) | ORAL | 0 refills | Status: AC

## 2017-04-15 MED ORDER — ECONAZOLE NITRATE 1 % EX CREA: TOPICAL_CREAM | Freq: Two times a day (BID) | CUTANEOUS | 0 refills | Status: AC

## 2017-04-15 MED ORDER — ACETAMINOPHEN 325 MG PO TABS: 650.0000 mg | ORAL_TABLET | Freq: Four times a day (QID) | ORAL | 0 refills | Status: AC | PRN

## 2017-04-15 NOTE — Discharge Summary (Signed)
Physician Discharge Summary  Ashley [name] MGQ:[id number] DOB: 1931-11-06 DOA: 04/13/2017  PCP: Redmond School, MD  Admit date: 04/13/2017 Discharge date: 04/15/2017  Time spent: 35 minutes  Recommendations for Outpatient Follow-up:  Reassess blood pressure and adjust antihypertensive regimen as needed Repeat CBC to follow hemoglobin and platelets trend. Repeat basic metabolic panel to follow electrolytes and renal function.  Discharge Diagnoses:  Principal Problem:   Hallucinations Active Problems:   Hypothyroidism   Essential hypertension   Delirium   Cellulitis   Thrombocytopenia (HCC)   Candidiasis of skin   Pressure injury of skin   Left leg cellulitis   Anxiety   Discharge Condition: Stable and improved.  Patient discharged home with home health services and instructions to follow-up with PCP in 10 days.  Diet recommendation: Heart healthy diet  Filed Weights   04/13/17 0015 04/13/17 0436  Weight: 43.1 kg (95 lb) 44.2 kg (97 lb 7.1 oz)    History of present illness:  83 y.o.femalewith medical history significant ofchronic anemia, hypothyroidism, vitamin B12 deficiency, thrombocytopenia, GERD,and H/Obreast cancer;who presents after calling the [facility] department after being found to be altered. At baseline patient lives alone and family reportedly live out of state. Patient had called earlier yesterday stating someone was trying to break into her house with snakes. Once they arrived they evaluated thepremises, but found no such intruder.Anearby neighbor was able to sit with her for most of the day. However patient called again for help withcomplaints of seeing little people in her water heater. Patient notes complaints of left lower extremity swelling and redness. Home health has been coming out to wrap her legs but they normally do not come on the weekends. Lastly, she has had lower abdominal skin rash with drainage   Hospital Course:   1-left leg cellulitis -Recurrent most likely associated with breakdown and infection of her skin in the setting of chronic lower extremity edema and venous insufficiency. -Will discharge on keflex to complete antibiotic therapy. -Continue wound care services recommendations and follow Unna boot -Continue supportive care and legs elevation as much as possible..  2-hallucinations/delirium -Present prior to admission. -Has completely resolved prior to discharge -Changes in mental status came from medications interaction most liekly -Use as needed Tylenol and her benzodiazepine has been cut in half of her regular dose to be used just as needed for anxiety.  3-dehydration -Improved/resolved with IV fluids -Patient advised to keep herself well-hydrated.  4-chronic thrombocytopenia -Stable -No signs of acute bleeding appreciated. -follow platelets count with repeat CBC at follow up visit   5-skin candidiasis (affecting abdominal bundles and inguinal folds) -Continue the use of fungal ointment BID -while inpatient interdry was applied; but not cover by insurance -patient advised to keep area clean and dry  6-hypothyroidism  -continue Synthroid  7-anemia of chronic disease -No signs of bleeding -No need for transfusion during this hospitalization -Will recommend CBC at her hospital follow-up visit to re-assess  hemoglobin trend.  8-chronic respiratory failure -Continue oxygen supplementation -Breathing stable and at baseline according to patient.  9-pressure injury: Barely a stage I decubitus ulcer appreciated. -Present prior to admission -No open wounds, no drainage or signs of super imposed infection in this area. -Continue preventive measurements.  10-hypertension -Pressure soft to borderline low -We will discontinue metoprolol and to follow-up with PCP to avoid further hypotension episodes and decrease the chances of syncope.   Procedures:  See below for x-ray  reports.  Consultations:  Wound care service  Discharge Exam: Vitals:  04/15/17 0624 04/15/17 1300  BP: 98/62 (!) 101/56  Pulse: 66 63  Resp: 20 20  Temp: 97.9 F (36.6 C) 98 F (36.7 C)  SpO2: 100% 100%    General: Afebrile, no chest pain, reports breathing at baseline, no nausea, no vomiting.  Alert, awake and oriented x3.  Cardiovascular: Positive systolic ejection murmur, no rubs, no gallops, S1 and S2 appreciated on exam.  Respiratory: No use of accessory muscles, no wheezing, no crackles.  Fair air movement bilaterally.  No positive scattered rhonchi  Abdomen: Soft, nontender, nondistended, positive bowel sounds.  Patient with erythema and his catheter denuded skin in her abdominal pannus.  Musculoskeletal: Bilateral lower extremity with mild erythema and minimal generalized edema extending all the way to her knees.  There is some discomfort on palpation especially on her left lower extremity (which is overall improved)..   Discharge Instructions   Discharge Instructions    Discharge instructions   Complete by:  As directed    Keep yourself well-hydrated Take medication as prescribed Arrange follow-up with PCP in 10 days   Increase activity slowly   Complete by:  As directed      Current Discharge Medication List    START taking these medications   Details  acetaminophen (TYLENOL) 325 MG tablet Take 2 tablets (650 mg total) by mouth every 6 (six) hours as needed for mild pain or headache (or Fever >/= 101). Qty: 40 tablet, Refills: 0    cephALEXin (KEFLEX) 250 MG capsule Take 1 capsule (250 mg total) by mouth 3 (three) times daily for 10 days. Qty: 40 capsule, Refills: 0    econazole nitrate 1 % cream Apply topically 2 (two) times daily. groin and abdominal folds. Qty: 30 g, Refills: 0      CONTINUE these medications which have CHANGED   Details  ALPRAZolam (XANAX) 0.5 MG tablet Take 0.5 tablets (0.25 mg total) by mouth 2 (two) times daily as needed  for anxiety.      CONTINUE these medications which have NOT CHANGED   Details  aspirin EC 81 MG tablet Take 81 mg by mouth daily with lunch.    Cholecalciferol 2000 units TABS Take 1 tablet by mouth daily.    levothyroxine (SYNTHROID, LEVOTHROID) 75 MCG tablet Take 75 mcg by mouth daily before breakfast.    nitroGLYCERIN (NITROSTAT) 0.4 MG SL tablet Place 0.4 mg under the tongue every 5 (five) minutes as needed for chest pain.     nystatin (MYCOSTATIN/NYSTOP) powder Apply topically 3 (three) times daily. Qty: 15 g, Refills: 0    omeprazole (PRILOSEC) 20 MG capsule Take 20 mg by mouth daily.     potassium chloride SA (K-DUR,KLOR-CON) 20 MEQ tablet Take 20 mEq by mouth daily.    silver sulfADIAZINE (SILVADENE) 1 % cream APPLY TO AFFECTED AREAS THREE TIMES DAILY. Qty: 50 g, Refills: 11    ferrous sulfate 325 (65 FE) MG tablet Take 1 tablet (325 mg total) by mouth daily. Qty: 30 tablet, Refills: 0      STOP taking these medications     HYDROcodone-acetaminophen (NORCO) 10-325 MG tablet      metoprolol succinate (TOPROL-XL) 25 MG 24 hr tablet      triamcinolone cream (KENALOG) 0.1 %        Allergies  Allergen Reactions  . Ciprofloxacin Hives and Itching  . Codeine Phosphate Other (See Comments)    Reaction:  Unknown   . Doxycycline Hives and Itching  . Erythromycin Ethylsuccinate Other (See Comments)  Reaction:  Unknown   . Levofloxacin Other (See Comments)    Reaction:  Unknown   . Penicillins Other (See Comments)    Reaction:  Unknown  Has patient had a PCN reaction causing immediate rash, facial/tongue/throat swelling, SOB or lightheadedness with hypotension: Unsure Has patient had a PCN reaction causing severe rash involving mucus membranes or skin necrosis: Unsure Has patient had a PCN reaction that required hospitalization Unsure Has patient had a PCN reaction occurring within the last 10 years: No If all of the above answers are "NO", then may proceed with  Cephalosporin use.  . Sulfamethoxazole Other (See Comments)    Reaction:  Unknown   . Tetracycline Hcl Other (See Comments)    Reaction:  Unknown   . Lidocaine Other (See Comments)    Reaction:  Unknown     The results of significant diagnostics from this hospitalization (including imaging, microbiology, ancillary and laboratory) are listed below for reference.    Significant Diagnostic Studies: Dg Chest 2 View  Result Date: 04/13/2017 CLINICAL DATA:  Acute onset of hallucinations. EXAM: CHEST  2 VIEW COMPARISON:  Chest radiograph performed 01/02/2017 FINDINGS: The lungs are mildly hypoexpanded. There is elevation of the left hemidiaphragm, with a large hiatal hernia. Scattered atelectasis is noted at the lung bases. Mild vascular congestion is noted. There is no evidence of pleural effusion or pneumothorax. The heart is normal in size; the mediastinal contour is within normal limits. No acute osseous abnormalities are seen. Clips are noted within the right upper quadrant, reflecting prior cholecystectomy. IMPRESSION: 1. Lungs mildly hypoexpanded, with scattered atelectasis. Elevation of the left hemidiaphragm. Mild vascular congestion noted. 2. Large hiatal hernia noted. Electronically Signed   By: Garald Balding M.D.   On: 04/13/2017 01:41   Ct Head Wo Contrast  Result Date: 04/13/2017 CLINICAL DATA:  Acute onset of hallucinations. Altered level of consciousness. EXAM: CT HEAD WITHOUT CONTRAST TECHNIQUE: Contiguous axial images were obtained from the base of the skull through the vertex without intravenous contrast. COMPARISON:  CT of the head performed 01/02/2017 FINDINGS: Brain: No evidence of acute infarction, hemorrhage, hydrocephalus, extra-axial collection or mass lesion/mass effect. Prominence of the ventricles and sulci reflects mild to moderate cortical volume loss. Mild cerebellar atrophy is noted. Scattered periventricular and subcortical white matter change likely reflects small  vessel ischemic microangiopathy. The brainstem and fourth ventricle are within normal limits. The basal ganglia are unremarkable in appearance. The cerebral hemispheres demonstrate grossly normal gray-white differentiation. No mass effect or midline shift is seen. Vascular: No hyperdense vessel or unexpected calcification. Skull: There is no evidence of fracture; visualized osseous structures are unremarkable in appearance. Sinuses/Orbits: The orbits are within normal limits. The paranasal sinuses and mastoid air cells are well-aerated. Other: No significant soft tissue abnormalities are seen. IMPRESSION: 1. No acute intracranial pathology seen on CT. 2. Mild to moderate cortical volume loss and scattered small vessel ischemic microangiopathy. Electronically Signed   By: Garald Balding M.D.   On: 04/13/2017 01:39   Labs: Basic Metabolic Panel: Recent Labs  Lab 04/13/17 0059 04/14/17 0526 04/15/17 0406  NA 139 140 140  K 4.2 3.8 3.6  CL 98* 102 102  CO2 36* 35* 36*  GLUCOSE 103* 88 94  BUN 22* 14 12  CREATININE 0.82 0.73 0.60  CALCIUM 9.7 8.7* 8.5*   Liver Function Tests: Recent Labs  Lab 04/13/17 0059  AST 20  ALT 16  ALKPHOS 61  BILITOT 1.1  PROT 6.5  ALBUMIN 3.3*  CBC: Recent Labs  Lab 04/13/17 0059 04/14/17 0526  WBC 6.3 7.0  NEUTROABS 4.9  --   HGB 12.6 11.5*  HCT 41.2 39.1  MCV 94.1 96.5  PLT 142* 136*    Signed:  Barton Dubois MD.  [facility] 04/15/2017, 2:47 PM

## 2017-04-15 NOTE — Evaluation (Signed)
Physical Therapy Evaluation Patient Details Name: Ashley [name] MRN: [id number] DOB: 09/15/1931 Today's Date: 04/15/2017   History of Present Illness  Ashley [name] is a 83 y.o. female with medical history significant of chronic anemia, hypothyroidism, vitamin B12 deficiency, thrombocytopenia, GERD, and H/O breast cancer; who presents after calling the [facility] after being found to be altered.  At baseline patient lives alone and family reportedly live out of state.  Patient had called earlier yesterday stating someone was trying to break into her house with snakes.  Once they arrived they evaluated the premises, but found no such intruder. A nearby neighbor was able to sit with her for most of the day.  However patient called again for help  with complaints of seeing little people in her water heater.  Patient notes complaints of left lower extremity swelling and redness.  Home health has been coming out to wrap her legs but they normally do not come on the weekends.  Lastly, she has had lower abdominal skin rash with drainage.  She reports that she has been putting some creams on the rash without relief of symptoms.  Patient had similar admission and 12/2016 for which she was noted have hallucinations but was suspected to be related to cellulitis for which she was treated with vancomycin initially and switched over to Keflex.  Patient was also advised to discontinue hydrocodone and Xanax at that time.  Patient notes that she still takes hydrocodone but is no longer prescribe Xanax    Clinical Impression  Patient very unsteady on feet and at high risk for falls once fatigued.  Patient will benefit from continued physical therapy in hospital and recommended venue below to increase strength, balance, endurance for safe ADLs and gait.    Follow Up Recommendations SNF;Supervision/Assistance - 24 hour    Equipment Recommendations  None recommended by PT    Recommendations  for Other Services       Precautions / Restrictions Precautions Precautions: Fall Precaution Comments: 1-2 falls in the last 6 months; 1 occurred when she went to go answer the telephone Restrictions Weight Bearing Restrictions: No      Mobility  Bed Mobility Overal bed mobility: Needs Assistance Bed Mobility: Supine to Sit;Sit to Supine     Supine to sit: Supervision Sit to supine: Supervision      Transfers Overall transfer level: Needs assistance Equipment used: Quad cane(quad canes x 2) Transfers: Sit to/from [facility] Sit to Stand: Min assist Stand pivot transfers: Min assist       General transfer comment: labored movement with much time  Ambulation/Gait Ambulation/Gait assistance: Min assist Ambulation Distance (Feet): 15 Feet Assistive device: Quad cane(quad canes x 2) Gait Pattern/deviations: Decreased step length - right;Decreased step length - left;Decreased stride length   Gait velocity interpretation: Below normal speed for age/gender General Gait Details: slightly unsteady with slow labored cadence, 4 point gait pattern without loss of balance, limited secondary to c/o fatigue  Stairs            Wheelchair Mobility    Modified Rankin (Stroke Patients Only)       Balance Overall balance assessment: Needs assistance Sitting-balance support: No upper extremity supported;Feet supported Sitting balance-Leahy Scale: Good     Standing balance support: Bilateral upper extremity supported;During functional activity Standing balance-Leahy Scale: Fair  Pertinent Vitals/Pain Pain Assessment: No/denies pain    Home Living                   Additional Comments: pt uses bil quad canes for ambulation    Prior Function Level of Independence: Independent with assistive device(s)         Comments: independent with IADLs and ADLs but not driving     Hand Dominance    Dominant Hand: Right    Extremity/Trunk Assessment   Upper Extremity Assessment Upper Extremity Assessment: Generalized weakness    Lower Extremity Assessment Lower Extremity Assessment: Generalized weakness    Cervical / Trunk Assessment Cervical / Trunk Assessment: Kyphotic  Communication   Communication: No difficulties  Cognition Arousal/Alertness: Awake/alert Behavior During Therapy: WFL for tasks assessed/performed Overall Cognitive Status: Within Functional Limits for tasks assessed                                        General Comments      Exercises     Assessment/Plan    PT Assessment Patient needs continued PT services  PT Problem List Decreased strength;Decreased range of motion;Decreased activity tolerance;Decreased balance;Decreased mobility       PT Treatment Interventions Gait training;Functional mobility training;Therapeutic activities;Therapeutic exercise;Patient/family education    PT Goals (Current goals can be found in the Care Plan section)  Acute Rehab PT Goals Patient Stated Goal: return home friends to assist PT Goal Formulation: With patient Time For Goal Achievement: 04/29/17 Potential to Achieve Goals: Good    Frequency Min 3X/week   Barriers to discharge        Co-evaluation               AM-PAC PT "6 Clicks" Daily Activity  Outcome Measure Difficulty turning over in bed (including adjusting bedclothes, sheets and blankets)?: None Difficulty moving from lying on back to sitting on the side of the bed? : None Difficulty sitting down on and standing up from a chair with arms (e.g., wheelchair, bedside commode, etc,.)?: A Little Help needed moving to and from a bed to chair (including a wheelchair)?: A Little Help needed walking in hospital room?: A Lot Help needed climbing 3-5 steps with a railing? : Total 6 Click Score: 17    End of Session   Activity Tolerance: Patient tolerated treatment well;Patient  limited by fatigue Patient left: in bed;with call bell/phone within reach Nurse Communication: Mobility status PT Visit Diagnosis: Unsteadiness on feet (R26.81);Other abnormalities of gait and mobility (R26.89);Muscle weakness (generalized) (M62.81)    Time: 4709-6283 PT Time Calculation (min) (ACUTE ONLY): 36 min   Charges:   PT Evaluation $PT Eval Moderate Complexity: 1 Mod PT Treatments $Therapeutic Activity: 8-22 mins   PT G Codes:   PT G-Codes **NOT FOR INPATIENT CLASS** Functional Assessment Tool Used: AM-PAC 6 Clicks Basic Mobility Functional Limitation: Mobility: Walking and moving around Mobility: Walking and Moving Around Current Status (M6294): At least 40 percent but less than 60 percent impaired, limited or restricted Mobility: Walking and Moving Around Goal Status [phone]): At least 40 percent but less than 60 percent impaired, limited or restricted Mobility: Walking and Moving Around Discharge Status [phone]): At least 40 percent but less than 60 percent impaired, limited or restricted    2:25 PM, 04/15/17 Lonell Grandchild, MPT Physical Therapist with [facility] [phone] office [phone] mobile phone

## 2017-04-15 NOTE — Clinical Social Work Note (Signed)
Clinical Social Work Assessment  Patient Details  Name: Ashley [name] MRN: [id number] Date of Birth: 07-Oct-1931  Date of referral:  04/15/17               Reason for consult:  Discharge Planning                Permission sought to share information with:    Permission granted to share information::     Name::        Agency::     Relationship::     Contact Information:     Housing/Transportation Living arrangements for the past 2 months:  Single Family Home Source of Information:  Patient Patient Interpreter Needed:  None Criminal Activity/Legal Involvement Pertinent to Current Situation/Hospitalization:    Significant Relationships:  Siblings, Friend Lives with:  Self Do you feel safe going back to the place where you live?  Yes Need for family participation in patient care:  Yes (Comment)  Care giving concerns: It appears pt would benefit from in home care.   Social Worker assessment / plan: Pt is an 83 year old female admitted with hallucinations which have since resolved. Met with pt today along with PT, Jeneen Rinks. Pt reports that [place] Pt states she likes to live alone and she has been in rehab twice before and did not like it. Pt states that she will not go to rehab at dc. Pt uses two quad canes to ambulate at home. She sleeps in a recliner and has a commode next to the chair. Pt states she is independent in ADL's. She states that she has two couples that live a couple miles from her that check on her. Pt states she is on O2 at home. Per pt, she has a brother that lives in [place]. PT recommending SNF/24 hour supervision for pt who is currently refusing SW offer to refer for SNF rehab. Pt stated that she would accept [facility] RN and/or PT at dc if needed. SW will follow up tomorrow to further assist with dc planning.  Employment status:  Retired Nurse, adult PT Recommendations:  [address] [place], [place] / Referral to community resources:     Patient/Family's Response to care: Pt accepting of care eventually though she is reluctant at first.  Patient/Family's Understanding of and Emotional Response to Diagnosis, Current Treatment, and Prognosis: Pt understands her diagnosis and feels that she knows what she can handle and "the man upstairs" is watching over her.   Emotional Assessment Appearance:  Appears older than stated age Attitude/Demeanor/Rapport:    Affect (typically observed):  Blunt Orientation:  Oriented to Self, Oriented to Place, Oriented to  Time, Oriented to Situation Alcohol / Substance use:    Psych involvement (Current and /or in the community):     Discharge Needs  Concerns to be addressed:    Readmission within the last 30 days:  No Current discharge risk:  Lives alone Barriers to Discharge:  Other(Pt has an open APS case)   Shade Flood, LCSW 04/15/2017, 11:35 AM

## 2017-04-15 NOTE — Plan of Care (Signed)
  Acute Rehab PT Goals(only PT should resolve) Pt will Roll Supine to Side 04/15/2017 1427 - Progressing by Lonell Grandchild, PT Flowsheets Taken 04/15/2017 1427  Pt will Roll Supine to Side with modified independence Pt Will Go Supine/Side To Sit 04/15/2017 1427 - Progressing by Lonell Grandchild, PT Flowsheets Taken 04/15/2017 1427  Pt will go Supine/Side to Sit with modified independence Patient Will Transfer Sit To/From Stand 04/15/2017 1427 - Progressing by Lonell Grandchild, PT Flowsheets Taken 04/15/2017 1427  Patient will transfer sit to/from stand with supervision Pt Will Transfer Bed To Chair/Chair To Bed 04/15/2017 1427 - Progressing by Lonell Grandchild, PT Flowsheets Taken 04/15/2017 1427  Pt will Transfer Bed to Chair/Chair to Bed with supervision Pt Will Ambulate 04/15/2017 1427 - Progressing by Lonell Grandchild, PT Flowsheets Taken 04/15/2017 1427  Pt will Ambulate 25 feet;with cane;with supervision Note Quad canes x 2   2:30 PM, 04/15/17 Lonell Grandchild, MPT Physical Therapist with [facility] [phone] office [phone] mobile phone

## 2017-04-15 NOTE — Care Management Note (Signed)
Case Management Note  Patient Details  Name: Ashley [name] MRN: [id number] Date of Birth: 1932-05-25  Expected Discharge Date:  04/15/17               Expected Discharge Plan:  [place]  In-House Referral:  Clinical Social Work  Discharge planning Services  CM Consult  Post Acute Care Choice:  Home Health Choice offered to:  Patient  HH Arranged:  RN, PT [facility] Agency:  [place]  Status of Service:  Completed, signed off  Additional Comments: anticipate DC home today per MD. Pt has been referred for SNF but refuses per CSW note. Pt will return home with resumption of HH services through [facility]. Vaughan Basta, [facility] rep, aware of plan. Pt observation and does not require resumption orders.   Sherald Barge, RN 04/15/2017, 2:29 PM

## 2017-04-15 NOTE — Progress Notes (Signed)
Ashley [name] discharged Home per MD order.  Discharge instructions reviewed and discussed with the patient, all questions and concerns answered. Copy of instructions and scripts given to patient.  Allergies as of 04/15/2017      Reactions   Ciprofloxacin Hives, Itching   Codeine Phosphate Other (See Comments)   Reaction:  Unknown    Doxycycline Hives, Itching   Erythromycin Ethylsuccinate Other (See Comments)   Reaction:  Unknown    Levofloxacin Other (See Comments)   Reaction:  Unknown    Penicillins Other (See Comments)   Reaction:  Unknown  Has patient had a PCN reaction causing immediate rash, facial/tongue/throat swelling, SOB or lightheadedness with hypotension: Unsure Has patient had a PCN reaction causing severe rash involving mucus membranes or skin necrosis: Unsure Has patient had a PCN reaction that required hospitalization Unsure Has patient had a PCN reaction occurring within the last 10 years: No If all of the above answers are "NO", then may proceed with Cephalosporin use.   Sulfamethoxazole Other (See Comments)   Reaction:  Unknown    Tetracycline Hcl Other (See Comments)   Reaction:  Unknown    Lidocaine Other (See Comments)   Reaction:  Unknown       Medication List    STOP taking these medications   HYDROcodone-acetaminophen 10-325 MG tablet Commonly known as:  NORCO   metoprolol succinate 25 MG 24 hr tablet Commonly known as:  TOPROL-XL   triamcinolone cream 0.1 % Commonly known as:  KENALOG     TAKE these medications   acetaminophen 325 MG tablet Commonly known as:  TYLENOL Take 2 tablets (650 mg total) by mouth every 6 (six) hours as needed for mild pain or headache (or Fever >/= 101).   ALPRAZolam 0.5 MG tablet Commonly known as:  XANAX Take 0.5 tablets (0.25 mg total) by mouth 2 (two) times daily as needed for anxiety. What changed:    how much to take  how to take this   aspirin EC 81 MG tablet Take 81 mg by mouth daily with lunch.   cephALEXin 250 MG capsule Commonly known as:  KEFLEX Take 1 capsule (250 mg total) by mouth 3 (three) times daily for 10 days.   Cholecalciferol 2000 units Tabs Take 1 tablet by mouth daily.   econazole nitrate 1 % cream Apply topically 2 (two) times daily. groin and abdominal folds.   ferrous sulfate 325 (65 FE) MG tablet Take 1 tablet (325 mg total) by mouth daily.   levothyroxine 75 MCG tablet Commonly known as:  SYNTHROID, LEVOTHROID Take 75 mcg by mouth daily before breakfast.   nitroGLYCERIN 0.4 MG SL tablet Commonly known as:  NITROSTAT Place 0.4 mg under the tongue every 5 (five) minutes as needed for chest pain.   nystatin powder Commonly known as:  MYCOSTATIN/NYSTOP Apply topically 3 (three) times daily.   omeprazole 20 MG capsule Commonly known as:  PRILOSEC Take 20 mg by mouth daily.   potassium chloride SA 20 MEQ tablet Commonly known as:  K-DUR,KLOR-CON Take 20 mEq by mouth daily.   silver sulfADIAZINE 1 % cream Commonly known as:  SILVADENE APPLY TO AFFECTED AREAS THREE TIMES DAILY.        IV site discontinued and catheter remains intact. Site without signs and symptoms of complications. Dressing and pressure applied.  Patient escorted to car by NT in a wheelchair,  no distress noted upon discharge.  Ashley [name] 04/15/2017 6:46 PM

## 2017-04-15 NOTE — Discharge Instructions (Signed)
Legs wound Care.: Cleanse leg with soap and water.  Dry wrap with Kerlix from below the toes to below the knees.  Secure with self adhering Coban.  Change 3 times weekly.

## 2017-04-16 DIAGNOSIS — E539 Vitamin B deficiency, unspecified: Secondary | ICD-10-CM | POA: Diagnosis not present

## 2017-04-16 DIAGNOSIS — M810 Age-related osteoporosis without current pathological fracture: Secondary | ICD-10-CM | POA: Diagnosis not present

## 2017-04-16 DIAGNOSIS — L989 Disorder of the skin and subcutaneous tissue, unspecified: Secondary | ICD-10-CM | POA: Diagnosis not present

## 2017-04-16 DIAGNOSIS — Z7982 Long term (current) use of aspirin: Secondary | ICD-10-CM | POA: Diagnosis not present

## 2017-04-16 DIAGNOSIS — C50912 Malignant neoplasm of unspecified site of left female breast: Secondary | ICD-10-CM | POA: Diagnosis not present

## 2017-04-16 DIAGNOSIS — I10 Essential (primary) hypertension: Secondary | ICD-10-CM | POA: Diagnosis not present

## 2017-04-16 DIAGNOSIS — Z170 Estrogen receptor positive status [ER+]: Secondary | ICD-10-CM | POA: Diagnosis not present

## 2017-04-16 DIAGNOSIS — Z9981 Dependence on supplemental oxygen: Secondary | ICD-10-CM | POA: Diagnosis not present

## 2017-04-16 NOTE — Clinical Social Work Note (Signed)
Pt ended up being discharged yesterday. SW did speak with APS yesterday and was informed that pt was found to be competent and if her choice is to go home, she may be released to home. APS worker, Maurie Boettcher, will be following up with pt at home next week and will attempt to work with pt on arranging for some in-home services to assist pt. RN CM arranged for [place] for pt at dc.

## 2017-04-17 ENCOUNTER — Other Ambulatory Visit: Payer: Self-pay

## 2017-04-17 ENCOUNTER — Encounter (HOSPITAL_COMMUNITY): Payer: Self-pay

## 2017-04-17 ENCOUNTER — Emergency Department (HOSPITAL_COMMUNITY)
Admission: EM | Admit: 2017-04-17 | Discharge: 2017-04-18 | Disposition: A | Discharge status: Home / Self Care | Payer: Medicare Other | Attending: Emergency Medicine | Admitting: Emergency Medicine

## 2017-04-17 ENCOUNTER — Emergency Department (HOSPITAL_COMMUNITY): Payer: Medicare Other

## 2017-04-17 DIAGNOSIS — Y999 Unspecified external cause status: Secondary | ICD-10-CM | POA: Insufficient documentation

## 2017-04-17 DIAGNOSIS — Z008 Encounter for other general examination: Secondary | ICD-10-CM | POA: Insufficient documentation

## 2017-04-17 DIAGNOSIS — M25511 Pain in right shoulder: Secondary | ICD-10-CM | POA: Diagnosis not present

## 2017-04-17 DIAGNOSIS — Y9389 Activity, other specified: Secondary | ICD-10-CM | POA: Diagnosis not present

## 2017-04-17 DIAGNOSIS — R404 Transient alteration of awareness: Secondary | ICD-10-CM | POA: Diagnosis not present

## 2017-04-17 DIAGNOSIS — E039 Hypothyroidism, unspecified: Secondary | ICD-10-CM | POA: Diagnosis not present

## 2017-04-17 DIAGNOSIS — Y929 Unspecified place or not applicable: Secondary | ICD-10-CM | POA: Diagnosis not present

## 2017-04-17 DIAGNOSIS — D649 Anemia, unspecified: Secondary | ICD-10-CM | POA: Diagnosis not present

## 2017-04-17 DIAGNOSIS — R531 Weakness: Secondary | ICD-10-CM | POA: Diagnosis not present

## 2017-04-17 DIAGNOSIS — R600 Localized edema: Secondary | ICD-10-CM | POA: Insufficient documentation

## 2017-04-17 DIAGNOSIS — W07XXXA Fall from chair, initial encounter: Secondary | ICD-10-CM | POA: Insufficient documentation

## 2017-04-17 DIAGNOSIS — W19XXXA Unspecified fall, initial encounter: Secondary | ICD-10-CM

## 2017-04-17 DIAGNOSIS — I10 Essential (primary) hypertension: Secondary | ICD-10-CM | POA: Diagnosis not present

## 2017-04-17 DIAGNOSIS — Z79899 Other long term (current) drug therapy: Secondary | ICD-10-CM | POA: Diagnosis not present

## 2017-04-17 DIAGNOSIS — M25512 Pain in left shoulder: Secondary | ICD-10-CM | POA: Diagnosis not present

## 2017-04-17 LAB — CBC WITH DIFFERENTIAL/PLATELET
Basophils Absolute: 0 10*3/uL (ref 0.0–0.1)
Basophils Relative: 0 %
EOS ABS: 0.1 10*3/uL (ref 0.0–0.7)
EOS PCT: 2 %
HCT: 39.1 % (ref 36.0–46.0)
Hemoglobin: 11.5 g/dL — ABNORMAL LOW (ref 12.0–15.0)
LYMPHS ABS: 0.8 10*3/uL (ref 0.7–4.0)
LYMPHS PCT: 11 %
MCH: 29 pg (ref 26.0–34.0)
MCHC: 29.4 g/dL — AB (ref 30.0–36.0)
MCV: 98.5 fL (ref 78.0–100.0)
MONO ABS: 0.5 10*3/uL (ref 0.1–1.0)
Monocytes Relative: 7 %
Neutro Abs: 5.9 10*3/uL (ref 1.7–7.7)
Neutrophils Relative %: 80 %
PLATELETS: 142 10*3/uL — AB (ref 150–400)
RBC: 3.97 MIL/uL (ref 3.87–5.11)
RDW: 15.2 % (ref 11.5–15.5)
WBC: 7.3 10*3/uL (ref 4.0–10.5)

## 2017-04-17 NOTE — ED Triage Notes (Signed)
Pt in by [facility] for fall out of her chair, states her family was away for the evening when she fell.  Pt c/o pain to right shoulder from the fall but is lifting and using the arm without diff.

## 2017-04-17 NOTE — ED Notes (Signed)
Checked with pt for urine sample.Pt can't go right now will check back in 30 minutes.

## 2017-04-17 NOTE — ED Provider Notes (Signed)
[facility] EMERGENCY DEPARTMENT Provider Note   CSN: [id number] Arrival date & time: 04/17/17  2223     History   Chief Complaint Chief Complaint  Patient presents with  . Fall    shoulder pain    HPI GAY MONCIVAIS is a 83 y.o. female.  HPI Patient presents to the emergency room for evaluation after falling out of her chair.  Patient states she was sitting in the chair when she accidentally slid out of the chair.  Patient states the floor was wet.  She thinks this is what caused her to slide out.  She had difficulty getting herself up.  She denies any injuries.  She does have a history of chronic leg swelling and and a history of cellulitis.  Patient denies any fevers.  No chest pain or shortness of breath.  No headache or head injury Past Medical History:  Diagnosis Date  . Acid reflux   . Arthritis   . At risk for falls   . B12 deficiency 10/17/2016  . B12 deficiency 10/17/2016  . Back pain   . Bilateral ovarian cysts 02/20/2012  . Cancer ([place])   . Edema   . Hypertension   . Left-sided breast cancer 11/14/2011   Started Arimidex on 07/25/2009. Stage IB, grade 1, ER 99%, PR 100%, Her2 negative, left-sided breast cancer, 8 mm in size, S/P lumpectomy on 04/24/2010, and sentinel node biopsy.  Ki-67 marker 6%.  . Osteoporosis 03/06/2012   Switched from Arimidex to Tamoxifen as a result of osteoporosis   . Other pancytopenia ([place]) 05/29/2014  . Thyroid disease     Patient Active Problem List   Diagnosis Date Noted  . Left leg cellulitis   . Anxiety   . Pressure injury of skin 04/14/2017  . Hallucinations 04/13/2017  . Thrombocytopenia ([place]) 04/13/2017  . Candidiasis of skin 04/13/2017  . Delirium 01/02/2017  . Cellulitis 01/02/2017  . B12 deficiency 10/17/2016  . Multiple rib fractures 03/03/2016  . Multiple closed fractures of ribs of left side   . Closed nondisplaced fracture of surgical neck of left humerus   . Rib fractures 03/02/2016  . Hypoxia 03/02/2016  .  Humerus shaft fracture 03/02/2016  . Fall as cause of accidental injury at home as place of occurrence 03/02/2016  . Iron deficiency anemia 07/08/2014  . Osteoporosis 03/06/2012  . Bilateral ovarian cysts 02/20/2012  . Left-sided breast cancer 11/14/2011  . Hypothyroidism 03/10/2007  . Essential hypertension 03/10/2007  . GERD 03/10/2007    Past Surgical History:  Procedure Laterality Date  . APPENDECTOMY    . BREAST SURGERY    . CHOLECYSTECTOMY    . HERNIA REPAIR    . HIP FRACTURE SURGERY    . TONSILLECTOMY    . TOTAL KNEE REVISION      OB History    No data available       Home Medications    Prior to Admission medications   Medication Sig Start Date End Date Taking? Authorizing Provider  acetaminophen (TYLENOL) 325 MG tablet Take 2 tablets (650 mg total) by mouth every 6 (six) hours as needed for mild pain or headache (or Fever >/= 101). 04/15/17  Yes Barton Dubois, MD  ALPRAZolam Duanne Moron) 0.5 MG tablet Take 0.5 tablets (0.25 mg total) by mouth 2 (two) times daily as needed for anxiety. 04/15/17  Yes Barton Dubois, MD  aspirin EC 81 MG tablet Take 81 mg by mouth daily with lunch.   Yes [provider]  cephALEXin (  KEFLEX) 250 MG capsule Take 1 capsule (250 mg total) by mouth 3 (three) times daily for 10 days. 04/15/17 04/25/17 Yes Barton Dubois, MD  Cholecalciferol 2000 units TABS Take 1 tablet by mouth daily.   Yes [provider]  econazole nitrate 1 % cream Apply topically 2 (two) times daily. groin and abdominal folds. 04/15/17  Yes Barton Dubois, MD  ferrous sulfate 325 (65 FE) MG tablet Take 1 tablet (325 mg total) by mouth daily. 03/06/16  Yes Dorie Rank, MD  levothyroxine (SYNTHROID, LEVOTHROID) 75 MCG tablet Take 75 mcg by mouth daily before breakfast.   Yes [provider]  nitroGLYCERIN (NITROSTAT) 0.4 MG SL tablet Place 0.4 mg under the tongue every 5 (five) minutes as needed for chest pain.    Yes [provider]    nystatin (MYCOSTATIN/NYSTOP) powder Apply topically 3 (three) times daily. 01/03/17  Yes Isaac Bliss, Rayford Halsted, MD  omeprazole (PRILOSEC) 20 MG capsule Take 20 mg by mouth daily.    Yes [provider]  potassium chloride SA (K-DUR,KLOR-CON) 20 MEQ tablet Take 20 mEq by mouth daily.   Yes [provider]  silver sulfADIAZINE (SILVADENE) 1 % cream APPLY TO AFFECTED AREAS THREE TIMES DAILY. 10/11/16  Yes Florian Buff, MD    Family History No family history on file.  Social History Social History   Tobacco Use  . Smoking status: Never Smoker  . Smokeless tobacco: Never Used  Substance Use Topics  . Alcohol use: No  . Drug use: No     Allergies   Ciprofloxacin; Codeine phosphate; Doxycycline; Erythromycin ethylsuccinate; Levofloxacin; Penicillins; Sulfamethoxazole; Tetracycline hcl; and Lidocaine   Review of Systems Review of Systems  All other systems reviewed and are negative.    Physical Exam Updated Vital Signs BP 113/66 (BP Location: Right Arm)   Pulse 72   Temp (!) 97.5 F (36.4 C) (Oral)   SpO2 100%   Physical Exam  Constitutional: No distress.  Elderly, frail  HENT:  Head: Normocephalic and atraumatic.  Right Ear: External ear normal.  Left Ear: External ear normal.  Eyes: Conjunctivae are normal. Right eye exhibits no discharge. Left eye exhibits no discharge. No scleral icterus.  Neck: Neck supple. No tracheal deviation present.  Cardiovascular: Normal rate, regular rhythm and intact distal pulses.  Pulmonary/Chest: Effort normal and breath sounds normal. No stridor. No respiratory distress. She has no wheezes. She has no rales.  Abdominal: Soft. Bowel sounds are normal. She exhibits no distension. There is no tenderness. There is no rebound and no guarding.  Musculoskeletal: She exhibits no edema or tenderness.       Right shoulder: She exhibits normal range of motion, no tenderness, no bony tenderness and no swelling.       Left  shoulder: Normal. She exhibits normal range of motion, no tenderness, no bony tenderness and no swelling.       Right wrist: She exhibits no tenderness, no bony tenderness and no swelling.       Left wrist: She exhibits no tenderness, no bony tenderness and no swelling.       Right hip: She exhibits normal range of motion, no tenderness, no bony tenderness and no swelling.       Left hip: She exhibits normal range of motion, no tenderness and no bony tenderness.       Right ankle: She exhibits no swelling. No tenderness.       Left ankle: She exhibits no swelling. No tenderness.  Cervical back: She exhibits no tenderness, no bony tenderness and no swelling.       Thoracic back: She exhibits no tenderness, no bony tenderness and no swelling.       Lumbar back: She exhibits no tenderness, no bony tenderness and no swelling.  Chronic venous stasis changes in the legs, mild erythema bilateral lower legs, patient's left shoe was completely soaked, patient's shoe was removed and her skin is macerated but no evidence of cyanosis or acute infection; kyphosis of the thoracic spine  Neurological: She is alert. She has normal strength. No cranial nerve deficit (no facial droop, extraocular movements intact, no slurred speech) or sensory deficit. She exhibits normal muscle tone. She displays no seizure activity. Coordination normal.  Skin: Skin is warm and dry. No rash noted. She is not diaphoretic.  Psychiatric: She has a normal mood and affect.  Nursing note and vitals reviewed.    ED Treatments / Results  Labs (all labs ordered are listed, but only abnormal results are displayed) Labs Reviewed  CBC WITH DIFFERENTIAL/PLATELET - Abnormal; Notable for the following components:      Result Value   Hemoglobin 11.5 (*)    MCHC 29.4 (*)    Platelets 142 (*)    All other components within normal limits  BASIC METABOLIC PANEL  URINALYSIS, ROUTINE W REFLEX MICROSCOPIC    EKG  EKG  Interpretation  Date/Time:  Thursday April 17 2017 23:02:39 EST Ventricular Rate:  64 PR Interval:    QRS Duration: 139 QT Interval:  437 QTC Calculation: 451 R Axis:   110 Text Interpretation:  Sinus rhythm Nonspecific intraventricular conduction delay Borderline ST elevation, anterolateral leads Baseline wander in lead(s) I II aVR poor  Confirmed by Dorie Rank [phone]) on 04/17/2017 11:07:07 PM       Radiology No results found.  Procedures Procedures (including critical care time)  Medications Ordered in ED Medications - No data to display   Initial Impression / Assessment and Plan / ED Course  I have reviewed the triage vital signs and the nursing notes.  Pertinent labs & imaging results that were available during my care of the patient were reviewed by me and considered in my medical decision making (see chart for details).   Pt presented to the ED after sliding out of her chair.  Sounds like she had trouble getting up.  Pt does have peripheral edema but her feet and dressings were cold.  I wonder if she actually did slip in some water.  No signs of injury.  Will check labs, make sure she can ambulate.  Dr Rolland Porter will follow up on results  Final Clinical Impressions(s) / ED Diagnoses  pending   Dorie Rank, MD 04/17/17 [phone]

## 2017-04-18 DIAGNOSIS — Z743 Need for continuous supervision: Secondary | ICD-10-CM | POA: Diagnosis not present

## 2017-04-18 DIAGNOSIS — R279 Unspecified lack of coordination: Secondary | ICD-10-CM | POA: Diagnosis not present

## 2017-04-18 LAB — BASIC METABOLIC PANEL
ANION GAP: 6 (ref 5–15)
BUN: 18 mg/dL (ref 6–20)
CO2: 34 mmol/L — AB (ref 22–32)
Calcium: 9 mg/dL (ref 8.9–10.3)
Chloride: 99 mmol/L — ABNORMAL LOW (ref 101–111)
Creatinine, Ser: 0.7 mg/dL (ref 0.44–1.00)
GFR calc Af Amer: >60 mL/min (ref >60)
Glucose, Bld: 114 mg/dL — ABNORMAL HIGH (ref 65–99)
POTASSIUM: 3.6 mmol/L (ref 3.5–5.1)
Sodium: 139 mmol/L (ref 135–145)

## 2017-04-18 LAB — URINALYSIS, ROUTINE W REFLEX MICROSCOPIC
BACTERIA UA: NONE SEEN
BILIRUBIN URINE: NEGATIVE
GLUCOSE, UA: NEGATIVE mg/dL
Ketones, ur: NEGATIVE mg/dL
LEUKOCYTES UA: NEGATIVE
Nitrite: NEGATIVE
Protein, ur: NEGATIVE mg/dL
SPECIFIC GRAVITY, URINE: 1.024 (ref 1.005–1.030)
pH: 5 (ref 5.0–8.0)

## 2017-04-18 NOTE — ED Notes (Signed)
Called family friend on emergency list to inform that pt would be coming home

## 2017-04-18 NOTE — Discharge Instructions (Signed)
Finish your antibiotics. Call your home health nurse to let her know you came to the ED tonight and to have them continue your treatment.

## 2017-04-18 NOTE — ED Provider Notes (Addendum)
Patient left at change of shift check lab results and x-rays.  Patient states she lives at home, she states she takes care of herself.  She states she normally uses a walker.  Every time I go in her room patient is sleeping in no distress.  She is very hard of hearing  Patient was admitted to the hospital for hallucinations and was found to have cellulitis of her left lower leg.  She was discharged from the hospital on November 20.  She states she is taking Keflex.  Patient is noted to have some diffuse redness of her left lower leg with some mild swelling and some warmth.  The right leg is not as involved..  Patient was ambulated by nursing staff who report patient had a very slow shuffling gait, she needed assistance getting in and out of bed and getting in and off the stretcher for the bedside commode.  Nursing staff feels she needs a social work consult.  This was placed.  She was seen by case management on November 20.  They had arranged for her to get home health.  Patient had been referred to skilled nursing facility but patient refused.  At this point patient will be discharged home.  Results for orders placed or performed during the hospital encounter of 04/17/17  CBC with Differential  Result Value Ref Range   WBC 7.3 4.0 - 10.5 K/uL   RBC 3.97 3.87 - 5.11 MIL/uL   Hemoglobin 11.5 (L) 12.0 - 15.0 g/dL   HCT 39.1 36.0 - 46.0 %   MCV 98.5 78.0 - 100.0 fL   MCH 29.0 26.0 - 34.0 pg   MCHC 29.4 (L) 30.0 - 36.0 g/dL   RDW 15.2 11.5 - 15.5 %   Platelets 142 (L) 150 - 400 K/uL   Neutrophils Relative % 80 %   Neutro Abs 5.9 1.7 - 7.7 K/uL   Lymphocytes Relative 11 %   Lymphs Abs 0.8 0.7 - 4.0 K/uL   Monocytes Relative 7 %   Monocytes Absolute 0.5 0.1 - 1.0 K/uL   Eosinophils Relative 2 %   Eosinophils Absolute 0.1 0.0 - 0.7 K/uL   Basophils Relative 0 %   Basophils Absolute 0.0 0.0 - 0.1 K/uL  Basic metabolic panel  Result Value Ref Range   Sodium 139 135 - 145 mmol/L   Potassium  3.6 3.5 - 5.1 mmol/L   Chloride 99 (L) 101 - 111 mmol/L   CO2 34 (H) 22 - 32 mmol/L   Glucose, Bld 114 (H) 65 - 99 mg/dL   BUN 18 6 - 20 mg/dL   Creatinine, Ser 0.70 0.44 - 1.00 mg/dL   Calcium 9.0 8.9 - 10.3 mg/dL   GFR calc non Af Amer >60 >60 mL/min   GFR calc Af Amer >60 >60 mL/min   Anion gap 6 5 - 15  Urinalysis, Routine w reflex microscopic  Result Value Ref Range   Color, Urine YELLOW YELLOW   APPearance HAZY (A) CLEAR   Specific Gravity, Urine 1.024 1.005 - 1.030   pH 5.0 5.0 - 8.0   Glucose, UA NEGATIVE NEGATIVE mg/dL   Hgb urine dipstick SMALL (A) NEGATIVE   Bilirubin Urine NEGATIVE NEGATIVE   Ketones, ur NEGATIVE NEGATIVE mg/dL   Protein, ur NEGATIVE NEGATIVE mg/dL   Nitrite NEGATIVE NEGATIVE   Leukocytes, UA NEGATIVE NEGATIVE   RBC / HPF 6-30 0 - 5 RBC/hpf   WBC, UA 0-5 0 - 5 WBC/hpf   Bacteria, UA NONE SEEN  NONE SEEN   Squamous Epithelial / LPF 0-5 (A) NONE SEEN   Mucus PRESENT    Hyaline Casts, UA PRESENT     Laboratory interpretation all normal except mild anemia, metabolic alkalosis, mild hyperglycemia, hematuria  Dg Chest 2 View  Result Date: 04/18/2017 CLINICAL DATA:  Weakness.  History of breast cancer. EXAM: CHEST  2 VIEW COMPARISON:  Chest radiograph 04/13/2017 FINDINGS: Examination limited by patient positioning. There is shallow lung inflation. Large hiatal hernia and elevation of the left hemidiaphragm. Chronic interstitial opacities are unchanged. No definite consolidation. IMPRESSION: Hypoinflation without definite consolidation. Chronic elevation of the left hemidiaphragm and large hiatal hernia. Electronically Signed   By: Ulyses Jarred M.D.   On: 04/18/2017 00:17   Dg Chest 2 View  Result Date: 04/13/2017 CLINICAL DATA:  Acute onset of hallucinations.  IMPRESSION: 1. Lungs mildly hypoexpanded, with scattered atelectasis. Elevation of the left hemidiaphragm. Mild vascular congestion noted. 2. Large hiatal hernia noted. Electronically Signed   By:  Garald Balding M.D.   On: 04/13/2017 01:41   Ct Head Wo Contrast  Result Date: 04/13/2017 CLINICAL DATA:  Acute onset of hallucinations. Altered level of consciousness.  IMPRESSION: 1. No acute intracranial pathology seen on CT. 2. Mild to moderate cortical volume loss and scattered small vessel ischemic microangiopathy. Electronically Signed   By: Garald Balding M.D.   On: 04/13/2017 01:39   Rolland Porter, MD, Barbette Or, MD 04/18/17 [id number]    Rolland Porter, MD 04/18/17 [phone]

## 2017-04-18 NOTE — ED Notes (Signed)
Pt changed into paper scrubs due to wet clothes. Brief also put on patient.

## 2017-04-18 NOTE — ED Notes (Signed)
Pt ammulated in the room with a walker she had a slow and shuffling gait. Pt needed one assist to get in and out of bed she also needed help getting up and down on the bsc

## 2017-04-21 ENCOUNTER — Ambulatory Visit (HOSPITAL_COMMUNITY): Payer: Medicare Other

## 2017-04-21 DIAGNOSIS — J9610 Chronic respiratory failure, unspecified whether with hypoxia or hypercapnia: Secondary | ICD-10-CM | POA: Diagnosis not present

## 2017-04-21 DIAGNOSIS — R0602 Shortness of breath: Secondary | ICD-10-CM | POA: Diagnosis not present

## 2017-04-21 DIAGNOSIS — M810 Age-related osteoporosis without current pathological fracture: Secondary | ICD-10-CM | POA: Diagnosis not present

## 2017-04-21 DIAGNOSIS — Z7982 Long term (current) use of aspirin: Secondary | ICD-10-CM | POA: Diagnosis not present

## 2017-04-21 DIAGNOSIS — I10 Essential (primary) hypertension: Secondary | ICD-10-CM | POA: Diagnosis not present

## 2017-04-21 DIAGNOSIS — Z170 Estrogen receptor positive status [ER+]: Secondary | ICD-10-CM | POA: Diagnosis not present

## 2017-04-21 DIAGNOSIS — L989 Disorder of the skin and subcutaneous tissue, unspecified: Secondary | ICD-10-CM | POA: Diagnosis not present

## 2017-04-21 DIAGNOSIS — C50912 Malignant neoplasm of unspecified site of left female breast: Secondary | ICD-10-CM | POA: Diagnosis not present

## 2017-04-21 DIAGNOSIS — R062 Wheezing: Secondary | ICD-10-CM | POA: Diagnosis not present

## 2017-04-21 DIAGNOSIS — Z9981 Dependence on supplemental oxygen: Secondary | ICD-10-CM | POA: Diagnosis not present

## 2017-04-21 DIAGNOSIS — E539 Vitamin B deficiency, unspecified: Secondary | ICD-10-CM | POA: Diagnosis not present

## 2017-04-21 LAB — URINE CULTURE
Culture: 70000 — AB
SPECIAL REQUESTS: NORMAL

## 2017-04-22 ENCOUNTER — Ambulatory Visit (HOSPITAL_COMMUNITY): Payer: Medicare Other

## 2017-04-22 ENCOUNTER — Telehealth: Payer: Self-pay | Admitting: Emergency Medicine

## 2017-04-22 DIAGNOSIS — C50912 Malignant neoplasm of unspecified site of left female breast: Secondary | ICD-10-CM | POA: Diagnosis not present

## 2017-04-22 NOTE — Telephone Encounter (Signed)
Post ED Visit - Positive Culture Follow-up  Culture report reviewed by antimicrobial stewardship pharmacist:  []  Elenor Quinones, Pharm.D. []  Heide Guile, Pharm.D., BCPS AQ-ID []  Parks Neptune, Pharm.D., BCPS []  Alycia Rossetti, Pharm.D., BCPS []  [place], Pharm.D., BCPS, AAHIVP []  Legrand Como, Pharm.D., BCPS, AAHIVP []  Salome Arnt, PharmD, BCPS []  Dimitri Ped, PharmD, BCPS []  Vincenza Hews, PharmD, BCPS Ulanda Edison PharmD  Positive urine culture Treated with none, asymptomatic,no further patient follow-up is required at this time.  Hazle Nordmann 04/22/2017, 1:42 PM

## 2017-04-24 DIAGNOSIS — Z9981 Dependence on supplemental oxygen: Secondary | ICD-10-CM | POA: Diagnosis not present

## 2017-04-24 DIAGNOSIS — M810 Age-related osteoporosis without current pathological fracture: Secondary | ICD-10-CM | POA: Diagnosis not present

## 2017-04-24 DIAGNOSIS — Z170 Estrogen receptor positive status [ER+]: Secondary | ICD-10-CM | POA: Diagnosis not present

## 2017-04-24 DIAGNOSIS — I10 Essential (primary) hypertension: Secondary | ICD-10-CM | POA: Diagnosis not present

## 2017-04-24 DIAGNOSIS — L989 Disorder of the skin and subcutaneous tissue, unspecified: Secondary | ICD-10-CM | POA: Diagnosis not present

## 2017-04-24 DIAGNOSIS — Z7982 Long term (current) use of aspirin: Secondary | ICD-10-CM | POA: Diagnosis not present

## 2017-04-24 DIAGNOSIS — E539 Vitamin B deficiency, unspecified: Secondary | ICD-10-CM | POA: Diagnosis not present

## 2017-04-24 DIAGNOSIS — C50912 Malignant neoplasm of unspecified site of left female breast: Secondary | ICD-10-CM | POA: Diagnosis not present

## 2017-04-28 ENCOUNTER — Encounter (HOSPITAL_COMMUNITY): Payer: Self-pay

## 2017-04-28 ENCOUNTER — Encounter (HOSPITAL_COMMUNITY): Payer: Medicare Other | Attending: Oncology

## 2017-04-28 VITALS — BP 92/40 | HR 69 | Temp 97.5°F | Resp 20

## 2017-04-28 DIAGNOSIS — Z170 Estrogen receptor positive status [ER+]: Secondary | ICD-10-CM | POA: Insufficient documentation

## 2017-04-28 DIAGNOSIS — E079 Disorder of thyroid, unspecified: Secondary | ICD-10-CM | POA: Insufficient documentation

## 2017-04-28 DIAGNOSIS — I10 Essential (primary) hypertension: Secondary | ICD-10-CM | POA: Insufficient documentation

## 2017-04-28 DIAGNOSIS — D509 Iron deficiency anemia, unspecified: Secondary | ICD-10-CM

## 2017-04-28 DIAGNOSIS — Z9049 Acquired absence of other specified parts of digestive tract: Secondary | ICD-10-CM | POA: Insufficient documentation

## 2017-04-28 DIAGNOSIS — M810 Age-related osteoporosis without current pathological fracture: Secondary | ICD-10-CM | POA: Insufficient documentation

## 2017-04-28 DIAGNOSIS — E538 Deficiency of other specified B group vitamins: Secondary | ICD-10-CM | POA: Diagnosis not present

## 2017-04-28 DIAGNOSIS — D61818 Other pancytopenia: Secondary | ICD-10-CM | POA: Insufficient documentation

## 2017-04-28 DIAGNOSIS — M40209 Unspecified kyphosis, site unspecified: Secondary | ICD-10-CM | POA: Insufficient documentation

## 2017-04-28 DIAGNOSIS — Z9889 Other specified postprocedural states: Secondary | ICD-10-CM | POA: Insufficient documentation

## 2017-04-28 DIAGNOSIS — C50912 Malignant neoplasm of unspecified site of left female breast: Secondary | ICD-10-CM | POA: Insufficient documentation

## 2017-04-28 MED ORDER — CYANOCOBALAMIN 1000 MCG/ML IJ SOLN
1000.0000 ug | Freq: Once | INTRAMUSCULAR | Status: AC
  Administered 2017-04-28: 1000 ug via INTRAMUSCULAR
  Filled 2017-04-28: qty 1

## 2017-04-28 NOTE — Progress Notes (Signed)
Ashley [name] presents today for injection per MD orders. B12 1059mcg administered IM in right deltoid. Administration without incident. Patient tolerated well.

## 2017-04-29 DIAGNOSIS — M810 Age-related osteoporosis without current pathological fracture: Secondary | ICD-10-CM | POA: Diagnosis not present

## 2017-04-29 DIAGNOSIS — E539 Vitamin B deficiency, unspecified: Secondary | ICD-10-CM | POA: Diagnosis not present

## 2017-04-29 DIAGNOSIS — Z7982 Long term (current) use of aspirin: Secondary | ICD-10-CM | POA: Diagnosis not present

## 2017-04-29 DIAGNOSIS — C50912 Malignant neoplasm of unspecified site of left female breast: Secondary | ICD-10-CM | POA: Diagnosis not present

## 2017-04-29 DIAGNOSIS — Z9981 Dependence on supplemental oxygen: Secondary | ICD-10-CM | POA: Diagnosis not present

## 2017-04-29 DIAGNOSIS — I10 Essential (primary) hypertension: Secondary | ICD-10-CM | POA: Diagnosis not present

## 2017-04-29 DIAGNOSIS — L989 Disorder of the skin and subcutaneous tissue, unspecified: Secondary | ICD-10-CM | POA: Diagnosis not present

## 2017-04-29 DIAGNOSIS — Z170 Estrogen receptor positive status [ER+]: Secondary | ICD-10-CM | POA: Diagnosis not present

## 2017-04-30 DIAGNOSIS — L989 Disorder of the skin and subcutaneous tissue, unspecified: Secondary | ICD-10-CM | POA: Diagnosis not present

## 2017-05-01 DIAGNOSIS — L989 Disorder of the skin and subcutaneous tissue, unspecified: Secondary | ICD-10-CM | POA: Diagnosis not present

## 2017-05-01 DIAGNOSIS — M810 Age-related osteoporosis without current pathological fracture: Secondary | ICD-10-CM | POA: Diagnosis not present

## 2017-05-01 DIAGNOSIS — E539 Vitamin B deficiency, unspecified: Secondary | ICD-10-CM | POA: Diagnosis not present

## 2017-05-01 DIAGNOSIS — Z9981 Dependence on supplemental oxygen: Secondary | ICD-10-CM | POA: Diagnosis not present

## 2017-05-01 DIAGNOSIS — Z170 Estrogen receptor positive status [ER+]: Secondary | ICD-10-CM | POA: Diagnosis not present

## 2017-05-01 DIAGNOSIS — Z7982 Long term (current) use of aspirin: Secondary | ICD-10-CM | POA: Diagnosis not present

## 2017-05-01 DIAGNOSIS — C50912 Malignant neoplasm of unspecified site of left female breast: Secondary | ICD-10-CM | POA: Diagnosis not present

## 2017-05-01 DIAGNOSIS — I10 Essential (primary) hypertension: Secondary | ICD-10-CM | POA: Diagnosis not present

## 2017-05-08 DIAGNOSIS — C50912 Malignant neoplasm of unspecified site of left female breast: Secondary | ICD-10-CM | POA: Diagnosis not present

## 2017-05-08 DIAGNOSIS — I10 Essential (primary) hypertension: Secondary | ICD-10-CM | POA: Diagnosis not present

## 2017-05-08 DIAGNOSIS — E539 Vitamin B deficiency, unspecified: Secondary | ICD-10-CM | POA: Diagnosis not present

## 2017-05-08 DIAGNOSIS — L989 Disorder of the skin and subcutaneous tissue, unspecified: Secondary | ICD-10-CM | POA: Diagnosis not present

## 2017-05-08 DIAGNOSIS — Z7982 Long term (current) use of aspirin: Secondary | ICD-10-CM | POA: Diagnosis not present

## 2017-05-08 DIAGNOSIS — M810 Age-related osteoporosis without current pathological fracture: Secondary | ICD-10-CM | POA: Diagnosis not present

## 2017-05-08 DIAGNOSIS — Z170 Estrogen receptor positive status [ER+]: Secondary | ICD-10-CM | POA: Diagnosis not present

## 2017-05-08 DIAGNOSIS — Z9981 Dependence on supplemental oxygen: Secondary | ICD-10-CM | POA: Diagnosis not present

## 2017-05-12 ENCOUNTER — Emergency Department (HOSPITAL_COMMUNITY): Payer: Medicare Other

## 2017-05-12 ENCOUNTER — Encounter (HOSPITAL_COMMUNITY): Payer: Self-pay | Admitting: Emergency Medicine

## 2017-05-12 ENCOUNTER — Emergency Department (HOSPITAL_COMMUNITY)
Admission: EM | Admit: 2017-05-12 | Discharge: 2017-05-13 | Disposition: A | Discharge status: Home / Self Care | Payer: Medicare Other | Attending: Emergency Medicine | Admitting: Emergency Medicine

## 2017-05-12 ENCOUNTER — Other Ambulatory Visit: Payer: Self-pay

## 2017-05-12 DIAGNOSIS — E039 Hypothyroidism, unspecified: Secondary | ICD-10-CM | POA: Insufficient documentation

## 2017-05-12 DIAGNOSIS — R05 Cough: Secondary | ICD-10-CM | POA: Diagnosis not present

## 2017-05-12 DIAGNOSIS — R059 Cough, unspecified: Secondary | ICD-10-CM

## 2017-05-12 DIAGNOSIS — Z7982 Long term (current) use of aspirin: Secondary | ICD-10-CM | POA: Insufficient documentation

## 2017-05-12 DIAGNOSIS — R443 Hallucinations, unspecified: Secondary | ICD-10-CM | POA: Insufficient documentation

## 2017-05-12 DIAGNOSIS — I10 Essential (primary) hypertension: Secondary | ICD-10-CM | POA: Diagnosis not present

## 2017-05-12 DIAGNOSIS — R4182 Altered mental status, unspecified: Secondary | ICD-10-CM | POA: Diagnosis present

## 2017-05-12 DIAGNOSIS — Z79899 Other long term (current) drug therapy: Secondary | ICD-10-CM | POA: Insufficient documentation

## 2017-05-12 LAB — COMPREHENSIVE METABOLIC PANEL
ALBUMIN: 2.8 g/dL — AB (ref 3.5–5.0)
ALK PHOS: 112 U/L (ref 38–126)
ALT: 10 U/L — AB (ref 14–54)
AST: 16 U/L (ref 15–41)
Anion gap: 8 (ref 5–15)
BUN: 19 mg/dL (ref 6–20)
CALCIUM: 9 mg/dL (ref 8.9–10.3)
CHLORIDE: 100 mmol/L — AB (ref 101–111)
CO2: 32 mmol/L (ref 22–32)
CREATININE: 0.7 mg/dL (ref 0.44–1.00)
GFR calc Af Amer: >60 mL/min (ref >60)
GFR calc non Af Amer: >60 mL/min (ref >60)
GLUCOSE: 82 mg/dL (ref 65–99)
Potassium: 4.5 mmol/L (ref 3.5–5.1)
SODIUM: 140 mmol/L (ref 135–145)
Total Bilirubin: 1 mg/dL (ref 0.3–1.2)
Total Protein: 5.9 g/dL — ABNORMAL LOW (ref 6.5–8.1)

## 2017-05-12 LAB — CBC WITH DIFFERENTIAL/PLATELET
BASOS ABS: 0 10*3/uL (ref 0.0–0.1)
BASOS PCT: 0 %
EOS ABS: 0 10*3/uL (ref 0.0–0.7)
Eosinophils Relative: 1 %
HCT: 39.4 % (ref 36.0–46.0)
HEMOGLOBIN: 11.7 g/dL — AB (ref 12.0–15.0)
LYMPHS ABS: 1 10*3/uL (ref 0.7–4.0)
Lymphocytes Relative: 12 %
MCH: 28.7 pg (ref 26.0–34.0)
MCHC: 29.7 g/dL — ABNORMAL LOW (ref 30.0–36.0)
MCV: 96.6 fL (ref 78.0–100.0)
Monocytes Absolute: 0.3 10*3/uL (ref 0.1–1.0)
Monocytes Relative: 4 %
NEUTROS PCT: 83 %
Neutro Abs: 7 10*3/uL (ref 1.7–7.7)
PLATELETS: 145 10*3/uL — AB (ref 150–400)
RBC: 4.08 MIL/uL (ref 3.87–5.11)
RDW: 15.1 % (ref 11.5–15.5)
WBC: 8.4 10*3/uL (ref 4.0–10.5)

## 2017-05-12 LAB — URINALYSIS, ROUTINE W REFLEX MICROSCOPIC
BILIRUBIN URINE: NEGATIVE
GLUCOSE, UA: NEGATIVE mg/dL
Hgb urine dipstick: NEGATIVE
KETONES UR: NEGATIVE mg/dL
Leukocytes, UA: NEGATIVE
Nitrite: NEGATIVE
PH: 6 (ref 5.0–8.0)
Protein, ur: NEGATIVE mg/dL
SPECIFIC GRAVITY, URINE: 1.018 (ref 1.005–1.030)

## 2017-05-12 LAB — RAPID URINE DRUG SCREEN, HOSP PERFORMED
Amphetamines: NOT DETECTED
BARBITURATES: NOT DETECTED
BENZODIAZEPINES: NOT DETECTED
COCAINE: NOT DETECTED
Opiates: POSITIVE — AB
TETRAHYDROCANNABINOL: NOT DETECTED

## 2017-05-12 LAB — ACETAMINOPHEN LEVEL: Acetaminophen (Tylenol), Serum: <10 ug/mL — ABNORMAL LOW (ref 10–30)

## 2017-05-12 LAB — TSH: TSH: 0.412 u[IU]/mL (ref 0.350–4.500)

## 2017-05-12 LAB — SALICYLATE LEVEL

## 2017-05-12 LAB — ETHANOL: Alcohol, Ethyl (B): <10 mg/dL (ref <10)

## 2017-05-12 NOTE — BH Assessment (Signed)
Tele Assessment Note   Patient Name: Ashley [name] MRN: [id number] Referring Physician: DR Oleta Mouse Location of Patient: [facility] Location of Provider: [place]  Ashley [name] is an 83 y.o. female who was brought to the [facility] after a call to LE for help as she often does. Pt could not state the exact reason she had called LE except to say she "wanted quick and effective help." Pt denied SI, HI, SHI and VH. Pt sts she does "sometimes see people." Pt sts she was not aware that she was seeing things that were not there until she sts people started telling her they did not see what she was seeing. Pt spoke about "seeing people" calmly and logically stating she has to rely on others to know when she is seeing things others do not. Pt sts she "sees things every few days." Per pt hx, over the last 3-4 months during the last 3 ED visits, pt has reported she thought someone was breaking into her house with snakes and that there were little people in her water heater and messing up her electronics. Each time she has called LE for help. During her recent medical admission in November 2018, Case Management called APS and she currently has an open case. During each of her recent ED visits, pt reported sx of hallucinations ("seeing people") and cellulitis among other medical conditions. After discharge per pt hx, pt refuses to be admitted to a SNF and only will return home. CM has been arranging for [facility] and other services to assist pt. Pt sts she does not take psychiatric medications, does not see a psychiatrist or a therapist. Pt denies all symptoms of depression and anxiety. Pt sts she sleeps through the night most nights and eats "pretty good." Pt reported that she had eaten eggs, toast and coffee today and had cooked it herself. Per pt hx, pt had experienced malnutrition a few months ago. Per pt hx, pt sleeps in a recliner with a portable toilet sitting beside it. Pt did not confirm or deny those  conditions. Per pt hx, pt ambulates with a rolling walker. Per pt hx, pt has experienced several recent falls. Pt sts she is independent with her ADLs.   Pt sts she lies alone in her home with pets. Pt's closest relatives, a sister and a brother, both live in [place] per pt hx. Pt relies on neighbors looking in on her to assist her. Per hx, pt once had a woman who came for short periods to help her with shopping and household chores until she became injured herself a few months ago.   Pt was dressed in appropriate, modest street clothes and sitting on her hospital bed covered almost completely in a blanket. Pt was extremely sleepy and had to be continually woken up by calling her name. Pt was cooperative and polite. Pt kept good eye contact when awake, spoke in a soft tone and at a slow pace. Pt moved in a normal manner when moving although she moved very little during the assessment. Pt's thought process was coherent and relevant and judgement seemed unimpaired.  Pt spoke about her VH realizing that she "sometimes" sees things others do not. No indication of delusional thinking or active response to internal stimuli. Pt's mood was stated as neither depressed nor anxious and her euthymic affect was congruent.  Pt was oriented x 3, to person, place and situation.   Diagnosis: F05 Delirium due to multiple etiologies  Past Medical  History:  Past Medical History:  Diagnosis Date  . Acid reflux   . Arthritis   . At risk for falls   . B12 deficiency 10/17/2016  . B12 deficiency 10/17/2016  . Back pain   . Bilateral ovarian cysts 02/20/2012  . Cancer ([place])   . Edema   . Hypertension   . Left-sided breast cancer 11/14/2011   Started Arimidex on 07/25/2009. Stage IB, grade 1, ER 99%, PR 100%, Her2 negative, left-sided breast cancer, 8 mm in size, S/P lumpectomy on 04/24/2010, and sentinel node biopsy.  Ki-67 marker 6%.  . Osteoporosis 03/06/2012   Switched from Arimidex to Tamoxifen as a result of  osteoporosis   . Other pancytopenia ([place]) 05/29/2014  . Thyroid disease     Past Surgical History:  Procedure Laterality Date  . APPENDECTOMY    . BREAST SURGERY    . CHOLECYSTECTOMY    . HERNIA REPAIR    . HIP FRACTURE SURGERY    . TONSILLECTOMY    . TOTAL KNEE REVISION      Family History: History reviewed. No pertinent family history.  Social History:  reports that  has never smoked. she has never used smokeless tobacco. She reports that she does not drink alcohol or use drugs.  Additional Social History:  Alcohol / Drug Use Prescriptions: SEE MAR History of alcohol / drug use?: No history of alcohol / drug abuse  CIWA: CIWA-Ar BP: (!) 90/49 Pulse Rate: 69 COWS:    PATIENT STRENGTHS: (choose at least two) Average or above average intelligence Capable of independent living Communication skills  Allergies:  Allergies  Allergen Reactions  . Ciprofloxacin Hives and Itching  . Codeine Phosphate Other (See Comments)    Reaction:  Unknown   . Doxycycline Hives and Itching  . Erythromycin Ethylsuccinate Other (See Comments)    Reaction:  Unknown   . Levofloxacin Other (See Comments)    Reaction:  Unknown   . Penicillins Other (See Comments)    Reaction:  Unknown  Has patient had a PCN reaction causing immediate rash, facial/tongue/throat swelling, SOB or lightheadedness with hypotension: Unsure Has patient had a PCN reaction causing severe rash involving mucus membranes or skin necrosis: Unsure Has patient had a PCN reaction that required hospitalization Unsure Has patient had a PCN reaction occurring within the last 10 years: No If all of the above answers are "NO", then may proceed with Cephalosporin use.  . Sulfamethoxazole Other (See Comments)    Reaction:  Unknown   . Tetracycline Hcl Other (See Comments)    Reaction:  Unknown   . Lidocaine Other (See Comments)    Reaction:  Unknown     Home Medications:  (Not in a hospital admission)  OB/GYN Status:  No  LMP recorded. Patient is postmenopausal.  General Assessment Data Location of Assessment: [facility] TTS Assessment: In system Is this a Tele or Face-to-Face Assessment?: Tele Assessment Is this an Initial Assessment or a Re-assessment for this encounter?: Initial Assessment Marital status: (UTA) Is patient pregnant?: No Pregnancy Status: No Living Arrangements: Alone Can pt return to current living arrangement?: (UNCERTAIN) Admission Status: Involuntary Is patient capable of signing voluntary admission?: Yes Referral Source: (LE) Insurance type: [facility] MEDICARE     Crisis Care Plan Living Arrangements: Alone Name of Psychiatrist: (UTA) Name of Therapist: (UTA)  Education Status Is patient currently in school?: No Highest grade of school patient has completed: (UTA)  Risk to self with the past 6 months Suicidal Ideation: No(DENIES) Has patient  been a risk to self within the past 6 months prior to admission? : No Suicidal Intent: No Has patient had any suicidal intent within the past 6 months prior to admission? : No Is patient at risk for suicide?: No Suicidal Plan?: No Has patient had any suicidal plan within the past 6 months prior to admission? : No Access to Means: (UTA) What has been your use of drugs/alcohol within the last 12 months?: (UDA CLEAR EXCEPT RX MEDS) Previous Attempts/Gestures: No(DENIES) How many times?: 0 Other Self Harm Risks: NONE REPORTED Triggers for Past Attempts: None known Intentional Self Injurious Behavior: None Family Suicide History: Unknown Recent stressful life event(s): Recent negative physical changes(REPEATED MEDICAL ADMISSIONS IN AUG & NOV) Persecutory voices/beliefs?: No Depression: No Depression Symptoms: (DENIES SYMPTOMS) Substance abuse history and/or treatment for substance abuse?: No Suicide prevention information given to non-admitted patients: Not applicable  Risk to Others within the past 6 months Homicidal Ideation:  No(DENIES) Does patient have any lifetime risk of violence toward others beyond the six months prior to admission? : No(NONE REPORTED) Thoughts of Harm to Others: No(DENIES) Current Homicidal Intent: No Current Homicidal Plan: No Access to Homicidal Means: (UTA) Identified Victim: NONE REPORTED History of harm to others?: No Assessment of Violence: None Noted Does patient have access to weapons?: (UTA) Criminal Charges Pending?: (UTA) Does patient have a court date: (UTA) Is patient on probation?: (UTA)  Psychosis Hallucinations: Visual(REPORTS "SEEING PEOPLE" IN LAST FEW ADMISSIONS) Delusions: None noted  Mental Status Report Appearance/Hygiene: Body odor, Disheveled(URINE ODOR PER EMS) Eye Contact: Fair(PT WAS FALLING ASLEEP CONTINUALLY) Motor Activity: Freedom of movement Speech: Logical/coherent, Soft, Slow Level of Consciousness: Drowsy, Quiet/awake Mood: Euthymic, Pleasant Affect: Blunted, Appropriate to circumstance Anxiety Level: None Thought Processes: Coherent, Relevant Judgement: Partial Orientation: Person, Place, Time, Situation Obsessive Compulsive Thoughts/Behaviors: Unable to Assess  Cognitive Functioning Memory: Recent Intact, Remote Intact IQ: Average Insight: Fair Impulse Control: Unable to Assess Appetite: Good(NAMED ALL SHE STS SHE ATE TODAY) Weight Loss: (UTA) Weight Gain: (UTA) Sleep: No Change Total Hours of Sleep: (STS SLEEPS THROUGH THE NIGHT) Vegetative Symptoms: Decreased grooming(PER ED NOTE, PT HAD URINE ODOR)  ADLScreening [facility] Assessment Services) Patient's cognitive ability adequate to safely complete daily activities?: Yes Patient able to express need for assistance with ADLs?: Yes Independently performs ADLs?: Yes (appropriate for developmental age)(PER HX- FALL RISK, AMBULATES W WALKER; INDEP ADLs)  Prior Inpatient Therapy Prior Inpatient Therapy: No(NO PSYCH ADMISSION NOTED)  Prior Outpatient Therapy Prior Outpatient Therapy:  (UTA) Does patient have an ACCT team?: No Does patient have Intensive In-House Services?  : No Does patient have [facility] services? : No Does patient have P4CC services?: No  ADL Screening (condition at time of admission) Patient's cognitive ability adequate to safely complete daily activities?: Yes Patient able to express need for assistance with ADLs?: Yes Independently performs ADLs?: Yes (appropriate for developmental age)(PER HX- FALL RISK, AMBULATES W WALKER; INDEP ADLs)       Abuse/Neglect Assessment (Assessment to be complete while patient is alone) Abuse/Neglect Assessment Can Be Completed: Yes Physical Abuse: (UTA) Verbal Abuse: (UTA) Sexual Abuse: (UTA) Exploitation of patient/patient's resources: (UTA) Self-Neglect: (UTA. PREVIOUS NOTES MENTION MALNUTRITION)     Advance Directives (For Healthcare) Does Patient Have a Medical Advance Directive?: (UTA) Would patient like information on creating a medical advance directive?: No - Patient declined    Additional Information 1:1 In Past 12 Months?: No CIRT Risk: No Elopement Risk: No Does patient have medical clearance?: Yes(PER DR LIU)  Disposition:  Disposition Initial Assessment Completed for this Encounter: Yes Disposition of Patient: Other dispositions(PENDING REVIEW W [facility] EXTENDER) Other disposition(s): Other (Comment)  This service was provided via telemedicine using a 2-way, interactive audio and video technology.  Names of all persons participating in this telemedicine service and their role in this encounter. Name: Faylene Kurtz, MS, [facility], [facility] Role: Triage Specialist  Name: Katianna Mcclenney Role: Patient  Name:  Role:   Name:  Role:    Per Patriciaann Clan, PA pt seems to pose no imminent harm to herself or others and pt's symptoms do not seem to be life threatening. Pt does not meet IP psychiatric admission criteria. Recommend discharging with OP referrals for OP therapy after discharge if pt is agreeable.     Spoke to Dr. Oleta Mouse, [place] at [facility]. Advised of dx, recommendation and rationale.   Faylene Kurtz, MS, [facility], [place] Triage Specialist [facility] T 05/12/2017 11:23 PM

## 2017-05-12 NOTE — ED Provider Notes (Signed)
[facility] EMERGENCY DEPARTMENT Provider Note   CSN: [id number] Arrival date & time: 05/12/17  1857     History   Chief Complaint Chief Complaint  Patient presents with  . Altered Mental Status    HPI Ashley [name] is a 83 y.o. female.  HPI 83 year old female who presents with hallucinations. She has history of thyroid disease, HTN, B12 deficiency, and arthritis.  That county police department had called Adult Protective Services to the patient home today as they have an open case against her.  IVC paperwork was filed against her due to hallucinations.  This is an ongoing problem for the patient.  She has been seen for hallucinations intermittently over the last several months in the emergency department.  She has been admitted for potential cellulitis of the leg in the past versus polypharmacy.    She states that she has been in her usual state of health.  Denies any fevers, increasing leg pain or swelling, increasing leg redness, difficulty breathing, chest pain, vomiting, diarrhea, urinary.  She has had mild cough.  When EMS arrived today, they states that she was hallucinating people in her home that were not there and trying to talk to them.  Patient states that she knows that they are not there, but that she continues to see them and speak to them.  Past Medical History:  Diagnosis Date  . Acid reflux   . Arthritis   . At risk for falls   . B12 deficiency 10/17/2016  . B12 deficiency 10/17/2016  . Back pain   . Bilateral ovarian cysts 02/20/2012  . Cancer ([place])   . Edema   . Hypertension   . Left-sided breast cancer 11/14/2011   Started Arimidex on 07/25/2009. Stage IB, grade 1, ER 99%, PR 100%, Her2 negative, left-sided breast cancer, 8 mm in size, S/P lumpectomy on 04/24/2010, and sentinel node biopsy.  Ki-67 marker 6%.  . Osteoporosis 03/06/2012   Switched from Arimidex to Tamoxifen as a result of osteoporosis   . Other pancytopenia ([place]) 05/29/2014  . Thyroid disease       Patient Active Problem List   Diagnosis Date Noted  . Left leg cellulitis   . Anxiety   . Pressure injury of skin 04/14/2017  . Hallucinations 04/13/2017  . Thrombocytopenia ([place]) 04/13/2017  . Candidiasis of skin 04/13/2017  . Delirium 01/02/2017  . Cellulitis 01/02/2017  . B12 deficiency 10/17/2016  . Multiple rib fractures 03/03/2016  . Multiple closed fractures of ribs of left side   . Closed nondisplaced fracture of surgical neck of left humerus   . Rib fractures 03/02/2016  . Hypoxia 03/02/2016  . Humerus shaft fracture 03/02/2016  . Fall as cause of accidental injury at home as place of occurrence 03/02/2016  . Iron deficiency anemia 07/08/2014  . Osteoporosis 03/06/2012  . Bilateral ovarian cysts 02/20/2012  . Left-sided breast cancer 11/14/2011  . Hypothyroidism 03/10/2007  . Essential hypertension 03/10/2007  . GERD 03/10/2007    Past Surgical History:  Procedure Laterality Date  . APPENDECTOMY    . BREAST SURGERY    . CHOLECYSTECTOMY    . HERNIA REPAIR    . HIP FRACTURE SURGERY    . TONSILLECTOMY    . TOTAL KNEE REVISION      OB History    No data available       Home Medications    Prior to Admission medications   Medication Sig Start Date End Date Taking? Authorizing Provider  acetaminophen (TYLENOL) 325 MG tablet Take 2 tablets (650 mg total) by mouth every 6 (six) hours as needed for mild pain or headache (or Fever >/= 101). 04/15/17   Barton Dubois, MD  ALPRAZolam Duanne Moron) 0.5 MG tablet Take 0.5 tablets (0.25 mg total) by mouth 2 (two) times daily as needed for anxiety. 04/15/17   Barton Dubois, MD  aspirin EC 81 MG tablet Take 81 mg by mouth daily with lunch.    [provider]  Cholecalciferol 2000 units TABS Take 1 tablet by mouth daily.    [provider]  econazole nitrate 1 % cream Apply topically 2 (two) times daily. groin and abdominal folds. 04/15/17   Barton Dubois, MD  ferrous sulfate 325 (65 FE) MG tablet Take  1 tablet (325 mg total) by mouth daily. Patient not taking: Reported on 04/28/2017 03/06/16   Dorie Rank, MD  HYDROcodone-acetaminophen [facility]) 10-325 MG tablet Take 1 tablet by mouth every 6 (six) hours as needed.  03/04/17   [provider]  levothyroxine (SYNTHROID, LEVOTHROID) 75 MCG tablet Take 75 mcg by mouth daily before breakfast.    [provider]  metoprolol succinate (TOPROL-XL) 25 MG 24 hr tablet Take 25 mg by mouth daily. 04/15/17   [provider]  nitroGLYCERIN (NITROSTAT) 0.4 MG SL tablet Place 0.4 mg under the tongue every 5 (five) minutes as needed for chest pain.     [provider]  nystatin (MYCOSTATIN/NYSTOP) powder Apply topically 3 (three) times daily. 01/03/17   Isaac Bliss, Rayford Halsted, MD  omeprazole (PRILOSEC) 20 MG capsule Take 20 mg by mouth daily.     [provider]  potassium chloride SA (K-DUR,KLOR-CON) 20 MEQ tablet Take 20 mEq by mouth daily.    [provider]  silver sulfADIAZINE (SILVADENE) 1 % cream APPLY TO AFFECTED AREAS THREE TIMES DAILY. 10/11/16   Florian Buff, MD    Family History History reviewed. No pertinent family history.  Social History Social History   Tobacco Use  . Smoking status: Never Smoker  . Smokeless tobacco: Never Used  Substance Use Topics  . Alcohol use: No  . Drug use: No     Allergies   Ciprofloxacin; Codeine phosphate; Doxycycline; Erythromycin ethylsuccinate; Levofloxacin; Penicillins; Sulfamethoxazole; Tetracycline hcl; and Lidocaine   Review of Systems Review of Systems  Constitutional: Negative for fever.  Respiratory: Negative for shortness of breath.   Cardiovascular: Negative for chest pain.  Gastrointestinal: Negative for abdominal pain.  Genitourinary: Negative for dysuria.  All other systems reviewed and are negative.    Physical Exam Updated Vital Signs BP (!) 88/46   Pulse 69   Temp 97.6 F (36.4 C) (Oral)   Resp 15   SpO2 100%    Physical Exam Physical Exam  Nursing note and vitals reviewed. Constitutional: Well developed, well nourished, non-toxic, and in no acute distress Head: Normocephalic and atraumatic.  Mouth/Throat: Oropharynx is clear and moist.  Neck: Normal range of motion. Neck supple.  Cardiovascular: Normal rate and regular rhythm.   Pulmonary/Chest: Effort normal and breath sounds normal.  Abdominal: Soft. There is no tenderness. There is no rebound and no guarding.  Musculoskeletal: Normal range of motion. Bilateral lower extremity edema with venous insufficiency, wrapped in unna boot. Neurological: Alert x 4, no facial droop, fluent speech, moves all extremities symmetrically Skin: Skin is warm and dry.  Psychiatric: Cooperative   ED Treatments / Results  Labs (all labs ordered are listed, but only abnormal results are displayed) Labs Reviewed  CBC WITH DIFFERENTIAL/PLATELET - Abnormal; Notable for the following components:      Result Value   Hemoglobin 11.7 (*)    MCHC 29.7 (*)    Platelets 145 (*)    All other components within normal limits  COMPREHENSIVE METABOLIC PANEL - Abnormal; Notable for the following components:   Chloride 100 (*)    Total Protein 5.9 (*)    Albumin 2.8 (*)    ALT 10 (*)    All other components within normal limits  RAPID URINE DRUG SCREEN, HOSP PERFORMED - Abnormal; Notable for the following components:   Opiates POSITIVE (*)    All other components within normal limits  ACETAMINOPHEN LEVEL - Abnormal; Notable for the following components:   Acetaminophen (Tylenol), Serum <10 (*)    All other components within normal limits  URINALYSIS, ROUTINE W REFLEX MICROSCOPIC  SALICYLATE LEVEL  ETHANOL  TSH    EKG  EKG Interpretation None       Radiology Dg Chest 1 View  Result Date: 05/12/2017 CLINICAL DATA:  Cough. EXAM: CHEST 1 VIEW COMPARISON:  04/17/2017 FINDINGS: Study is markedly limited by positioning. Kyphotic positioning with lower face  obscuring the right lung apex. Lungs are probably hyperexpanded. Interstitial markings are diffusely coarsened with chronic features. No gross pulmonary edema. No gross airspace disease evident. Tiny pleural effusions not excluded. Heart size probably upper normal. Lucency overlying the heart probably related to gaseous distention of the large hiatal hernia seen on the prior study. Bones diffusely demineralized. Visualized abdomen shows diffuse gaseous bowel distention. IMPRESSION: 1. Prominent lucency superimposed on the heart probably represents gaseous distention of large hiatal hernia visualized on the prior study. 2. Limited evaluation of the lungs demonstrates no gross pulmonary edema or large area of consolidation. Basilar airspace disease or small pleural effusions cannot be excluded on this study. Electronically Signed   By: Misty Stanley M.D.   On: 05/12/2017 20:34    Procedures Procedures (including critical care time)  Medications Ordered in ED Medications - No data to display   Initial Impression / Assessment and Plan / ED Course  I have reviewed the triage vital signs and the nursing notes.  Pertinent labs & imaging results that were available during my care of the patient were reviewed by me and considered in my medical decision making (see chart for details).     Patient presents with hallucinations.  Seems to be an intermittent ongoing issue for her.  She is fully oriented.  Appears to be able to do ADLs.  She acknowledges that she occasionally has hallucinations, but they are never threatening.  No SI or HI.  Today's workup reveals no major infectious causes.  Her chest x-ray and UA are unremarkable.  Her lower extremities are wrapped, but underneath reveals evidence of venous stasis dermatitis.  She is not having any recurrent symptoms of cellulitis.  No major electrolyte or metabolic derangements.  She was to be medically cleared, was evaluated by TTS.  She was not felt to  require inpatient admission for psychiatric treatment as her hallucinations do not pose harm to her or places her immediate danger.  Records are reviewed and when she has been hospitalized she has been seen by social work and case management.  Patient has refused to be admitted to skilled nursing facility, and she still states that she has no interest in being placed in SNF currently.  States that she feels that she can adequately take care of herself.  During ED course  she has no further hallucinations.  She is fully oriented.  She has demonstrates good insight. Her IVC is rescinded.   During her ED course she does have documented soft blood pressures, 94H-03U systolic.  Records are reviewed and during her hospitalization she has had blood pressures this low in the past. Given her small stature and weight, this seems normal for her.  She is mentating well.  Appears well-hydrated and has been eating and drinking normally.  At this time her mental status is clear.  She has no infectious, metabolic, electrolyte or other processes that would require admission for medical treatment. She would like to go home and I feel she is stable for discharge.   The patient appears reasonably screened and/or stabilized for discharge and I doubt any other medical condition or other [facility] requiring further screening, evaluation, or treatment in the ED at this time prior to discharge.  Strict return and follow-up instructions reviewed. She expressed understanding of all discharge instructions and felt comfortable with the plan of care.   Final Clinical Impressions(s) / ED Diagnoses   Final diagnoses:  Hallucinations    ED Discharge Orders    None       Forde Dandy, MD 05/13/17 [phone]

## 2017-05-12 NOTE — ED Triage Notes (Signed)
Pt brought in via EMS with IVC papers with county officers at side along with APS. Per EMS and APS pt having hallucinations seeing people who are not there and also EMS reports foul odor to urine.   Pt alert and oriented X4. Pt states hurts sometimes when urinating. Pt calm and cooperative at present time but APS reports she was not at home prior to coming.

## 2017-05-15 DIAGNOSIS — Z170 Estrogen receptor positive status [ER+]: Secondary | ICD-10-CM | POA: Diagnosis not present

## 2017-05-15 DIAGNOSIS — E539 Vitamin B deficiency, unspecified: Secondary | ICD-10-CM | POA: Diagnosis not present

## 2017-05-15 DIAGNOSIS — C50912 Malignant neoplasm of unspecified site of left female breast: Secondary | ICD-10-CM | POA: Diagnosis not present

## 2017-05-15 DIAGNOSIS — L989 Disorder of the skin and subcutaneous tissue, unspecified: Secondary | ICD-10-CM | POA: Diagnosis not present

## 2017-05-15 DIAGNOSIS — I10 Essential (primary) hypertension: Secondary | ICD-10-CM | POA: Diagnosis not present

## 2017-05-15 DIAGNOSIS — M810 Age-related osteoporosis without current pathological fracture: Secondary | ICD-10-CM | POA: Diagnosis not present

## 2017-05-15 DIAGNOSIS — Z9981 Dependence on supplemental oxygen: Secondary | ICD-10-CM | POA: Diagnosis not present

## 2017-05-15 DIAGNOSIS — Z7982 Long term (current) use of aspirin: Secondary | ICD-10-CM | POA: Diagnosis not present

## 2017-05-17 DIAGNOSIS — R69 Illness, unspecified: Secondary | ICD-10-CM | POA: Diagnosis not present

## 2017-05-20 ENCOUNTER — Emergency Department (HOSPITAL_COMMUNITY)
Admission: EM | Admit: 2017-05-20 | Discharge: 2017-05-20 | Disposition: A | Discharge status: Home / Self Care | Payer: Medicare Other | Source: Home / Self Care | Attending: Emergency Medicine | Admitting: Emergency Medicine

## 2017-05-20 ENCOUNTER — Emergency Department (HOSPITAL_COMMUNITY): Payer: Medicare Other

## 2017-05-20 ENCOUNTER — Encounter (HOSPITAL_COMMUNITY): Payer: Self-pay

## 2017-05-20 DIAGNOSIS — W1839XA Other fall on same level, initial encounter: Secondary | ICD-10-CM | POA: Insufficient documentation

## 2017-05-20 DIAGNOSIS — D649 Anemia, unspecified: Secondary | ICD-10-CM | POA: Diagnosis not present

## 2017-05-20 DIAGNOSIS — S40011A Contusion of right shoulder, initial encounter: Secondary | ICD-10-CM

## 2017-05-20 DIAGNOSIS — I10 Essential (primary) hypertension: Secondary | ICD-10-CM

## 2017-05-20 DIAGNOSIS — Z79890 Hormone replacement therapy (postmenopausal): Secondary | ICD-10-CM | POA: Diagnosis not present

## 2017-05-20 DIAGNOSIS — Z66 Do not resuscitate: Secondary | ICD-10-CM | POA: Diagnosis not present

## 2017-05-20 DIAGNOSIS — Z853 Personal history of malignant neoplasm of breast: Secondary | ICD-10-CM | POA: Diagnosis not present

## 2017-05-20 DIAGNOSIS — W19XXXA Unspecified fall, initial encounter: Secondary | ICD-10-CM

## 2017-05-20 DIAGNOSIS — R5381 Other malaise: Secondary | ICD-10-CM | POA: Diagnosis not present

## 2017-05-20 DIAGNOSIS — S0990XA Unspecified injury of head, initial encounter: Secondary | ICD-10-CM | POA: Diagnosis not present

## 2017-05-20 DIAGNOSIS — Z880 Allergy status to penicillin: Secondary | ICD-10-CM | POA: Diagnosis not present

## 2017-05-20 DIAGNOSIS — M25511 Pain in right shoulder: Secondary | ICD-10-CM | POA: Diagnosis not present

## 2017-05-20 DIAGNOSIS — Y92000 Kitchen of unspecified non-institutional (private) residence as  the place of occurrence of the external cause: Secondary | ICD-10-CM | POA: Insufficient documentation

## 2017-05-20 DIAGNOSIS — Y93E9 Activity, other interior property and clothing maintenance: Secondary | ICD-10-CM

## 2017-05-20 DIAGNOSIS — S81811A Laceration without foreign body, right lower leg, initial encounter: Secondary | ICD-10-CM | POA: Diagnosis not present

## 2017-05-20 DIAGNOSIS — Y999 Unspecified external cause status: Secondary | ICD-10-CM

## 2017-05-20 DIAGNOSIS — R627 Adult failure to thrive: Secondary | ICD-10-CM | POA: Diagnosis not present

## 2017-05-20 DIAGNOSIS — D696 Thrombocytopenia, unspecified: Secondary | ICD-10-CM | POA: Diagnosis not present

## 2017-05-20 DIAGNOSIS — G9340 Encephalopathy, unspecified: Secondary | ICD-10-CM | POA: Diagnosis not present

## 2017-05-20 DIAGNOSIS — M40209 Unspecified kyphosis, site unspecified: Secondary | ICD-10-CM

## 2017-05-20 DIAGNOSIS — Z7982 Long term (current) use of aspirin: Secondary | ICD-10-CM | POA: Insufficient documentation

## 2017-05-20 DIAGNOSIS — E539 Vitamin B deficiency, unspecified: Secondary | ICD-10-CM | POA: Diagnosis not present

## 2017-05-20 DIAGNOSIS — S40021A Contusion of right upper arm, initial encounter: Secondary | ICD-10-CM

## 2017-05-20 DIAGNOSIS — C50912 Malignant neoplasm of unspecified site of left female breast: Secondary | ICD-10-CM | POA: Diagnosis not present

## 2017-05-20 DIAGNOSIS — Z515 Encounter for palliative care: Secondary | ICD-10-CM | POA: Diagnosis not present

## 2017-05-20 DIAGNOSIS — J9610 Chronic respiratory failure, unspecified whether with hypoxia or hypercapnia: Secondary | ICD-10-CM | POA: Diagnosis not present

## 2017-05-20 DIAGNOSIS — Z9981 Dependence on supplemental oxygen: Secondary | ICD-10-CM | POA: Diagnosis not present

## 2017-05-20 DIAGNOSIS — L989 Disorder of the skin and subcutaneous tissue, unspecified: Secondary | ICD-10-CM | POA: Diagnosis not present

## 2017-05-20 DIAGNOSIS — T148XXA Other injury of unspecified body region, initial encounter: Secondary | ICD-10-CM | POA: Diagnosis not present

## 2017-05-20 DIAGNOSIS — E039 Hypothyroidism, unspecified: Secondary | ICD-10-CM | POA: Diagnosis not present

## 2017-05-20 DIAGNOSIS — S4991XA Unspecified injury of right shoulder and upper arm, initial encounter: Secondary | ICD-10-CM | POA: Diagnosis not present

## 2017-05-20 DIAGNOSIS — S199XXA Unspecified injury of neck, initial encounter: Secondary | ICD-10-CM | POA: Diagnosis not present

## 2017-05-20 DIAGNOSIS — Y92009 Unspecified place in unspecified non-institutional (private) residence as the place of occurrence of the external cause: Principal | ICD-10-CM

## 2017-05-20 DIAGNOSIS — E872 Acidosis: Secondary | ICD-10-CM | POA: Diagnosis not present

## 2017-05-20 DIAGNOSIS — Z79899 Other long term (current) drug therapy: Secondary | ICD-10-CM | POA: Insufficient documentation

## 2017-05-20 DIAGNOSIS — R296 Repeated falls: Secondary | ICD-10-CM | POA: Diagnosis not present

## 2017-05-20 DIAGNOSIS — R0602 Shortness of breath: Secondary | ICD-10-CM | POA: Diagnosis not present

## 2017-05-20 DIAGNOSIS — J189 Pneumonia, unspecified organism: Secondary | ICD-10-CM | POA: Diagnosis not present

## 2017-05-20 DIAGNOSIS — S90521A Blister (nonthermal), right ankle, initial encounter: Secondary | ICD-10-CM | POA: Diagnosis not present

## 2017-05-20 DIAGNOSIS — S0003XA Contusion of scalp, initial encounter: Secondary | ICD-10-CM | POA: Insufficient documentation

## 2017-05-20 DIAGNOSIS — Z6821 Body mass index (BMI) 21.0-21.9, adult: Secondary | ICD-10-CM | POA: Diagnosis not present

## 2017-05-20 DIAGNOSIS — S80821A Blister (nonthermal), right lower leg, initial encounter: Secondary | ICD-10-CM | POA: Diagnosis not present

## 2017-05-20 DIAGNOSIS — Z170 Estrogen receptor positive status [ER+]: Secondary | ICD-10-CM | POA: Diagnosis not present

## 2017-05-20 DIAGNOSIS — R062 Wheezing: Secondary | ICD-10-CM | POA: Diagnosis not present

## 2017-05-20 DIAGNOSIS — M810 Age-related osteoporosis without current pathological fracture: Secondary | ICD-10-CM | POA: Diagnosis not present

## 2017-05-20 DIAGNOSIS — J9602 Acute respiratory failure with hypercapnia: Secondary | ICD-10-CM | POA: Diagnosis not present

## 2017-05-20 DIAGNOSIS — G8929 Other chronic pain: Secondary | ICD-10-CM | POA: Diagnosis not present

## 2017-05-20 DIAGNOSIS — K219 Gastro-esophageal reflux disease without esophagitis: Secondary | ICD-10-CM | POA: Diagnosis not present

## 2017-05-20 NOTE — ED Triage Notes (Signed)
Pt called ems after she fell in the kitchen, states she was "cooking some breakfast"   Pt c/o right shoulder pain

## 2017-05-20 NOTE — ED Provider Notes (Signed)
[facility] EMERGENCY DEPARTMENT Provider Note   CSN: [id number] Arrival date & time: 05/20/17  0140  Time seen 02:30 AM   History   Chief Complaint Chief Complaint  Patient presents with  . Fall    HPI Ashley [name] is a 83 y.o. female.  HPI patient states she woke up this morning and thought it was time to get up.  She states she was cleaning her kitchen and cooking bacon and she suddenly fell to the floor.  She states she did not feel bad before she fell, she states "I was feeling really good".  She denies feeling dizzy.  When she fell she landed on her right side and she complains of pain in her right shoulder.  She states she hit the right side of her head but did not have loss of consciousness she states she was able to pull the phone down so she could call for help.  PCP Redmond [employer], MD   Past Medical History:  Diagnosis Date  . Acid reflux   . Arthritis   . At risk for falls   . B12 deficiency 10/17/2016  . B12 deficiency 10/17/2016  . Back pain   . Bilateral ovarian cysts 02/20/2012  . Cancer ([place])   . Edema   . Hypertension   . Left-sided breast cancer 11/14/2011   Started Arimidex on 07/25/2009. Stage IB, grade 1, ER 99%, PR 100%, Her2 negative, left-sided breast cancer, 8 mm in size, S/P lumpectomy on 04/24/2010, and sentinel node biopsy.  Ki-67 marker 6%.  . Osteoporosis 03/06/2012   Switched from Arimidex to Tamoxifen as a result of osteoporosis   . Other pancytopenia ([place]) 05/29/2014  . Thyroid disease     Patient Active Problem List   Diagnosis Date Noted  . Left leg cellulitis   . Anxiety   . Pressure injury of skin 04/14/2017  . Hallucinations 04/13/2017  . Thrombocytopenia ([place]) 04/13/2017  . Candidiasis of skin 04/13/2017  . Delirium 01/02/2017  . Cellulitis 01/02/2017  . B12 deficiency 10/17/2016  . Multiple rib fractures 03/03/2016  . Multiple closed fractures of ribs of left side   . Closed nondisplaced fracture of surgical neck of left  humerus   . Rib fractures 03/02/2016  . Hypoxia 03/02/2016  . Humerus shaft fracture 03/02/2016  . Fall as cause of accidental injury at home as place of occurrence 03/02/2016  . Iron deficiency anemia 07/08/2014  . Osteoporosis 03/06/2012  . Bilateral ovarian cysts 02/20/2012  . Left-sided breast cancer 11/14/2011  . Hypothyroidism 03/10/2007  . Essential hypertension 03/10/2007  . GERD 03/10/2007    Past Surgical History:  Procedure Laterality Date  . APPENDECTOMY    . BREAST SURGERY    . CHOLECYSTECTOMY    . HERNIA REPAIR    . HIP FRACTURE SURGERY    . TONSILLECTOMY    . TOTAL KNEE REVISION      OB History    No data available       Home Medications    Prior to Admission medications   Medication Sig Start Date End Date Taking? Authorizing Provider  acetaminophen (TYLENOL) 325 MG tablet Take 2 tablets (650 mg total) by mouth every 6 (six) hours as needed for mild pain or headache (or Fever >/= 101). 04/15/17   Barton Dubois, MD  ALPRAZolam Duanne Moron) 0.5 MG tablet Take 0.5 tablets (0.25 mg total) by mouth 2 (two) times daily as needed for anxiety. 04/15/17   Barton Dubois, MD  aspirin EC 81  MG tablet Take 81 mg by mouth daily with lunch.    [provider]  Cholecalciferol 2000 units TABS Take 1 tablet by mouth daily.    [provider]  econazole nitrate 1 % cream Apply topically 2 (two) times daily. groin and abdominal folds. 04/15/17   Barton Dubois, MD  ferrous sulfate 325 (65 FE) MG tablet Take 1 tablet (325 mg total) by mouth daily. Patient not taking: Reported on 04/28/2017 03/06/16   Dorie Rank, MD  HYDROcodone-acetaminophen [facility]) 10-325 MG tablet Take 1 tablet by mouth every 6 (six) hours as needed.  03/04/17   [provider]  levothyroxine (SYNTHROID, LEVOTHROID) 75 MCG tablet Take 75 mcg by mouth daily before breakfast.    [provider]  metoprolol succinate (TOPROL-XL) 25 MG 24 hr tablet Take 25 mg by mouth daily.  04/15/17   [provider]  nitroGLYCERIN (NITROSTAT) 0.4 MG SL tablet Place 0.4 mg under the tongue every 5 (five) minutes as needed for chest pain.     [provider]  nystatin (MYCOSTATIN/NYSTOP) powder Apply topically 3 (three) times daily. 01/03/17   Isaac Bliss, Rayford Halsted, MD  omeprazole (PRILOSEC) 20 MG capsule Take 20 mg by mouth daily.     [provider]  potassium chloride SA (K-DUR,KLOR-CON) 20 MEQ tablet Take 20 mEq by mouth daily.    [provider]  silver sulfADIAZINE (SILVADENE) 1 % cream APPLY TO AFFECTED AREAS THREE TIMES DAILY. 10/11/16   Florian Buff, MD    Family History No family history on file.  Social History Social History   Tobacco Use  . Smoking status: Never Smoker  . Smokeless tobacco: Never Used  Substance Use Topics  . Alcohol use: No  . Drug use: No  Lives alone Lives at home   Allergies   Ciprofloxacin; Codeine phosphate; Doxycycline; Erythromycin ethylsuccinate; Levofloxacin; Penicillins; Sulfamethoxazole; Tetracycline hcl; and Lidocaine   Review of Systems Review of Systems  All other systems reviewed and are negative.    Physical Exam Updated Vital Signs BP 105/89 (BP Location: Right Arm)   Pulse 62   Resp (!) 22   Ht '4\' 8"'  (1.422 m)   Wt 44.5 kg (98 lb)   SpO2 98%   BMI 21.97 kg/m   Vital signs normal    Physical Exam  Constitutional: She is oriented to person, place, and time.  Non-toxic appearance. She does not appear ill. No distress.  Frail elderly female  HENT:  Head: Normocephalic and atraumatic.  Right Ear: External ear normal.  Left Ear: External ear normal.  Nose: Nose normal. No mucosal edema or rhinorrhea.  Mouth/Throat: Oropharynx is clear and moist and mucous membranes are normal. No dental abscesses or uvula swelling.  Eyes: Conjunctivae and EOM are normal. Pupils are equal, round, and reactive to light.  Neck: Normal range of motion and full passive range of motion  without pain. Neck supple.  Cardiovascular: Normal rate, regular rhythm and normal heart sounds. Exam reveals no gallop and no friction rub.  No murmur heard. Pulmonary/Chest: Effort normal and breath sounds normal. No respiratory distress. She has no wheezes. She has no rhonchi. She has no rales. She exhibits no tenderness and no crepitus.  Abdominal: Normal appearance.  Musculoskeletal: She exhibits tenderness. She exhibits no edema.  Patient's right clavicle is nontender, she is tender in her right shoulder joint with some diffuse swelling.  She does not have any swelling or pain in her elbow or forearm or wrist on  the right side. Patient is noted to have marked kyphosis.  She almost has her chin on her chest.  Neurological: She is alert and oriented to person, place, and time. She has normal strength. No cranial nerve deficit.  Skin: Skin is warm, dry and intact. No rash noted. No erythema. No pallor.  Psychiatric: She has a normal mood and affect. Her speech is normal and behavior is normal. Her mood appears not anxious.  Nursing note and vitals reviewed.    ED Treatments / Results  Labs (all labs ordered are listed, but only abnormal results are displayed) Labs Reviewed - No data to display  EKG  EKG Interpretation None       Radiology Dg Chest 1 View  Result Date: 05/12/2017 CLINICAL DATA:  Cough. EXAM: CHEST 1 VIEW COMPARISON:  04/17/2017 FINDINGS: Study is markedly limited by positioning. Kyphotic positioning with lower face obscuring the right lung apex. Lungs are probably hyperexpanded. Interstitial markings are diffusely coarsened with chronic features. No gross pulmonary edema. No gross airspace disease evident. Tiny pleural effusions not excluded. Heart size probably upper normal. Lucency overlying the heart probably related to gaseous distention of the large hiatal hernia seen on the prior study. Bones diffusely demineralized. Visualized abdomen shows diffuse gaseous  bowel distention. IMPRESSION: 1. Prominent lucency superimposed on the heart probably represents gaseous distention of large hiatal hernia visualized on the prior study. 2. Limited evaluation of the lungs demonstrates no gross pulmonary edema or large area of consolidation. Basilar airspace disease or small pleural effusions cannot be excluded on this study. Electronically Signed   By: Misty Stanley M.D.   On: 05/12/2017 20:34   Dg Shoulder Right  Result Date: 05/20/2017 CLINICAL DATA:  Status post fall in kitchen, with right shoulder pain. Initial encounter. EXAM: RIGHT SHOULDER - 2+ VIEW COMPARISON:  None. FINDINGS: There is no evidence of fracture or dislocation at the right shoulder. The right humeral head is seated within the glenoid fossa. The acromioclavicular joint is unremarkable in appearance. There is question of deformity at the inferior tip of the scapula. This may be artifactual in nature. Would correlate for any inferior scapular symptoms. The visualized portions of the right lung are clear. IMPRESSION: 1. No evidence of fracture or dislocation of the right shoulder. 2. Question of deformity at the inferior tip of the right scapula. This may be artifactual in nature. Would correlate for any symptoms at the inferior scapula. Electronically Signed   By: Garald Balding M.D.   On: 05/20/2017 03:29   Ct Head Wo Contrast  Ct Cervical Spine Wo Contrast  Result Date: 05/20/2017 CLINICAL DATA:  Fall with shoulder pain EXAM: CT HEAD WITHOUT CONTRAST CT CERVICAL SPINE WITHOUT CONTRAST TECHNIQUE: Multidetector CT imaging of the head and cervical spine was performed following the standard protocol without intravenous contrast. Multiplanar CT image reconstructions of the cervical spine were also generated. COMPARISON:  Head CT 04/13/2017 FINDINGS: The examination is limited by a difficulties with patient positioning. CT HEAD FINDINGS Brain: No mass lesion, intraparenchymal hemorrhage or extra-axial  collection. No evidence of acute cortical infarct. Brain parenchyma and CSF-containing spaces are normal for age. Vascular: No hyperdense vessel or unexpected calcification. Skull: Normal visualized skull base, calvarium and extracranial soft tissues. Sinuses/Orbits: No sinus fluid levels or advanced mucosal thickening. No mastoid effusion. Normal orbits. CT CERVICAL SPINE FINDINGS Alignment: Cervical lordosis is exaggerated. There is grade 1 anterolisthesis at C2-C3 and grade 1 retrolisthesis at C3-C4. Alignment is otherwise normal. The facets are aligned.  There is rotation of C1 on C2, with slight anterior subluxation of C1 relative to C2 on the right and slight posterior subluxation on the left. This may be positional. Skull base and vertebrae: There is no acute fracture. Soft tissues and spinal canal: No prevertebral fluid or swelling. No visible canal hematoma. Disc levels: There is no bony spinal canal stenosis. Uncovertebral and facet hypertrophy contribute to multilevel neural foraminal stenosis, which is greatest at C3-4, C4-5 and C5-6 on the right. Upper chest: No pneumothorax, pulmonary nodule or pleural effusion. Other: Normal visualized paraspinal cervical soft tissues. IMPRESSION: 1. No acute intracranial abnormality. Normal appearance of the brain for age. 2. Limited examination of the cervical spine due to nonstandard patient positioning. Within that limitation, no acute cervical spine fracture is identified. 3. Rotatory subluxation at C1-C2 may be positional. Electronically Signed   By: Ulyses Jarred M.D.   On: 05/20/2017 03:54   Dg Humerus Right  Result Date: 05/20/2017 CLINICAL DATA:  Status post fall, with right shoulder pain. Question of scapular injury on shoulder radiograph. Further evaluation requested. Initial encounter. EXAM: RIGHT HUMERUS - 2+ VIEW COMPARISON:  Right shoulder radiographs performed earlier today FINDINGS: There appears to be deformity of the inferior right scapular  margin, which could reflect an acute fracture. The right humerus appears intact. The right elbow joint is grossly unremarkable in appearance. No elbow joint effusion is identified. No definite soft tissue abnormalities are characterized on radiograph. Atelectasis is noted at the right lung base. IMPRESSION: 1. Apparent deformity of the inferior right scapular margin, which could reflect acute fracture. Would correlate for associated symptoms. 2. Right humerus appears intact. Electronically Signed   By: Garald Balding M.D.   On: 05/20/2017 04:35   Procedures Procedures (including critical care time)  Medications Ordered in ED Medications - No data to display   Initial Impression / Assessment and Plan / ED Course  I have reviewed the triage vital signs and the nursing notes.  Pertinent labs & imaging results that were available during my care of the patient were reviewed by me and considered in my medical decision making (see chart for details).    X-rays were obtained of her right shoulder because that appears to be where she is the most painful, since she hit her head and she is very frail CT of the head and cervical spine were also done.  After reviewing her x-rays at 4 AM a humerus x-ray was added although she is most painful in the actual shoulder joint.  Patient was discharged home, she is not tender over the scapula, her pain appears to be more in the actual shoulder joint.  Likely she did not have a fracture hopefully she just has a contusion.   Final Clinical Impressions(s) / ED Diagnoses   Final diagnoses:  Fall in home, initial encounter  Contusion of multiple sites of right shoulder and upper arm, initial encounter  Contusion of scalp, initial encounter    ED Discharge Orders    None    OTC iacetaminophen  Plan discharge  Rolland Porter, MD, Barbette Or, MD 05/20/17 [phone]

## 2017-05-20 NOTE — Discharge Instructions (Signed)
Use ice on the injured areas. You can take acetaminophen 500 mg every 6 hrs for pain. Return to the ED for any problems on the head injury sheet.  If your shoulder is still hurting in a week or seems to be getting worse you can be rechecked by the orthopedist on-call, Dr. Aline Brochure.  Call his office to get an appointment.

## 2017-05-20 NOTE — ED Notes (Signed)
Called Josh who was not abe to pick her. He was concerned about her well being and living conditions.

## 2017-05-21 ENCOUNTER — Ambulatory Visit (HOSPITAL_COMMUNITY): Payer: Medicare Other

## 2017-05-22 DIAGNOSIS — L989 Disorder of the skin and subcutaneous tissue, unspecified: Secondary | ICD-10-CM | POA: Diagnosis not present

## 2017-05-22 DIAGNOSIS — C50912 Malignant neoplasm of unspecified site of left female breast: Secondary | ICD-10-CM | POA: Diagnosis not present

## 2017-05-22 DIAGNOSIS — Z170 Estrogen receptor positive status [ER+]: Secondary | ICD-10-CM | POA: Diagnosis not present

## 2017-05-22 DIAGNOSIS — Z9981 Dependence on supplemental oxygen: Secondary | ICD-10-CM | POA: Diagnosis not present

## 2017-05-22 DIAGNOSIS — M810 Age-related osteoporosis without current pathological fracture: Secondary | ICD-10-CM | POA: Diagnosis not present

## 2017-05-22 DIAGNOSIS — I10 Essential (primary) hypertension: Secondary | ICD-10-CM | POA: Diagnosis not present

## 2017-05-22 DIAGNOSIS — E539 Vitamin B deficiency, unspecified: Secondary | ICD-10-CM | POA: Diagnosis not present

## 2017-05-22 DIAGNOSIS — Z7982 Long term (current) use of aspirin: Secondary | ICD-10-CM | POA: Diagnosis not present

## 2017-05-23 ENCOUNTER — Inpatient Hospital Stay (HOSPITAL_COMMUNITY): Payer: Medicare Other

## 2017-05-23 ENCOUNTER — Ambulatory Visit (HOSPITAL_COMMUNITY): Payer: Medicare Other

## 2017-05-23 ENCOUNTER — Other Ambulatory Visit: Payer: Self-pay

## 2017-05-23 ENCOUNTER — Encounter (HOSPITAL_COMMUNITY): Payer: Self-pay | Admitting: Emergency Medicine

## 2017-05-23 ENCOUNTER — Inpatient Hospital Stay (HOSPITAL_COMMUNITY)
Admission: EM | Admit: 2017-05-23 | Discharge: 2017-05-27 | DRG: 070 | Disposition: E | Payer: Medicare Other | Attending: Family Medicine | Admitting: Family Medicine

## 2017-05-23 DIAGNOSIS — Z880 Allergy status to penicillin: Secondary | ICD-10-CM | POA: Diagnosis not present

## 2017-05-23 DIAGNOSIS — Z7982 Long term (current) use of aspirin: Secondary | ICD-10-CM | POA: Diagnosis not present

## 2017-05-23 DIAGNOSIS — Y92000 Kitchen of unspecified non-institutional (private) residence as  the place of occurrence of the external cause: Secondary | ICD-10-CM | POA: Diagnosis not present

## 2017-05-23 DIAGNOSIS — R627 Adult failure to thrive: Secondary | ICD-10-CM | POA: Diagnosis present

## 2017-05-23 DIAGNOSIS — S90521A Blister (nonthermal), right ankle, initial encounter: Secondary | ICD-10-CM | POA: Diagnosis present

## 2017-05-23 DIAGNOSIS — R238 Other skin changes: Secondary | ICD-10-CM

## 2017-05-23 DIAGNOSIS — Z853 Personal history of malignant neoplasm of breast: Secondary | ICD-10-CM | POA: Diagnosis not present

## 2017-05-23 DIAGNOSIS — Z66 Do not resuscitate: Secondary | ICD-10-CM | POA: Diagnosis present

## 2017-05-23 DIAGNOSIS — W1830XA Fall on same level, unspecified, initial encounter: Secondary | ICD-10-CM | POA: Diagnosis present

## 2017-05-23 DIAGNOSIS — Z515 Encounter for palliative care: Secondary | ICD-10-CM | POA: Diagnosis not present

## 2017-05-23 DIAGNOSIS — S80821A Blister (nonthermal), right lower leg, initial encounter: Secondary | ICD-10-CM | POA: Insufficient documentation

## 2017-05-23 DIAGNOSIS — J9602 Acute respiratory failure with hypercapnia: Secondary | ICD-10-CM | POA: Diagnosis not present

## 2017-05-23 DIAGNOSIS — S0003XA Contusion of scalp, initial encounter: Secondary | ICD-10-CM | POA: Diagnosis present

## 2017-05-23 DIAGNOSIS — J189 Pneumonia, unspecified organism: Secondary | ICD-10-CM | POA: Diagnosis not present

## 2017-05-23 DIAGNOSIS — T148XXA Other injury of unspecified body region, initial encounter: Secondary | ICD-10-CM | POA: Diagnosis not present

## 2017-05-23 DIAGNOSIS — R7981 Abnormal blood-gas level: Secondary | ICD-10-CM | POA: Diagnosis not present

## 2017-05-23 DIAGNOSIS — S81811A Laceration without foreign body, right lower leg, initial encounter: Secondary | ICD-10-CM | POA: Diagnosis not present

## 2017-05-23 DIAGNOSIS — R5381 Other malaise: Secondary | ICD-10-CM | POA: Diagnosis not present

## 2017-05-23 DIAGNOSIS — Z79890 Hormone replacement therapy (postmenopausal): Secondary | ICD-10-CM

## 2017-05-23 DIAGNOSIS — S85141A Laceration of anterior tibial artery, right leg, initial encounter: Secondary | ICD-10-CM | POA: Diagnosis not present

## 2017-05-23 DIAGNOSIS — R296 Repeated falls: Secondary | ICD-10-CM | POA: Diagnosis present

## 2017-05-23 DIAGNOSIS — I10 Essential (primary) hypertension: Secondary | ICD-10-CM | POA: Diagnosis not present

## 2017-05-23 DIAGNOSIS — D696 Thrombocytopenia, unspecified: Secondary | ICD-10-CM | POA: Diagnosis present

## 2017-05-23 DIAGNOSIS — Z7189 Other specified counseling: Secondary | ICD-10-CM | POA: Diagnosis not present

## 2017-05-23 DIAGNOSIS — G8929 Other chronic pain: Secondary | ICD-10-CM | POA: Diagnosis present

## 2017-05-23 DIAGNOSIS — E039 Hypothyroidism, unspecified: Secondary | ICD-10-CM | POA: Diagnosis present

## 2017-05-23 DIAGNOSIS — K219 Gastro-esophageal reflux disease without esophagitis: Secondary | ICD-10-CM | POA: Diagnosis present

## 2017-05-23 DIAGNOSIS — E872 Acidosis: Secondary | ICD-10-CM | POA: Diagnosis present

## 2017-05-23 DIAGNOSIS — D649 Anemia, unspecified: Secondary | ICD-10-CM | POA: Diagnosis not present

## 2017-05-23 DIAGNOSIS — F419 Anxiety disorder, unspecified: Secondary | ICD-10-CM | POA: Diagnosis present

## 2017-05-23 DIAGNOSIS — S40011A Contusion of right shoulder, initial encounter: Secondary | ICD-10-CM | POA: Diagnosis present

## 2017-05-23 DIAGNOSIS — G9340 Encephalopathy, unspecified: Secondary | ICD-10-CM | POA: Diagnosis not present

## 2017-05-23 DIAGNOSIS — Z6821 Body mass index (BMI) 21.0-21.9, adult: Secondary | ICD-10-CM | POA: Diagnosis not present

## 2017-05-23 LAB — BASIC METABOLIC PANEL
ANION GAP: 11 (ref 5–15)
BUN: 33 mg/dL — ABNORMAL HIGH (ref 6–20)
CHLORIDE: 103 mmol/L (ref 101–111)
CO2: 31 mmol/L (ref 22–32)
Calcium: 9.2 mg/dL (ref 8.9–10.3)
Creatinine, Ser: 0.91 mg/dL (ref 0.44–1.00)
GFR calc non Af Amer: 57 mL/min — ABNORMAL LOW (ref >60)
Glucose, Bld: 68 mg/dL (ref 65–99)
POTASSIUM: 4.1 mmol/L (ref 3.5–5.1)
Sodium: 145 mmol/L (ref 135–145)

## 2017-05-23 LAB — CBC WITH DIFFERENTIAL/PLATELET
BASOS PCT: 0 %
Basophils Absolute: 0 10*3/uL (ref 0.0–0.1)
Eosinophils Absolute: 0 10*3/uL (ref 0.0–0.7)
Eosinophils Relative: 1 %
HEMATOCRIT: 37.4 % (ref 36.0–46.0)
HEMOGLOBIN: 11 g/dL — AB (ref 12.0–15.0)
LYMPHS ABS: 0.9 10*3/uL (ref 0.7–4.0)
Lymphocytes Relative: 12 %
MCH: 28.8 pg (ref 26.0–34.0)
MCHC: 29.4 g/dL — AB (ref 30.0–36.0)
MCV: 97.9 fL (ref 78.0–100.0)
MONOS PCT: 5 %
Monocytes Absolute: 0.4 10*3/uL (ref 0.1–1.0)
NEUTROS ABS: 6 10*3/uL (ref 1.7–7.7)
NEUTROS PCT: 82 %
Platelets: 107 10*3/uL — ABNORMAL LOW (ref 150–400)
RBC: 3.82 MIL/uL — ABNORMAL LOW (ref 3.87–5.11)
RDW: 15.6 % — ABNORMAL HIGH (ref 11.5–15.5)
WBC: 7.3 10*3/uL (ref 4.0–10.5)

## 2017-05-23 LAB — BRAIN NATRIURETIC PEPTIDE: B NATRIURETIC PEPTIDE 5: 292 pg/mL — AB (ref 0.0–100.0)

## 2017-05-23 LAB — AMMONIA: AMMONIA: 26 umol/L (ref 9–35)

## 2017-05-23 LAB — TSH: TSH: 1.545 u[IU]/mL (ref 0.350–4.500)

## 2017-05-23 MED ORDER — ASPIRIN EC 81 MG PO TBEC: 81.0000 mg | DELAYED_RELEASE_TABLET | Freq: Every day | ORAL | Status: DC

## 2017-05-23 MED ORDER — ONDANSETRON HCL 4 MG PO TABS: 4.0000 mg | ORAL_TABLET | Freq: Four times a day (QID) | ORAL | Status: DC | PRN

## 2017-05-23 MED ORDER — ACETAMINOPHEN 325 MG PO TABS: 650.0000 mg | ORAL_TABLET | Freq: Four times a day (QID) | ORAL | Status: DC | PRN

## 2017-05-23 MED ORDER — ACETAMINOPHEN 650 MG RE SUPP: 650.0000 mg | Freq: Four times a day (QID) | RECTAL | Status: DC | PRN

## 2017-05-23 MED ORDER — ALPRAZOLAM 0.25 MG PO TABS: 0.2500 mg | ORAL_TABLET | Freq: Two times a day (BID) | ORAL | Status: DC | PRN

## 2017-05-23 MED ORDER — SENNOSIDES-DOCUSATE SODIUM 8.6-50 MG PO TABS: 1.0000 | ORAL_TABLET | Freq: Every evening | ORAL | Status: DC | PRN

## 2017-05-23 MED ORDER — PANTOPRAZOLE SODIUM 40 MG PO TBEC: 40.0000 mg | DELAYED_RELEASE_TABLET | Freq: Every day | ORAL | Status: DC

## 2017-05-23 MED ORDER — VITAMIN D 1000 UNITS PO TABS: 2000.0000 [IU] | ORAL_TABLET | Freq: Every day | ORAL | Status: DC

## 2017-05-23 MED ORDER — HYDROCODONE-ACETAMINOPHEN 10-325 MG PO TABS
1.0000 | ORAL_TABLET | Freq: Four times a day (QID) | ORAL | Status: DC | PRN
Start: 2017-05-23 — End: 2017-05-24
  Administered 2017-05-24: 1 via ORAL
  Filled 2017-05-23: qty 1

## 2017-05-23 MED ORDER — ALBUTEROL SULFATE (2.5 MG/3ML) 0.083% IN NEBU: 2.5000 mg | INHALATION_SOLUTION | Freq: Four times a day (QID) | RESPIRATORY_TRACT | Status: DC | PRN

## 2017-05-23 MED ORDER — LEVOTHYROXINE SODIUM 75 MCG PO TABS: 75.0000 ug | ORAL_TABLET | Freq: Every day | ORAL | Status: DC

## 2017-05-23 MED ORDER — HYDROCODONE-ACETAMINOPHEN 10-325 MG PO TABS: 1.0000 | ORAL_TABLET | Freq: Four times a day (QID) | ORAL | Status: DC | PRN

## 2017-05-23 MED ORDER — BISACODYL 5 MG PO TBEC: 5.0000 mg | DELAYED_RELEASE_TABLET | Freq: Every day | ORAL | Status: DC | PRN

## 2017-05-23 MED ORDER — ONDANSETRON HCL 4 MG/2ML IJ SOLN: 4.0000 mg | Freq: Four times a day (QID) | INTRAMUSCULAR | Status: DC | PRN

## 2017-05-23 MED ORDER — SODIUM CHLORIDE 0.9 % IV SOLN: INTRAVENOUS | Status: DC

## 2017-05-23 NOTE — ED Triage Notes (Signed)
EMS called to the home, Pt lives alone, neighbor checked in on patient and right leg laceration for 12/25 due to fall, is draining.  Pt has a case worker [employer] [phone], per Ems multiple called to the home, states pt is failure to thrive

## 2017-05-23 NOTE — H&P (Addendum)
History and Physical    Ashley [name]:[id number] DOB: 12/23/1931 DOA: 05/25/2017  PCP: Redmond School, MD   Patient coming from: Home  Chief Complaint: Right leg wound  HPI: Ashley [name] is a 83 y.o. female with medical history significant for hypertension, breast cancer, anemia, thrombocytopenia, anxiety disorder, hypothyroidism, and chronic pain, now presenting to the emergency department for evaluation of a right leg wound and confusion.  Fell on 05/20/2017, was evaluated in the emergency department at that time with unremarkable head and cervical spine CT, and discharged back home.  A neighbor who has been keeping an eye on her noted that her right leg wound draining fluid and called EMS out for evaluation.  Patient is confused reportedly found in [place] living condition where she resides alone.  Leg was wrapped and she was transported to the ED.  Patient is unable to contribute much to the history due to her confusion.  Baseline mental status is not entirely clear, though when she was discharged from the hospital last month, the delirium that she presented with had reportedly resolved completely.  He was discharged home with home health.  She has had frequent ED visits over the past month.  There is concern that she is unable to care for herself and does not have any local family.  Social work is involved in her case.  ED Course: Upon arrival to the ED, patient is found to be afebrile, saturating well on her usual 2 L/min of supplemental oxygen, with blood pressure of 91/52, and vitals otherwise stable.  Is notable for an elevated BUN to creatinine ratio and CBC features slight worsening in her chronic normocytic anemia and thrombocytopenia.  Urinalysis was ordered and remains pending.  Patient has not been in any apparent respiratory distress and blood pressure remains slightly low, but stable.  She will be admitted to the medical-surgical unit for ongoing evaluation and management of  leg wound and acute encephalopathy.  Review of Systems:  All other systems reviewed and apart from HPI, are negative.  Past Medical History:  Diagnosis Date  . Acid reflux   . Arthritis   . At risk for falls   . B12 deficiency 10/17/2016  . B12 deficiency 10/17/2016  . Back pain   . Bilateral ovarian cysts 02/20/2012  . Cancer ([place])   . Edema   . Hypertension   . Left-sided breast cancer 11/14/2011   Started Arimidex on 07/25/2009. Stage IB, grade 1, ER 99%, PR 100%, Her2 negative, left-sided breast cancer, 8 mm in size, S/P lumpectomy on 04/24/2010, and sentinel node biopsy.  Ki-67 marker 6%.  . Osteoporosis 03/06/2012   Switched from Arimidex to Tamoxifen as a result of osteoporosis   . Other pancytopenia ([place]) 05/29/2014  . Thyroid disease     Past Surgical History:  Procedure Laterality Date  . APPENDECTOMY    . BREAST SURGERY    . CHOLECYSTECTOMY    . HERNIA REPAIR    . HIP FRACTURE SURGERY    . TONSILLECTOMY    . TOTAL KNEE REVISION       reports that  has never smoked. she has never used smokeless tobacco. She reports that she does not drink alcohol or use drugs.  Allergies  Allergen Reactions  . Ciprofloxacin Hives and Itching  . Codeine Phosphate Other (See Comments)    Reaction:  Unknown   . Doxycycline Hives and Itching  . Erythromycin Ethylsuccinate Other (See Comments)    Reaction:  Unknown   .  Levofloxacin Other (See Comments)    Reaction:  Unknown   . Penicillins Other (See Comments)    Reaction:  Unknown  Has patient had a PCN reaction causing immediate rash, facial/tongue/throat swelling, SOB or lightheadedness with hypotension: Unsure Has patient had a PCN reaction causing severe rash involving mucus membranes or skin necrosis: Unsure Has patient had a PCN reaction that required hospitalization Unsure Has patient had a PCN reaction occurring within the last 10 years: No If all of the above answers are "NO", then may proceed with Cephalosporin use.  .  Sulfamethoxazole Other (See Comments)    Reaction:  Unknown   . Tetracycline Hcl Other (See Comments)    Reaction:  Unknown   . Lidocaine Other (See Comments)    Reaction:  Unknown     History reviewed. No pertinent family history.   Prior to Admission medications   Medication Sig Start Date End Date Taking? Authorizing Provider  acetaminophen (TYLENOL) 325 MG tablet Take 2 tablets (650 mg total) by mouth every 6 (six) hours as needed for mild pain or headache (or Fever >/= 101). 04/15/17   Barton Dubois, MD  ALPRAZolam Duanne Moron) 0.5 MG tablet Take 0.5 tablets (0.25 mg total) by mouth 2 (two) times daily as needed for anxiety. 04/15/17   Barton Dubois, MD  aspirin EC 81 MG tablet Take 81 mg by mouth daily with lunch.    [provider]  Cholecalciferol 2000 units TABS Take 1 tablet by mouth daily.    [provider]  econazole nitrate 1 % cream Apply topically 2 (two) times daily. groin and abdominal folds. 04/15/17   Barton Dubois, MD  ferrous sulfate 325 (65 FE) MG tablet Take 1 tablet (325 mg total) by mouth daily. Patient not taking: Reported on 04/28/2017 03/06/16   Dorie Rank, MD  HYDROcodone-acetaminophen [facility]) 10-325 MG tablet Take 1 tablet by mouth every 6 (six) hours as needed.  03/04/17   [provider]  levothyroxine (SYNTHROID, LEVOTHROID) 75 MCG tablet Take 75 mcg by mouth daily before breakfast.    [provider]  metoprolol succinate (TOPROL-XL) 25 MG 24 hr tablet Take 25 mg by mouth daily. 04/15/17   [provider]  nitroGLYCERIN (NITROSTAT) 0.4 MG SL tablet Place 0.4 mg under the tongue every 5 (five) minutes as needed for chest pain.     [provider]  nystatin (MYCOSTATIN/NYSTOP) powder Apply topically 3 (three) times daily. 01/03/17   Isaac Bliss, Rayford Halsted, MD  omeprazole (PRILOSEC) 20 MG capsule Take 20 mg by mouth daily.     [provider]  potassium chloride SA (K-DUR,KLOR-CON) 20 MEQ tablet  Take 20 mEq by mouth daily.    [provider]  silver sulfADIAZINE (SILVADENE) 1 % cream APPLY TO AFFECTED AREAS THREE TIMES DAILY. 10/11/16   Florian Buff, MD    Physical Exam: Vitals:   05/26/2017 1736 05/13/2017 1737 05/22/2017 1830  BP:  (!) 96/58 (!) 91/52  Pulse:  75 74  Resp:  18   Temp:  97.7 F (36.5 C)   TempSrc:  Oral   SpO2:  100% 96%  Weight: 44.5 kg (98 lb)    Height: '4\' 8"'  (1.422 m)        Constitutional: NAD, calm, somnolent, easily roused, intermittently confused Eyes: PERTLA, lids and conjunctivae normal ENMT: Mucous membranes are moist. Posterior pharynx clear of any exudate or lesions.   Neck: normal, supple, no masses, no thyromegaly Respiratory: clear to auscultation bilaterally, no wheezing, no  crackles. No accessory muscle use.  Cardiovascular: S1 & S2 heard, regular rate and rhythm. No significant JVD. Abdomen: No distension, no tenderness, no masses palpated. Bowel sounds normal.  Musculoskeletal: no clubbing / cyanosis. Marked kyphosis.  Skin: ecchymoses and laceration to lower right leg; large bulla at lower right leg. Poor turgor. Neurologic: No gross facial asymmetry. Sensation intact. Moving all extremties.  Psychiatric: Somnolent, easily roused. Intermittently confused.      Labs on Admission: I have personally reviewed following labs and imaging studies  CBC: Recent Labs  Lab 05/12/2017 1839  WBC 7.3  NEUTROABS 6.0  HGB 11.0*  HCT 37.4  MCV 97.9  PLT 326*   Basic Metabolic Panel: Recent Labs  Lab 05/01/2017 1839  NA 145  K 4.1  CL 103  CO2 31  GLUCOSE 68  BUN 33*  CREATININE 0.91  CALCIUM 9.2   GFR: Estimated Creatinine Clearance: 29.3 mL/min (by C-G formula based on SCr of 0.91 mg/dL). Liver Function Tests: No results for input(s): AST, ALT, ALKPHOS, BILITOT, PROT, ALBUMIN in the last 168 hours. No results for input(s): LIPASE, AMYLASE in the last 168 hours. No results for input(s): AMMONIA in the last 168  hours. Coagulation Profile: No results for input(s): INR, PROTIME in the last 168 hours. Cardiac Enzymes: No results for input(s): CKTOTAL, CKMB, CKMBINDEX, TROPONINI in the last 168 hours. BNP (last 3 results) No results for input(s): PROBNP in the last 8760 hours. HbA1C: No results for input(s): HGBA1C in the last 72 hours. CBG: No results for input(s): GLUCAP in the last 168 hours. Lipid Profile: No results for input(s): CHOL, HDL, LDLCALC, TRIG, CHOLHDL, LDLDIRECT in the last 72 hours. Thyroid Function Tests: No results for input(s): TSH, T4TOTAL, FREET4, T3FREE, THYROIDAB in the last 72 hours. Anemia Panel: No results for input(s): VITAMINB12, FOLATE, FERRITIN, TIBC, IRON, RETICCTPCT in the last 72 hours. Urine analysis:    Component Value Date/Time   COLORURINE YELLOW 05/12/2017 2124   APPEARANCEUR CLEAR 05/12/2017 2124   LABSPEC 1.018 05/12/2017 2124   PHURINE 6.0 05/12/2017 2124   GLUCOSEU NEGATIVE 05/12/2017 2124   HGBUR NEGATIVE 05/12/2017 2124   BILIRUBINUR NEGATIVE 05/12/2017 2124   KETONESUR NEGATIVE 05/12/2017 2124   PROTEINUR NEGATIVE 05/12/2017 2124   UROBILINOGEN 0.2 12/26/2012 1100   NITRITE NEGATIVE 05/12/2017 2124   LEUKOCYTESUR NEGATIVE 05/12/2017 2124   Sepsis Labs: '@LABRCNTIP' (procalcitonin:4,lacticidven:4) )No results found for this or any previous visit (from the past 240 hour(s)).   Radiological Exams on Admission: No results found.  EKG: Not performed.   Assessment/Plan  ADDENDUM: pt developed increased O2-requirement in ED, not in any apparent distress. CXR with right-sided opacity concerning for asymmetric edema vs PNA. BNP is elevated. Plan to check procalcitonin, strep pneumo urine antigen, sputum culture. SLIV and follow daily wts and I/O's.   1. Acute encephalopathy  - Presents for evaluation of right leg wound, found to be somnolent and confused  - No focal neurologic deficits identified; head CT 12/25 without acute findings  - Check  thyroid studies, B12, folate, ammonia, RPR  - Minimize sedating medications, continue supportive care    2. Right leg wound  - Presents for evaluation of right lower leg laceration and bulla  - Secondary to trauma from recurrent falls at home  - Does not appear to be infected on admission and no evidence for underlying fracture  - Wound care consultation requested    3. Hypertension  - SBP has been in the 90's - Continue to hold metoprolol  4. Chronic pain  - Stable  - Minimize opiates in light of #1    5. Anxiety disorder  - Stable  - Continue reduced-dose prn Xanax    6. Anemia; thrombocytopenia  - Hgb is 11.0 and platelets 107,000 on admission  - No obvious bleeding; do not anticipate transfusion requirements  - Managed with iron-supplements at home    7. Hypothyroidism  - Check thyroid studies given confusion - Continue Synthroid     DVT prophylaxis: SCD's Code Status: Full  Family Communication: Unable to reach family  Disposition Plan: Admit to med-surg Consults called: None Admission status: Inpatient   Vianne Bulls, MD [facility] Pager [phone]  If 7PM-7AM, please contact night-coverage [email or web address] Password [email or web address]  05/18/2017, 8:50 PM

## 2017-05-23 NOTE — ED Notes (Signed)
At bedside with EDP to unwrap legs bilaterally. Right has open lac,draining and large blister at ankle. Left red and warm no swelling.

## 2017-05-23 NOTE — ED Provider Notes (Signed)
[facility] EMERGENCY DEPARTMENT Provider Note   CSN: [id number] Arrival date & time: 05/16/2017  1728     History   Chief Complaint Chief Complaint  Patient presents with  . Wound Check    HPI Ashley [name] is a 83 y.o. female.  Patient here for evaluation of a right leg wound.  She is unable to specify when it happened.  A neighbor who watches over her found her today with fluid dripping from her right leg, and called EMS.  EMS visualize the wound on the right leg, and wrapped it with bandages, for transfer.  Level 5 caveat-altered mental status  HPI  Past Medical History:  Diagnosis Date  . Acid reflux   . Arthritis   . At risk for falls   . B12 deficiency 10/17/2016  . B12 deficiency 10/17/2016  . Back pain   . Bilateral ovarian cysts 02/20/2012  . Cancer ([place])   . Edema   . Hypertension   . Left-sided breast cancer 11/14/2011   Started Arimidex on 07/25/2009. Stage IB, grade 1, ER 99%, PR 100%, Her2 negative, left-sided breast cancer, 8 mm in size, S/P lumpectomy on 04/24/2010, and sentinel node biopsy.  Ki-67 marker 6%.  . Osteoporosis 03/06/2012   Switched from Arimidex to Tamoxifen as a result of osteoporosis   . Other pancytopenia ([place]) 05/29/2014  . Thyroid disease     Patient Active Problem List   Diagnosis Date Noted  . Acute encephalopathy 05/18/2017  . Normocytic anemia 05/04/2017  . Failure to thrive in adult 05/14/2017  . Blister of right lower leg   . Laceration of anterior tibial artery, right leg, initial encounter   . Left leg cellulitis   . Anxiety   . Pressure injury of skin 04/14/2017  . Hallucinations 04/13/2017  . Thrombocytopenia ([place]) 04/13/2017  . Candidiasis of skin 04/13/2017  . Delirium 01/02/2017  . Cellulitis 01/02/2017  . B12 deficiency 10/17/2016  . Multiple rib fractures 03/03/2016  . Multiple closed fractures of ribs of left side   . Closed nondisplaced fracture of surgical neck of left humerus   . Rib fractures 03/02/2016   . Hypoxia 03/02/2016  . Humerus shaft fracture 03/02/2016  . Fall as cause of accidental injury at home as place of occurrence 03/02/2016  . Iron deficiency anemia 07/08/2014  . Osteoporosis 03/06/2012  . Bilateral ovarian cysts 02/20/2012  . Left-sided breast cancer 11/14/2011  . Hypothyroidism 03/10/2007  . Essential hypertension 03/10/2007  . GERD 03/10/2007    Past Surgical History:  Procedure Laterality Date  . APPENDECTOMY    . BREAST SURGERY    . CHOLECYSTECTOMY    . HERNIA REPAIR    . HIP FRACTURE SURGERY    . TONSILLECTOMY    . TOTAL KNEE REVISION      OB History    No data available       Home Medications    Prior to Admission medications   Medication Sig Start Date End Date Taking? Authorizing Provider  acetaminophen (TYLENOL) 325 MG tablet Take 2 tablets (650 mg total) by mouth every 6 (six) hours as needed for mild pain or headache (or Fever >/= 101). 04/15/17   Barton Dubois, MD  ALPRAZolam Duanne Moron) 0.5 MG tablet Take 0.5 tablets (0.25 mg total) by mouth 2 (two) times daily as needed for anxiety. 04/15/17   Barton Dubois, MD  aspirin EC 81 MG tablet Take 81 mg by mouth daily with lunch.    [provider]  Cholecalciferol  2000 units TABS Take 1 tablet by mouth daily.    [provider]  econazole nitrate 1 % cream Apply topically 2 (two) times daily. groin and abdominal folds. 04/15/17   Barton Dubois, MD  ferrous sulfate 325 (65 FE) MG tablet Take 1 tablet (325 mg total) by mouth daily. Patient not taking: Reported on 04/28/2017 03/06/16   Dorie Rank, MD  HYDROcodone-acetaminophen [facility]) 10-325 MG tablet Take 1 tablet by mouth every 6 (six) hours as needed.  03/04/17   [provider]  levothyroxine (SYNTHROID, LEVOTHROID) 75 MCG tablet Take 75 mcg by mouth daily before breakfast.    [provider]  metoprolol succinate (TOPROL-XL) 25 MG 24 hr tablet Take 25 mg by mouth daily. 04/15/17   [provider]    nitroGLYCERIN (NITROSTAT) 0.4 MG SL tablet Place 0.4 mg under the tongue every 5 (five) minutes as needed for chest pain.     [provider]  nystatin (MYCOSTATIN/NYSTOP) powder Apply topically 3 (three) times daily. 01/03/17   Isaac Bliss, Rayford Halsted, MD  omeprazole (PRILOSEC) 20 MG capsule Take 20 mg by mouth daily.     [provider]  potassium chloride SA (K-DUR,KLOR-CON) 20 MEQ tablet Take 20 mEq by mouth daily.    [provider]  silver sulfADIAZINE (SILVADENE) 1 % cream APPLY TO AFFECTED AREAS THREE TIMES DAILY. 10/11/16   Florian Buff, MD    Family History History reviewed. No pertinent family history.  Social History Social History   Tobacco Use  . Smoking status: Never Smoker  . Smokeless tobacco: Never Used  Substance Use Topics  . Alcohol use: No  . Drug use: No     Allergies   Ciprofloxacin; Codeine phosphate; Doxycycline; Erythromycin ethylsuccinate; Levofloxacin; Penicillins; Sulfamethoxazole; Tetracycline hcl; and Lidocaine   Review of Systems Review of Systems  Unable to perform ROS: Mental status change     Physical Exam Updated Vital Signs BP (!) 91/52   Pulse 74   Temp 97.7 F (36.5 C) (Oral)   Resp 18   Ht _0  (1.422 m)   Wt 44.5 kg (98 lb)   SpO2 96%   BMI 21.97 kg/m   Physical Exam  Constitutional: She is oriented to person, place, and time. She appears well-developed. She appears distressed (She is uncomfortable).  Frail, elderly, slumped, forward into the right in bed.  HENT:  Head: Normocephalic and atraumatic.  She is drooling somewhat purulent appearing mucus from her mouth.  Eyes: Conjunctivae and EOM are normal. Pupils are equal, round, and reactive to light.  Neck: Normal range of motion and phonation normal.  No discomfort with passive neck extension  Cardiovascular: Normal rate and regular rhythm.  Pulmonary/Chest: Effort normal and breath sounds normal. She exhibits no tenderness.   Abdominal: Soft. She exhibits no distension. There is no tenderness. There is no guarding.  Musculoskeletal:  Extreme kyphosis thoracic, with head forward, touching chest.  She is unable to elevate arms and legs off the stretcher, unclear if she is understanding the request.  Laceration right upper anterior tibial region, gaping, appears subacute.  There is a small amount of bleeding.  Large bulla right lower medial leg, with clear fluid in it.  There is no deformity of the right tibia.  Neurological: She is alert and oriented to person, place, and time. She exhibits normal muscle tone.  Poor historian, no dysarthria or aphasia.  Skin: Skin is warm and dry.  Mild erythema of the anterior left lower leg.  Psychiatric:  Lethargic, confused responses to questions.  Nursing note and vitals reviewed.    ED Treatments / Results  Labs (all labs ordered are listed, but only abnormal results are displayed) Labs Reviewed  BASIC METABOLIC PANEL - Abnormal; Notable for the following components:      Result Value   BUN 33 (*)    GFR calc non Af Amer 57 (*)    All other components within normal limits  CBC WITH DIFFERENTIAL/PLATELET - Abnormal; Notable for the following components:   RBC 3.82 (*)    Hemoglobin 11.0 (*)    MCHC 29.4 (*)    RDW 15.6 (*)    Platelets 107 (*)    All other components within normal limits  URINE CULTURE  TSH  AMMONIA  URINALYSIS, ROUTINE W REFLEX MICROSCOPIC  BASIC METABOLIC PANEL  CBC WITH DIFFERENTIAL/PLATELET  T4, FREE  VITAMIN B12  FOLATE RBC  RPR  HIV ANTIBODY (ROUTINE TESTING)  VITAMIN B12  T4, FREE  FOLATE RBC    EKG  EKG Interpretation None       Radiology No results found.  Procedures Procedures (including critical care time)  Medications Ordered in ED Medications  ALPRAZolam (XANAX) tablet 0.25 mg (not administered)  aspirin EC tablet 81 mg (not administered)  Cholecalciferol TABS 2,000 Units (not administered)  levothyroxine  (SYNTHROID, LEVOTHROID) tablet 75 mcg (not administered)  pantoprazole (PROTONIX) EC tablet 40 mg (not administered)  0.9 %  sodium chloride infusion (not administered)  acetaminophen (TYLENOL) tablet 650 mg (not administered)    Or  acetaminophen (TYLENOL) suppository 650 mg (not administered)  senna-docusate (Senokot-S) tablet 1 tablet (not administered)  bisacodyl (DULCOLAX) EC tablet 5 mg (not administered)  ondansetron (ZOFRAN) tablet 4 mg (not administered)    Or  ondansetron (ZOFRAN) injection 4 mg (not administered)  HYDROcodone-acetaminophen (NORCO) 10-325 MG per tablet 1 tablet (not administered)     Initial Impression / Assessment and Plan / ED Course  I have reviewed the triage vital signs and the nursing notes.  Pertinent labs & imaging results that were available during my care of the patient were reviewed by me and considered in my medical decision making (see chart for details).  Clinical Course as of May 23 2146  Fri May 23, 2017  2024 I had a telephone conversation with the patient's friend, Eustaquio Maize, listed as the patient's contact.  She is a friend who lives across the street, from the patient.  She reports that the patient has been doing poorly and gradually worse over the last month with confusion both day and night, general weakness, inability to care for self, inability to cook and clean, leaving her house "filthy."  She has a Education officer, museum who was attempting to get a protection order to place her into a facility.  She has an upcoming appointment with her primary care doctor on January 3, for initiation of placement.  A home health nurse sees her daily, last yesterday, when her legs were apparently rewrapped.  Today Beth found the patient with an injury to her right leg, with the patient confused and unable to tell her what happened, or when  Apparently, the patient's brother was visiting from [place] but left last night or early this morning.  [EW]    Clinical Course User  Index [EW] Daleen Bo, MD     Patient Vitals for the past 24 hrs:  BP Temp Temp src Pulse Resp SpO2 Height Weight  05/21/2017 1830 (!) 91/52 - - 74 -  96 % - -  05/14/2017 1737 (!) 96/58 97.7 F (36.5 C) Oral 75 18 100 % - -  05/20/2017 1736 - - - - - - _0  (1.422 m) 44.5 kg (98 lb)    8:31 PM Reevaluation with update and discussion. After initial assessment and treatment, an updated evaluation reveals no change in clinical status. Daleen Bo   8:31 PM-Consult complete with hospitalist. Patient case explained and discussed.  He agrees to admit patient for further evaluation and treatment. Call ended at 8:36 PM   Final Clinical Impressions(s) / ED Diagnoses   Final diagnoses:  Malaise  Blister of right lower leg, initial encounter  Laceration of right lower leg, initial encounter    Patient with confusion, gradual onset, worsening, despite attempts to treat her as an outpatient.  She had injuries to the right leg, at some point within the last 24 hours, however they are not amenable to repair in the ED.  Screening labs, without significant abnormality, urinalysis pending.  Patient lives alone, and is at significant risk, for death  if she is discharged.  She likely has psychiatric and mental health decompensation, which will require placement.  These avenues have both been approached, through the ED, several times this month, without successful interventions.  Therefore I will seek admission for placement.  Nursing Notes Reviewed/ Care Coordinated Applicable Imaging Reviewed Interpretation of Laboratory Data incorporated into ED treatment   Plan : [place]  ED Discharge Orders    None       Daleen Bo, MD 05/17/2017 2148

## 2017-05-23 NOTE — ED Notes (Signed)
Lab at the bedside 

## 2017-05-23 NOTE — ED Notes (Signed)
Pt's o2 was in the 70's on 6L o2. Pt reported that she did feel short of breath at the time. Pt c/o left shoulder pain from previous fall. This RN called Dr. Myna Hidalgo about pt's BP and o2. Orders received for chest xray. Order then changed to a portable xray. Respiratory came and rechecked pt's o2 after putting the pulse ox on her ear. Pt's o2 now 100% on 4L.

## 2017-05-24 ENCOUNTER — Encounter (HOSPITAL_COMMUNITY): Payer: Self-pay

## 2017-05-24 DIAGNOSIS — R627 Adult failure to thrive: Secondary | ICD-10-CM

## 2017-05-24 DIAGNOSIS — R5381 Other malaise: Secondary | ICD-10-CM

## 2017-05-24 DIAGNOSIS — S80821A Blister (nonthermal), right lower leg, initial encounter: Secondary | ICD-10-CM

## 2017-05-24 DIAGNOSIS — I10 Essential (primary) hypertension: Secondary | ICD-10-CM

## 2017-05-24 DIAGNOSIS — G9340 Encephalopathy, unspecified: Principal | ICD-10-CM

## 2017-05-24 DIAGNOSIS — R7981 Abnormal blood-gas level: Secondary | ICD-10-CM

## 2017-05-24 DIAGNOSIS — Z66 Do not resuscitate: Secondary | ICD-10-CM

## 2017-05-24 DIAGNOSIS — Z7189 Other specified counseling: Secondary | ICD-10-CM

## 2017-05-24 LAB — T4, FREE: Free T4: 0.93 ng/dL (ref 0.61–1.12)

## 2017-05-24 LAB — CBC WITH DIFFERENTIAL/PLATELET
BASOS ABS: 0 10*3/uL (ref 0.0–0.1)
Basophils Relative: 0 %
EOS PCT: 0 %
Eosinophils Absolute: 0 10*3/uL (ref 0.0–0.7)
HEMATOCRIT: 42.2 % (ref 36.0–46.0)
Hemoglobin: 11.8 g/dL — ABNORMAL LOW (ref 12.0–15.0)
LYMPHS ABS: 0.6 10*3/uL — AB (ref 0.7–4.0)
LYMPHS PCT: 10 %
MCH: 28.8 pg (ref 26.0–34.0)
MCHC: 28 g/dL — AB (ref 30.0–36.0)
MCV: 102.9 fL — AB (ref 78.0–100.0)
MONO ABS: 0.5 10*3/uL (ref 0.1–1.0)
MONOS PCT: 7 %
NEUTROS ABS: 5.3 10*3/uL (ref 1.7–7.7)
Neutrophils Relative %: 83 %
PLATELETS: 125 10*3/uL — AB (ref 150–400)
RBC: 4.1 MIL/uL (ref 3.87–5.11)
RDW: 15.9 % — AB (ref 11.5–15.5)
WBC: 6.4 10*3/uL (ref 4.0–10.5)

## 2017-05-24 LAB — URINALYSIS, ROUTINE W REFLEX MICROSCOPIC
BILIRUBIN URINE: NEGATIVE
Glucose, UA: NEGATIVE mg/dL
Hgb urine dipstick: NEGATIVE
KETONES UR: 5 mg/dL — AB
Leukocytes, UA: NEGATIVE
NITRITE: NEGATIVE
PH: 5 (ref 5.0–8.0)
Protein, ur: NEGATIVE mg/dL
Specific Gravity, Urine: 1.024 (ref 1.005–1.030)

## 2017-05-24 LAB — PROCALCITONIN
Procalcitonin: 0.1 ng/mL
Procalcitonin: <0.1 ng/mL

## 2017-05-24 LAB — BASIC METABOLIC PANEL
ANION GAP: 11 (ref 5–15)
ANION GAP: 9 (ref 5–15)
BUN: 33 mg/dL — AB (ref 6–20)
BUN: 34 mg/dL — ABNORMAL HIGH (ref 6–20)
CALCIUM: 9.1 mg/dL (ref 8.9–10.3)
CHLORIDE: 101 mmol/L (ref 101–111)
CO2: 29 mmol/L (ref 22–32)
CO2: 32 mmol/L (ref 22–32)
Calcium: 9.1 mg/dL (ref 8.9–10.3)
Chloride: 101 mmol/L (ref 101–111)
Creatinine, Ser: 0.99 mg/dL (ref 0.44–1.00)
Creatinine, Ser: 0.94 mg/dL (ref 0.44–1.00)
GFR calc Af Amer: 59 mL/min — ABNORMAL LOW (ref >60)
GFR, EST NON AFRICAN AMERICAN: 51 mL/min — AB (ref >60)
GFR, EST NON AFRICAN AMERICAN: 55 mL/min — AB (ref >60)
GLUCOSE: 81 mg/dL (ref 65–99)
Glucose, Bld: 78 mg/dL (ref 65–99)
POTASSIUM: 4.2 mmol/L (ref 3.5–5.1)
Potassium: 4.1 mmol/L (ref 3.5–5.1)
Sodium: 141 mmol/L (ref 135–145)
Sodium: 142 mmol/L (ref 135–145)

## 2017-05-24 LAB — STREP PNEUMONIAE URINARY ANTIGEN: STREP PNEUMO URINARY ANTIGEN: NEGATIVE

## 2017-05-24 LAB — LACTIC ACID, PLASMA: LACTIC ACID, VENOUS: 0.9 mmol/L (ref 0.5–1.9)

## 2017-05-24 LAB — VITAMIN B12: Vitamin B-12: 2279 pg/mL — ABNORMAL HIGH (ref 180–914)

## 2017-05-24 MED ORDER — MORPHINE SULFATE (CONCENTRATE) 10 MG/0.5ML PO SOLN: 5.0000 mg | ORAL | Status: DC | PRN

## 2017-05-24 MED ORDER — NALOXONE HCL 0.4 MG/ML IJ SOLN: 0.4000 mg | INTRAMUSCULAR | Status: DC | PRN

## 2017-05-24 MED ORDER — HALOPERIDOL LACTATE 2 MG/ML PO CONC
0.5000 mg | ORAL | Status: DC | PRN
  Filled 2017-05-24: qty 0.3

## 2017-05-24 MED ORDER — HALOPERIDOL 0.5 MG PO TABS
0.5000 mg | ORAL_TABLET | ORAL | Status: DC | PRN
  Filled 2017-05-24: qty 1

## 2017-05-24 MED ORDER — IPRATROPIUM-ALBUTEROL 0.5-2.5 (3) MG/3ML IN SOLN: 3.0000 mL | Freq: Four times a day (QID) | RESPIRATORY_TRACT | Status: DC

## 2017-05-24 MED ORDER — VANCOMYCIN HCL IN DEXTROSE 750-5 MG/150ML-% IV SOLN
750.0000 mg | INTRAVENOUS | Status: DC
  Filled 2017-05-24 (×2): qty 150

## 2017-05-24 MED ORDER — HALOPERIDOL LACTATE 5 MG/ML IJ SOLN: 0.5000 mg | INTRAMUSCULAR | Status: DC | PRN

## 2017-05-24 MED ORDER — VANCOMYCIN HCL IN DEXTROSE 1-5 GM/200ML-% IV SOLN
1000.0000 mg | Freq: Once | INTRAVENOUS | Status: AC
  Administered 2017-05-24: 1000 mg via INTRAVENOUS
  Filled 2017-05-24: qty 200

## 2017-05-24 MED ORDER — LORAZEPAM 1 MG PO TABS: 1.0000 mg | ORAL_TABLET | ORAL | Status: DC | PRN

## 2017-05-24 MED ORDER — LORAZEPAM 2 MG/ML IJ SOLN
1.0000 mg | INTRAMUSCULAR | Status: DC | PRN
  Administered 2017-05-24: 1 mg via INTRAVENOUS
  Filled 2017-05-24: qty 1

## 2017-05-25 LAB — URINE CULTURE: Culture: NO GROWTH

## 2017-05-25 LAB — RPR: RPR Ser Ql: NONREACTIVE

## 2017-05-25 LAB — HIV ANTIBODY (ROUTINE TESTING W REFLEX): HIV Screen 4th Generation wRfx: NONREACTIVE

## 2017-05-27 NOTE — ED Notes (Signed)
Pt resting peacefully at this time. Pt is in nad. Vitals are WNL.

## 2017-05-27 NOTE — Progress Notes (Signed)
Patient's sister called to notify that she and her brother stated the patient's funeral home preference and theirs' was [facility] in [place].

## 2017-05-27 NOTE — ED Notes (Signed)
Dr Smith Mince speaking with family

## 2017-05-27 NOTE — ED Notes (Signed)
Date and time results received: 05-27-17 8:56 AM  (use smartphrase ".now" to insert current time)  Test: ABG  Critical Value:  pH 6.988 PCO2 Above reportable value. PO2 153 Not bicarbs reported  Name of Provider Notified: Lacinda Axon  Orders Received? Or Actions Taken?: Orders Received - See Orders for details

## 2017-05-27 NOTE — Progress Notes (Signed)
Received ABG results reviewed and order for continuous BiPap and transfer to SDU placed.

## 2017-05-27 NOTE — Progress Notes (Signed)
Late entry: ABG results (pH 6.988, pCO2 is above reportable range, pO2 153 and sO2 97.6%)  given by phone to B. Patraw, RN at [phone]. I am unable to enter results as lab is currently processing patient specimens

## 2017-05-27 NOTE — Progress Notes (Signed)
PROGRESS NOTE    Ashley [name]  VOH:[id number] DOB: 1931-10-08 DOA: 05/12/2017 PCP: Redmond School, MD    Brief Narrative:  Ashley [name] is a 84 y.o. female with medical history significant for hypertension, breast cancer, anemia, thrombocytopenia, anxiety disorder, hypothyroidism, and chronic pain, now presenting to the emergency department for evaluation of a right leg wound and confusion.  Fell on 05/20/2017, was evaluated in the emergency department at that time with unremarkable head and cervical spine CT, and discharged back home.  A neighbor who has been keeping an eye on her noted that her right leg wound draining fluid and called EMS out for evaluation.  Patient is confused reportedly found in [place] living condition where she resides alone.  Leg was wrapped and she was transported to the ED.  Patient is unable to contribute much to the history due to her confusion.  Baseline mental status is not entirely clear, though when she was discharged from the hospital last month, the delirium that she presented with had reportedly resolved completely.  He was discharged home with home health.  She has had frequent ED visits over the past month.  There is concern that she is unable to care for herself and does not have any local family.  Social work is involved in her case.  ED Course: Upon arrival to the ED, patient is found to be afebrile, saturating well on her usual 2 L/min of supplemental oxygen, with blood pressure of 91/52, and vitals otherwise stable.  Is notable for an elevated BUN to creatinine ratio and CBC features slight worsening in her chronic normocytic anemia and thrombocytopenia.  Urinalysis was ordered and remains pending.  Patient has not been in any apparent respiratory distress and blood pressure remains slightly low, but stable.  She will be admitted to the medical-surgical unit for ongoing evaluation and management of leg wound and acute encephalopathy.     Assessment  & Plan:   Principal Problem:   Acute encephalopathy Active Problems:   Hypothyroidism   Essential hypertension   Thrombocytopenia (HCC)   Anxiety   Normocytic anemia   Failure to thrive in adult  Goals of care -Discussed at length with patient's sister and brother about changing patient to a DNR from a full code as patient has numerous medical comorbidities and is likely not a candidate for any aggressive interventions - Patient's brother and sister agreed to changing patient to a DO NOT RESUSCITATE - Patient has been seen in the emergency department numerous times in the past 6 months for falls or altered mental status.  It appears as though she has refused skilled nursing facility in the past and patient's family states that she has refused moving in with them.  They voiced that patient is an independent woman who likes to do things on her terms -Social work consult placed although unclear as to whether or not patient will survive hospitalization - May benefit from hospice home  Acute encephalopathy  - Patient minimally responsive on exam this morning. -Holding opiates and benzodiazepines -ABG, lactic acid, arterial ultrasounds of lower legs bilaterally ordered stat -Starting vancomycin and Zosyn for concerns of pneumonia -Follow-up on  urine culture -Ordered blood cultures x2 next line-this is been a recurring problem for patient and she has had numerous ER presentations in the past month for falls, hallucinations or altered mental status  Right leg wound  - Significantly large bullae on right lower extremity -Lower extremities cool to touch and mottled -Vascular ultrasound ordered  stat although if patient were to have any type of occlusive disease she is likely not a candidate for any aggressive interventions  Hypertension  - Systolic blood pressure is low normal range -Holding antihypertensives  Chronic pain  - Patient has been taking hydrocodone at home, her brother  voices that he believes this could be contributing to her hallucinations - Holding pain medication given patient's altered mental status   Anxiety disorder  - Unclear as to whether or not patient has been taking medication as needed because urine drug screen showing negative for benzodiazepines  Anemia; thrombocytopenia  - H&H stable from previous lab draw  Hypothyroidism  - TSH within normal limits -Holding Synthroid till patient able to tolerate p.o.    DVT prophylaxis: SCDs Code Status: DNR- discussed with patient's brother and sister that patient has significant medical problems and that her decompensation  Family Communication: discussed at length with patient's brother and her sister about changing patient's code status to DNR.  At this time patient would likely not survive a code and if she were to survive she has no reserve as she is extremely malnourished and has numerous medical problems.  Family voices patient has had a significant decline in the past 6 months especially and essentially is not eating at home and still hallucinating.  Patient's brother was just in [place] over Christmas to see patient and voices that she did not eat almost at all over the holiday and that she was hallucinating frequently.  He states that patient has become "skin and bones".  Both patient's siblings agree to having patient become a DNR and to avoid pain for patient.  Agreed to try to treat the treatable but to not do any extraordinary measures Disposition Plan: guarded   Consultants:   Wound care  Procedures:   none  Antimicrobials:  Zosyn and Vancomycine  Subjective: Seen in the emergency department after being called by nursing staff over concerns for decreased respirations and mottled lower extremity.  This is a change from when patient was admitted.  Patient is nonresponsive, unable to awake to painful stimuli.   Objective: Vitals:   June 03, 2017 0530 June 03, 2017 0700 06-03-2017 0715 06-03-2017  0730  BP: 105/63 (!) 107/50 (!) 113/50 (!) 106/51  Pulse: 77 65 66 64  Resp: 18 10 10 10   Temp:      TempSrc:      SpO2: 100% 100% 100% 100%  Weight:      Height:       No intake or output data in the 24 hours ending 06/03/2017 0752 Filed Weights   05/17/2017 1736  Weight: 44.5 kg (98 lb)    Examination:  General exam: Appears calm and comfortable, cachectic elderly female Respiratory system: Rales throughout the right lung field and at left lung base, agonal breathing. Cardiovascular system: S1 & S2 heard, bradycardic, No JVD, murmurs, rubs, gallops or clicks. No pedal edema. Gastrointestinal system: Abdomen is nondistended, soft and nontender. No organomegaly or masses felt. Normal bowel sounds heard. Central nervous system: Not responsive Extremities: mottled cool lower extremities bilaterally with large bullae on right lower extremity (see pictures)        Skin: Significant erythema of the lower extremities bilaterally with large bullae on the right medial lower leg, cool to touch of lower legs bilaterally, mottled lower extremities bilaterally, skin changes of hands and wrists bilaterally Psychiatry: Unable to evaluate as patient is not responsive    Data Reviewed: I have personally reviewed following labs and imaging studies  CBC: Recent Labs  Lab 05/22/2017 1839 06/04/2017 0527  WBC 7.3 6.4  NEUTROABS 6.0 5.3  HGB 11.0* 11.8*  HCT 37.4 42.2  MCV 97.9 102.9*  PLT 107* 443*   Basic Metabolic Panel: Recent Labs  Lab 05/02/2017 1839  NA 145  K 4.1  CL 103  CO2 31  GLUCOSE 68  BUN 33*  CREATININE 0.91  CALCIUM 9.2   GFR: Estimated Creatinine Clearance: 29.3 mL/min (by C-G formula based on SCr of 0.91 mg/dL). Liver Function Tests: No results for input(s): AST, ALT, ALKPHOS, BILITOT, PROT, ALBUMIN in the last 168 hours. No results for input(s): LIPASE, AMYLASE in the last 168 hours. Recent Labs  Lab 05/03/2017 2058  AMMONIA 26   Coagulation Profile: No  results for input(s): INR, PROTIME in the last 168 hours. Cardiac Enzymes: No results for input(s): CKTOTAL, CKMB, CKMBINDEX, TROPONINI in the last 168 hours. BNP (last 3 results) No results for input(s): PROBNP in the last 8760 hours. HbA1C: No results for input(s): HGBA1C in the last 72 hours. CBG: No results for input(s): GLUCAP in the last 168 hours. Lipid Profile: No results for input(s): CHOL, HDL, LDLCALC, TRIG, CHOLHDL, LDLDIRECT in the last 72 hours. Thyroid Function Tests: Recent Labs    05/19/2017 2058  TSH 1.545   Anemia Panel: No results for input(s): VITAMINB12, FOLATE, FERRITIN, TIBC, IRON, RETICCTPCT in the last 72 hours. Sepsis Labs: Recent Labs  Lab 04/26/2017 1839  PROCALCITON 0.10    No results found for this or any previous visit (from the past 240 hour(s)).       Radiology Studies: Dg Chest Port 1 View  Result Date: 05/22/2017 CLINICAL DATA:  Low O2 sats EXAM: PORTABLE CHEST 1 VIEW COMPARISON:  05/12/2017, 04/07/2017, 01/02/2017 FINDINGS: The study is limited by positioning and kyphosis. Worsening opacity in the right greater than left thorax. No large effusion. Stable cardiomediastinal silhouette. Re- demonstrated large hiatal hernia. IMPRESSION: Worsening opacity within the right greater than left thorax, may reflect asymmetric edema or diffuse pneumonia. Limited study secondary to kyphosis and habitus. Electronically Signed   By: Donavan Foil M.D.   On: 05/05/2017 23:00        Scheduled Meds: . aspirin EC  81 mg Oral Q lunch  . Cholecalciferol  1 tablet Oral Daily  . levothyroxine  75 mcg Oral QAC breakfast  . pantoprazole  40 mg Oral Daily   Continuous Infusions:   LOS: 1 day    Time spent: 65 minutes of critical care time    Loretha Stapler, MD [facility] Pager [phone]  If 7PM-7AM, please contact night-coverage [email or web address] Password [email or web address] 06-04-2017, 7:52 AM

## 2017-05-27 NOTE — Discharge Summary (Signed)
Death Summary  Ashley [name] IWP:[id number] DOB: [birth year] DOA: 06-02-2017  PCP: Redmond School, MD  Admit date: 2017-06-02 Date of Death: 2017/06/03 Time of Death: September 01, 1533 Notification: Redmond School, MD notified of death of 2017-06-03   History of present illness:  Ashley [name] is a 84 y.o. female with a history significant forhypertension, breast cancer, anemia, thrombocytopenia, anxiety disorder, hypothyroidism, and chronic pain, now presenting to the emergency department for evaluation of a right leg wound and confusion. Fell on 05/20/2017, was evaluated in the emergency department at that time with unremarkable head and cervical spine CT, and discharged back home. A neighbor who has been keeping an eye on her noted that her right leg wound draining fluid and called EMS out for evaluation. Patient is confused reportedly found in [facility] living condition where she resides alone. Leg was wrapped and she was transported to the ED. Patient is unable to contribute much to the history due to her confusion. Baseline mental status is not entirely clear,though when she was discharged from the hospital last month, the delirium that she presented with had reportedly resolved completely. He was discharged home with home health. She has had frequent ED visits over the past month. There is concern that she is unable to care for herself and does not have any local family. Social work is involved in her case.  ED Course:Upon arrival to the ED, patient is found to be afebrile, saturating well on her usual 2 L/min of supplemental oxygen, with blood pressure of 91/52, and vitals otherwise stable. Is notable for an elevated BUN to creatinine ratio and CBC features slight worsening in her chronic normocytic anemia and thrombocytopenia. Urinalysis was ordered and remains pending. Patient has not been in any apparent respiratory distress and blood pressure remains slightly low, but stable. She  will be admitted to the medical-surgical unit for ongoing evaluation and management of leg wound and acute encephalopathy.  Patient was evaluated in the emergency department in the morning on 06/03/17.  She was still unresponsive and her lower extremities began to show signs of limited perfusion.  Her brother and sister were notified of her poor condition and the decision was made to make patient a DO NOT RESUSCITATE.  She was started on broad-spectrum antibiotics for possible pneumonia and ABG showed significant respiratory acidosis.  She was placed on BiPAP and transferred to the stepdown unit.  In the afternoon on 03-Jun-2017 decision between patient's siblings and provider to transition patient to comfort based measures only.  BiPAP was removed and she was placed on morphine and Ativan as needed.  Patient expired on 1535 on 2017-06-03  Final Diagnoses:  1.   Acute hypercapnic respiratory failure   The results of significant diagnostics from this hospitalization (including imaging, microbiology, ancillary and laboratory) are listed below for reference.    Significant Diagnostic Studies: Dg Chest 1 View  Result Date: 05/12/2017 CLINICAL DATA:  Cough. EXAM: CHEST 1 VIEW COMPARISON:  04/17/2017 FINDINGS: Study is markedly limited by positioning. Kyphotic positioning with lower face obscuring the right lung apex. Lungs are probably hyperexpanded. Interstitial markings are diffusely coarsened with chronic features. No gross pulmonary edema. No gross airspace disease evident. Tiny pleural effusions not excluded. Heart size probably upper normal. Lucency overlying the heart probably related to gaseous distention of the large hiatal hernia seen on the prior study. Bones diffusely demineralized. Visualized abdomen shows diffuse gaseous bowel distention. IMPRESSION: 1. Prominent lucency superimposed on the heart probably represents gaseous distention of large hiatal hernia  visualized on the prior study. 2.  Limited evaluation of the lungs demonstrates no gross pulmonary edema or large area of consolidation. Basilar airspace disease or small pleural effusions cannot be excluded on this study. Electronically Signed   By: Misty Stanley M.D.   On: 05/12/2017 20:34   Dg Shoulder Right  Result Date: 05/20/2017 CLINICAL DATA:  Status post fall in kitchen, with right shoulder pain. Initial encounter. EXAM: RIGHT SHOULDER - 2+ VIEW COMPARISON:  None. FINDINGS: There is no evidence of fracture or dislocation at the right shoulder. The right humeral head is seated within the glenoid fossa. The acromioclavicular joint is unremarkable in appearance. There is question of deformity at the inferior tip of the scapula. This may be artifactual in nature. Would correlate for any inferior scapular symptoms. The visualized portions of the right lung are clear. IMPRESSION: 1. No evidence of fracture or dislocation of the right shoulder. 2. Question of deformity at the inferior tip of the right scapula. This may be artifactual in nature. Would correlate for any symptoms at the inferior scapula. Electronically Signed   By: Garald Balding M.D.   On: 05/20/2017 03:29   Ct Head Wo Contrast  Result Date: 05/20/2017 CLINICAL DATA:  Fall with shoulder pain EXAM: CT HEAD WITHOUT CONTRAST CT CERVICAL SPINE WITHOUT CONTRAST TECHNIQUE: Multidetector CT imaging of the head and cervical spine was performed following the standard protocol without intravenous contrast. Multiplanar CT image reconstructions of the cervical spine were also generated. COMPARISON:  Head CT 04/13/2017 FINDINGS: The examination is limited by a difficulties with patient positioning. CT HEAD FINDINGS Brain: No mass lesion, intraparenchymal hemorrhage or extra-axial collection. No evidence of acute cortical infarct. Brain parenchyma and CSF-containing spaces are normal for age. Vascular: No hyperdense vessel or unexpected calcification. Skull: Normal visualized skull base,  calvarium and extracranial soft tissues. Sinuses/Orbits: No sinus fluid levels or advanced mucosal thickening. No mastoid effusion. Normal orbits. CT CERVICAL SPINE FINDINGS Alignment: Cervical lordosis is exaggerated. There is grade 1 anterolisthesis at C2-C3 and grade 1 retrolisthesis at C3-C4. Alignment is otherwise normal. The facets are aligned. There is rotation of C1 on C2, with slight anterior subluxation of C1 relative to C2 on the right and slight posterior subluxation on the left. This may be positional. Skull base and vertebrae: There is no acute fracture. Soft tissues and spinal canal: No prevertebral fluid or swelling. No visible canal hematoma. Disc levels: There is no bony spinal canal stenosis. Uncovertebral and facet hypertrophy contribute to multilevel neural foraminal stenosis, which is greatest at C3-4, C4-5 and C5-6 on the right. Upper chest: No pneumothorax, pulmonary nodule or pleural effusion. Other: Normal visualized paraspinal cervical soft tissues. IMPRESSION: 1. No acute intracranial abnormality. Normal appearance of the brain for age. 2. Limited examination of the cervical spine due to nonstandard patient positioning. Within that limitation, no acute cervical spine fracture is identified. 3. Rotatory subluxation at C1-C2 may be positional. Electronically Signed   By: Ulyses Jarred M.D.   On: 05/20/2017 03:54   Ct Cervical Spine Wo Contrast  Result Date: 05/20/2017 CLINICAL DATA:  Fall with shoulder pain EXAM: CT HEAD WITHOUT CONTRAST CT CERVICAL SPINE WITHOUT CONTRAST TECHNIQUE: Multidetector CT imaging of the head and cervical spine was performed following the standard protocol without intravenous contrast. Multiplanar CT image reconstructions of the cervical spine were also generated. COMPARISON:  Head CT 04/13/2017 FINDINGS: The examination is limited by a difficulties with patient positioning. CT HEAD FINDINGS Brain: No mass lesion, intraparenchymal hemorrhage or  extra-axial  collection. No evidence of acute cortical infarct. Brain parenchyma and CSF-containing spaces are normal for age. Vascular: No hyperdense vessel or unexpected calcification. Skull: Normal visualized skull base, calvarium and extracranial soft tissues. Sinuses/Orbits: No sinus fluid levels or advanced mucosal thickening. No mastoid effusion. Normal orbits. CT CERVICAL SPINE FINDINGS Alignment: Cervical lordosis is exaggerated. There is grade 1 anterolisthesis at C2-C3 and grade 1 retrolisthesis at C3-C4. Alignment is otherwise normal. The facets are aligned. There is rotation of C1 on C2, with slight anterior subluxation of C1 relative to C2 on the right and slight posterior subluxation on the left. This may be positional. Skull base and vertebrae: There is no acute fracture. Soft tissues and spinal canal: No prevertebral fluid or swelling. No visible canal hematoma. Disc levels: There is no bony spinal canal stenosis. Uncovertebral and facet hypertrophy contribute to multilevel neural foraminal stenosis, which is greatest at C3-4, C4-5 and C5-6 on the right. Upper chest: No pneumothorax, pulmonary nodule or pleural effusion. Other: Normal visualized paraspinal cervical soft tissues. IMPRESSION: 1. No acute intracranial abnormality. Normal appearance of the brain for age. 2. Limited examination of the cervical spine due to nonstandard patient positioning. Within that limitation, no acute cervical spine fracture is identified. 3. Rotatory subluxation at C1-C2 may be positional. Electronically Signed   By: Ulyses Jarred M.D.   On: 05/20/2017 03:54   Dg Chest Port 1 View  Result Date: 05/15/2017 CLINICAL DATA:  Low O2 sats EXAM: PORTABLE CHEST 1 VIEW COMPARISON:  05/12/2017, 04/07/2017, 01/02/2017 FINDINGS: The study is limited by positioning and kyphosis. Worsening opacity in the right greater than left thorax. No large effusion. Stable cardiomediastinal silhouette. Re- demonstrated large hiatal hernia.  IMPRESSION: Worsening opacity within the right greater than left thorax, may reflect asymmetric edema or diffuse pneumonia. Limited study secondary to kyphosis and habitus. Electronically Signed   By: Donavan Foil M.D.   On: 05/23/2017 23:00   Dg Humerus Right  Result Date: 05/20/2017 CLINICAL DATA:  Status post fall, with right shoulder pain. Question of scapular injury on shoulder radiograph. Further evaluation requested. Initial encounter. EXAM: RIGHT HUMERUS - 2+ VIEW COMPARISON:  Right shoulder radiographs performed earlier today FINDINGS: There appears to be deformity of the inferior right scapular margin, which could reflect an acute fracture. The right humerus appears intact. The right elbow joint is grossly unremarkable in appearance. No elbow joint effusion is identified. No definite soft tissue abnormalities are characterized on radiograph. Atelectasis is noted at the right lung base. IMPRESSION: 1. Apparent deformity of the inferior right scapular margin, which could reflect acute fracture. Would correlate for associated symptoms. 2. Right humerus appears intact. Electronically Signed   By: Garald Balding M.D.   On: 05/20/2017 04:35    Microbiology: Recent Results (from the past 240 hour(s))  Culture, blood (routine x 2)     Status: None (Preliminary result)   Collection Time: 06-16-17  8:47 AM  Result Value Ref Range Status   Specimen Description   Final    RIGHT ANTECUBITAL BOTTLES DRAWN AEROBIC AND ANAEROBIC   Special Requests   Final    Blood Culture results may not be optimal due to an inadequate volume of blood received in culture bottles   Culture NO GROWTH < 12 HOURS  Final   Report Status PENDING  Incomplete  Culture, blood (routine x 2)     Status: None (Preliminary result)   Collection Time: 06-16-2017  8:54 AM  Result Value Ref Range Status  Specimen Description   Final    BLOOD RIGHT FOREARM BOTTLES DRAWN AEROBIC AND ANAEROBIC   Special Requests Blood Culture adequate  volume  Final   Culture NO GROWTH < 12 HOURS  Final   Report Status PENDING  Incomplete     Labs: Basic Metabolic Panel: Recent Labs  Lab 05/05/2017 1839 Jun 11, 2017 0527 2017/06/11 0822  NA 145 141 142  K 4.1 4.2 4.1  CL 103 101 101  CO2 31 29 32  GLUCOSE 68 81 78  BUN 33* 33* 34*  CREATININE 0.91 0.99 0.94  CALCIUM 9.2 9.1 9.1   Liver Function Tests: No results for input(s): AST, ALT, ALKPHOS, BILITOT, PROT, ALBUMIN in the last 168 hours. No results for input(s): LIPASE, AMYLASE in the last 168 hours. Recent Labs  Lab 05/06/2017 2058  AMMONIA 26   CBC: Recent Labs  Lab 05/06/2017 1839 06-11-17 0527  WBC 7.3 6.4  NEUTROABS 6.0 5.3  HGB 11.0* 11.8*  HCT 37.4 42.2  MCV 97.9 102.9*  PLT 107* 125*   Cardiac Enzymes: No results for input(s): CKTOTAL, CKMB, CKMBINDEX, TROPONINI in the last 168 hours. D-Dimer No results for input(s): DDIMER in the last 72 hours. BNP: Invalid input(s): POCBNP CBG: No results for input(s): GLUCAP in the last 168 hours. Anemia work up No results for input(s): VITAMINB12, FOLATE, FERRITIN, TIBC, IRON, RETICCTPCT in the last 72 hours. Urinalysis    Component Value Date/Time   COLORURINE AMBER (A) 06-11-2017 0050   APPEARANCEUR CLEAR 06-11-2017 0050   LABSPEC 1.024 Jun 11, 2017 0050   PHURINE 5.0 06-11-2017 0050   GLUCOSEU NEGATIVE 06/11/17 0050   HGBUR NEGATIVE June 11, 2017 0050   BILIRUBINUR NEGATIVE 2017/06/11 0050   KETONESUR 5 (A) Jun 11, 2017 0050   PROTEINUR NEGATIVE June 11, 2017 0050   UROBILINOGEN 0.2 12/26/2012 1100   NITRITE NEGATIVE 06/11/2017 0050   LEUKOCYTESUR NEGATIVE June 11, 2017 0050   Sepsis Labs Invalid input(s): PROCALCITONIN,  WBC,  LACTICIDVEN     SIGNED:  Loretha Stapler, MD  [facility] 06/11/17, 4:24 PM Pager [phone]  If 7PM-7AM, please contact night-coverage [email or web address] Password [email or web address]

## 2017-05-27 NOTE — Progress Notes (Signed)
Goals of care discussed with patient's sister.  Patient's sister discussed with her brother who is also listed as an emergency contact.  Siblings agree that patient should be made comfort based care only given her significant decline in the past 6 months and her lack of response to therapies started in the emergency department yesterday.  Will remove BiPAP mask in place patient on comfort based measures only.  Patient sister voices understanding that likely patient will expire within the next day.  Patient is not stable to move to hospice house; hospital death anticipated.

## 2017-05-27 NOTE — Progress Notes (Signed)
[facility] her to pick up patient.  Death certificate and belongings given to funeral home.

## 2017-05-27 NOTE — Progress Notes (Signed)
RN in room with patient.  Patient observed with no respirations, mottling, absence of apical pulse verified by RN and [employer].  Patient expired at 1535.  Dr. Adair Patter notified.  [facility] notified but not a candidate, #[phone] representative Katy Apo.  Patient's sister (next of kin) called and notified.

## 2017-05-27 NOTE — Progress Notes (Signed)
[place] with patient's sister about current status of patient.  Patient remains unresponsive and on continuous BiPAP.  Hands turning mottled and cold bilaterally.  Legs still showing mottled with lack of perfusion bilaterally.  Discussed possibly transitioning to comfort based measures.  Patient's sister would like to talk with her brother and will call me back.

## 2017-05-27 NOTE — ED Notes (Signed)
Pt having some agonal breathing; Dr. Myna Hidalgo informed and in to see patient

## 2017-05-27 NOTE — Consult Note (Addendum)
[facility] Nurse wound consult note Reason for Consult: Consult requested for several wounds.   Wound type: Right buttock with stage 2 pressure injury; 1X1X.1cm, pink and dry, irregular shaped, small amt tan drainage, no odor Breat folds with red macerated areas and partial thickness skin loss; appearance consistent with intertrigo Right calf with large bulla; 14X14; yellow fluid filled blister ruptures when touched, draining large amt yellow fluid, skin left intact over the site with a pinhole opening. Right 3rd toe with full thickness wound; .2X.2X.2cm, pale white woundbed, no odor, mod amt tan drainage Right upper leg with full thickness abrasion; 5X1X.3cm, dry red wound bed, small amt tan drainage, no odor, edges not approximated. Bilat plantar feet and heels are dark purple and mottled; recommend vascular consult if aggressive plan of care is desired. Left heel affected area is 7X7cm, 6X6cm to plantar anterior foot. Right heel 6X6cm, plantar anterior foot 10X6cm Pressure Injury POA: Yes Dressing procedure/placement/frequency: Float heels to decrease pressure.  Antifungal powder to promote healing to bilat breast skin folds, foam dressing to prevent rubbing together.  Xeroform gauze to right toe, leg, and inner calf, with ABD pads and kerlex to absorb drainage.  Foam dressing to buttock wound.  No family present to discuss plan of care and pt is not alert. Please re-consult if further assistance is needed.  Thank-you,  Julien Girt MSN, [place], [place], [place], [place]

## 2017-05-27 NOTE — ED Notes (Signed)
Dr Smith Mince notified of pts condition. Noted to have cheyne stokes resp. Bilateral feet mottled. Dr Smith Mince will be down to round on shortly

## 2017-05-27 NOTE — Progress Notes (Signed)
Bipap removed per MD order.  RT notified.

## 2017-05-27 DEATH — deceased

## 2017-05-28 LAB — FOLATE RBC
Folate, RBC: UNDETERMINED ng/mL
Hematocrit: 38.9 % (ref 34.0–46.6)

## 2017-05-29 ENCOUNTER — Ambulatory Visit (HOSPITAL_COMMUNITY): Payer: Medicare Other

## 2017-05-29 LAB — CULTURE, BLOOD (ROUTINE X 2)
CULTURE: NO GROWTH
Culture: NO GROWTH
Special Requests: ADEQUATE

## 2017-06-14 ENCOUNTER — Other Ambulatory Visit: Payer: Self-pay | Admitting: Nurse Practitioner

## 2017-06-24 ENCOUNTER — Ambulatory Visit (HOSPITAL_COMMUNITY): Payer: Medicare Other

## 2017-06-24 ENCOUNTER — Other Ambulatory Visit (HOSPITAL_COMMUNITY): Payer: Medicare Other

## 2017-06-24 ENCOUNTER — Ambulatory Visit (HOSPITAL_COMMUNITY): Payer: Medicare Other | Admitting: Adult Health

## 2017-06-27 ENCOUNTER — Other Ambulatory Visit (HOSPITAL_COMMUNITY): Payer: Medicare Other

## 2017-06-27 ENCOUNTER — Ambulatory Visit (HOSPITAL_COMMUNITY): Payer: Medicare Other

## 2017-06-27 ENCOUNTER — Ambulatory Visit (HOSPITAL_COMMUNITY): Payer: Medicare Other | Admitting: Internal Medicine

## 2019-10-15 IMAGING — CT CT HEAD W/O CM
3 series · 15 of 47 positions shown, 18 images · non-contrast
Comparison: CT of the head performed 01/02/2017

CLINICAL DATA: Acute onset of hallucinations. Altered level of
consciousness.

EXAM:
CT HEAD WITHOUT CONTRAST
TECHNIQUE: Contiguous axial images were obtained from the base of the skull
through the vertex without intravenous contrast.

[Series 2: head wo · axial · 0.41mm/px · z∈[+1369,+1514]mm · 9 of 35 slices shown, 12 images]
[im 3/35  brain]
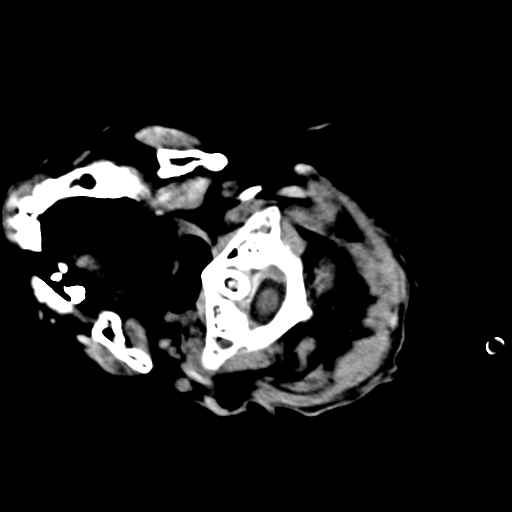
[im 3/35  bone]
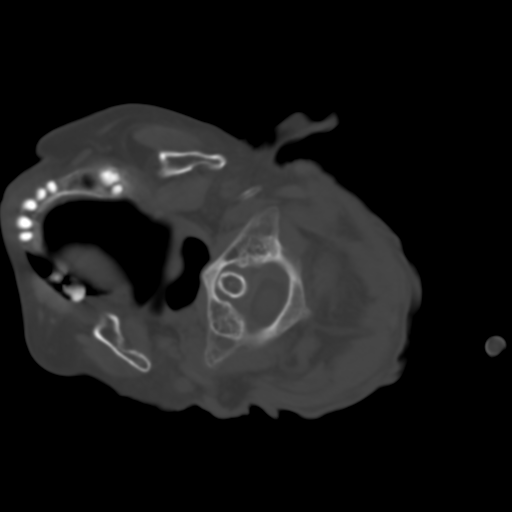
[im 6/35  brain]
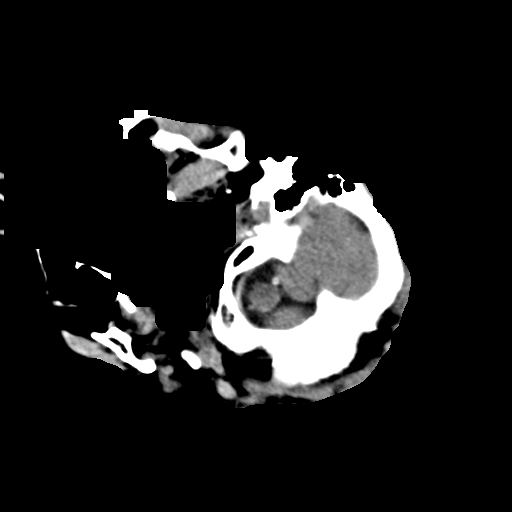
[im 10/35  brain]
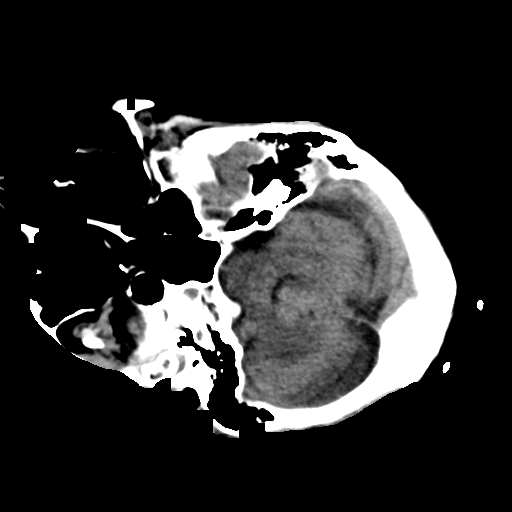
[im 13/35  brain]
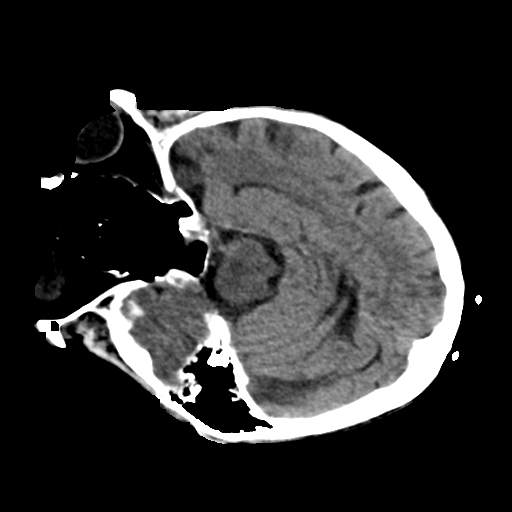
[im 18/35  brain]
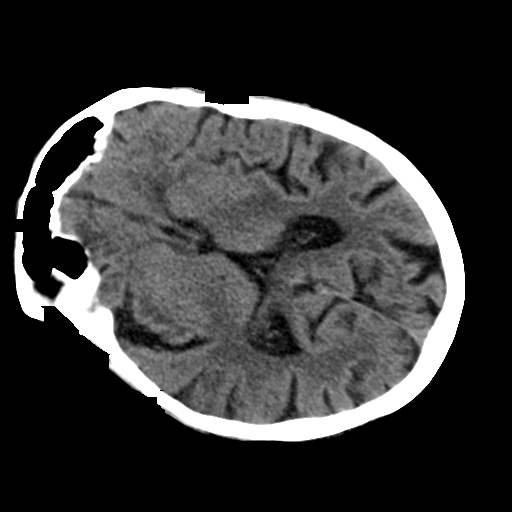
[im 18/35  bone]
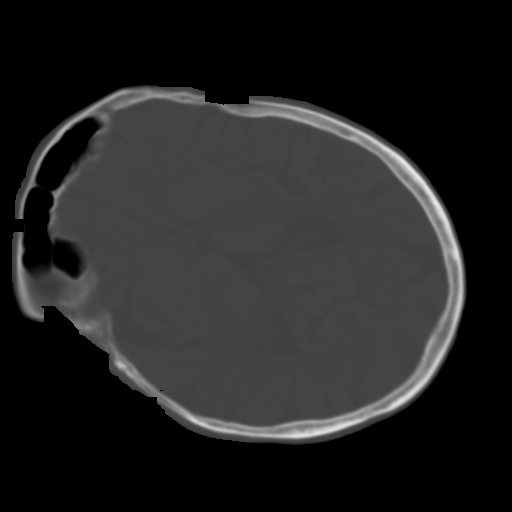
[im 22/35  brain]
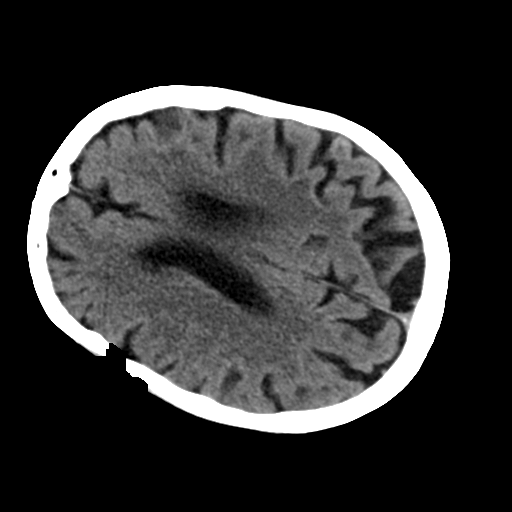
[im 25/35  brain]
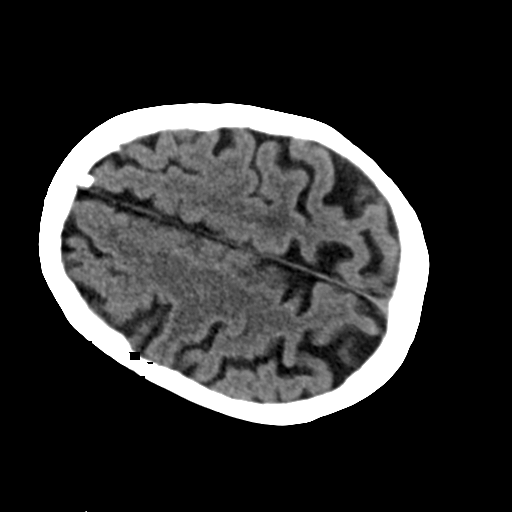
[im 29/35  brain]
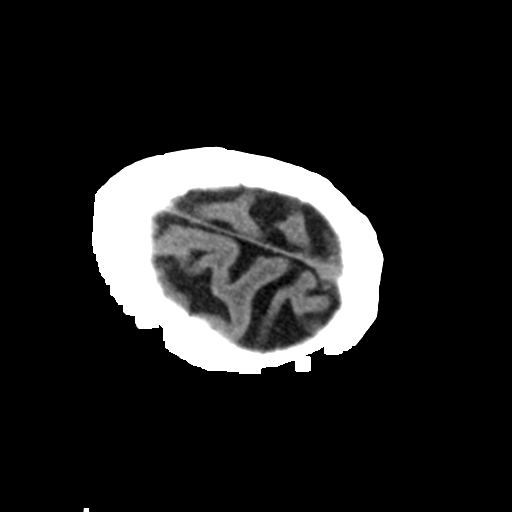
[im 32/35  brain]
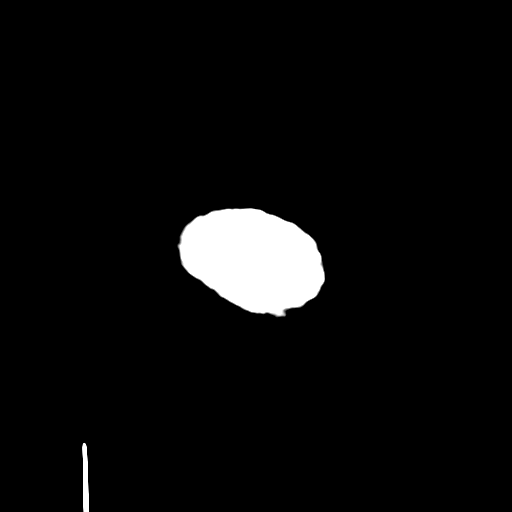
[im 32/35  bone]
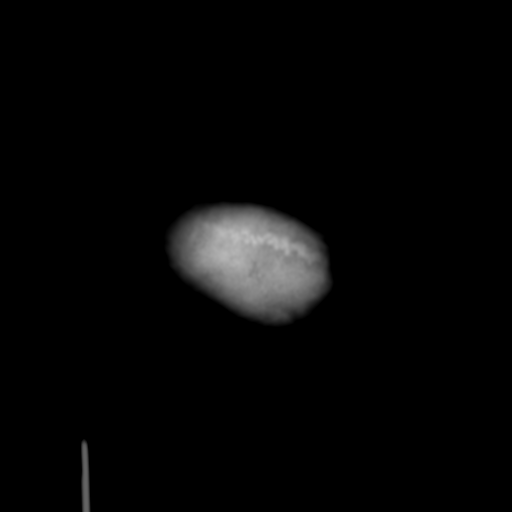

[Series 4: coronal soft tissue · coronal · 0.33mm/px · 3 of 66 slices shown]
[im 22/66  brain]
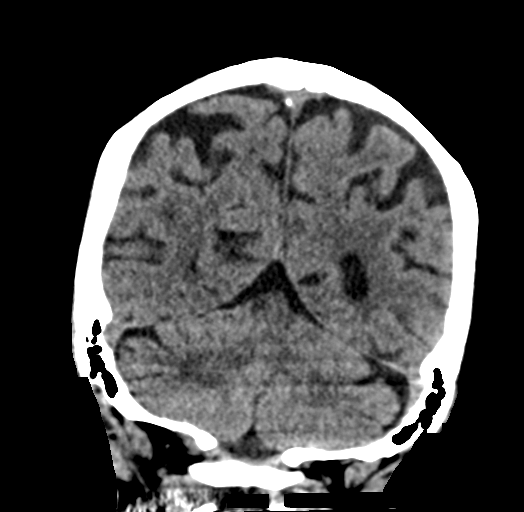
[im 29/66  brain]
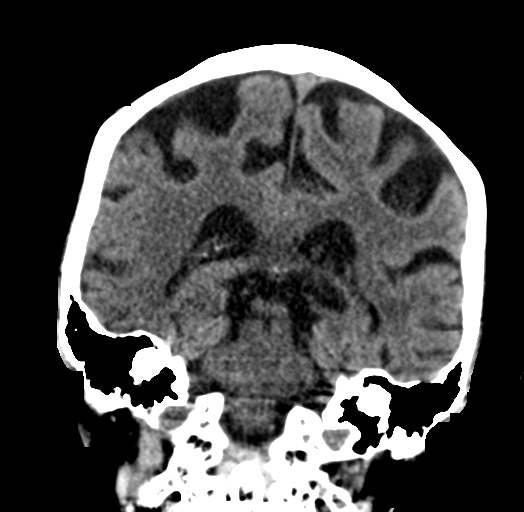
[im 37/66  brain]
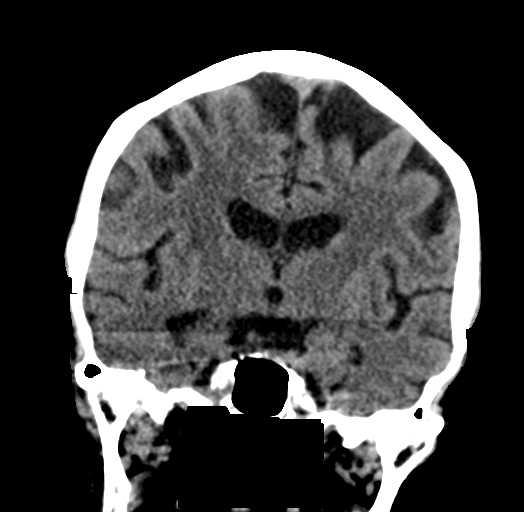

[Series 5: sagittal soft tissue · sagittal · 0.34mm/px · 3 of 55 slices shown]
[im 21/55  brain]
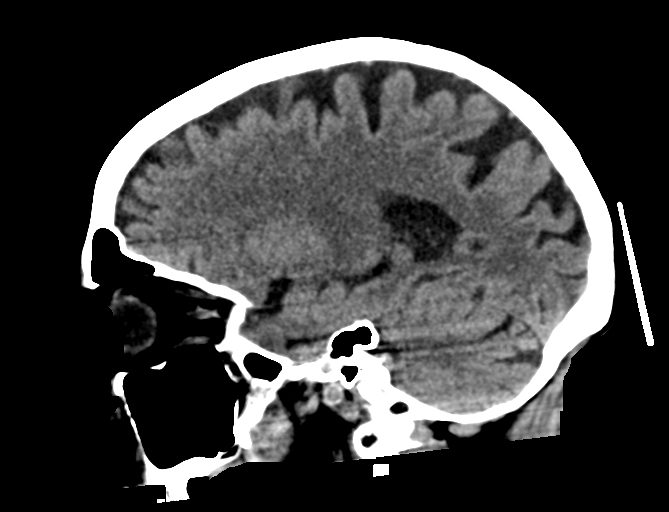
[im 28/55  brain]
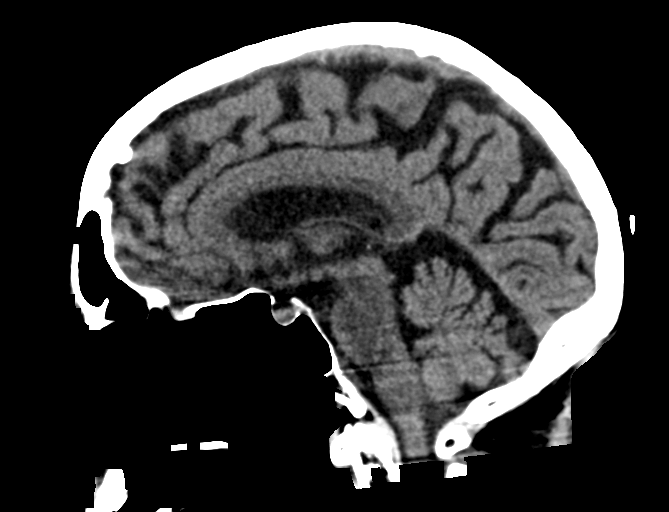
[im 34/55  brain]
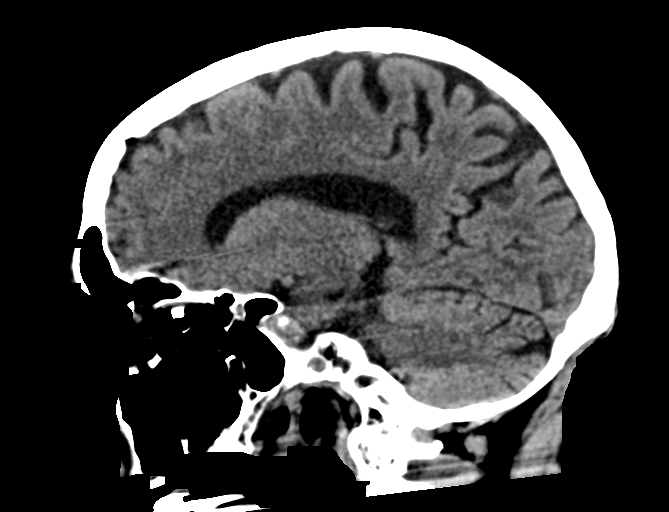

[15 of 47 positions shown; findings below may reference images not displayed]

FINDINGS: Brain: No evidence of acute infarction, hemorrhage, hydrocephalus,
extra-axial collection or mass lesion/mass effect.

Prominence of the ventricles and sulci reflects mild to moderate
cortical volume loss. Mild cerebellar atrophy is noted. Scattered
periventricular and subcortical white matter change likely reflects
small vessel ischemic microangiopathy.

The brainstem and fourth ventricle are within normal limits. The
basal ganglia are unremarkable in appearance. The cerebral
hemispheres demonstrate grossly normal gray-white differentiation.
No mass effect or midline shift is seen.

Vascular: No hyperdense vessel or unexpected calcification.

Skull: There is no evidence of fracture; visualized osseous
structures are unremarkable in appearance.

Sinuses/Orbits: The orbits are within normal limits. The paranasal
sinuses and mastoid air cells are well-aerated.

Other: No significant soft tissue abnormalities are seen.
IMPRESSION: 1. No acute intracranial pathology seen on CT.
2. Mild to moderate cortical volume loss and scattered small vessel
ischemic microangiopathy.

## 2019-10-15 IMAGING — DX DG CHEST 2V
3 series · 3 of 3 positions shown · non-contrast
Comparison: Chest radiograph performed 01/02/2017

CLINICAL DATA: Acute onset of hallucinations.

EXAM:
CHEST  2 VIEW

[chest lat (1 of 2)]
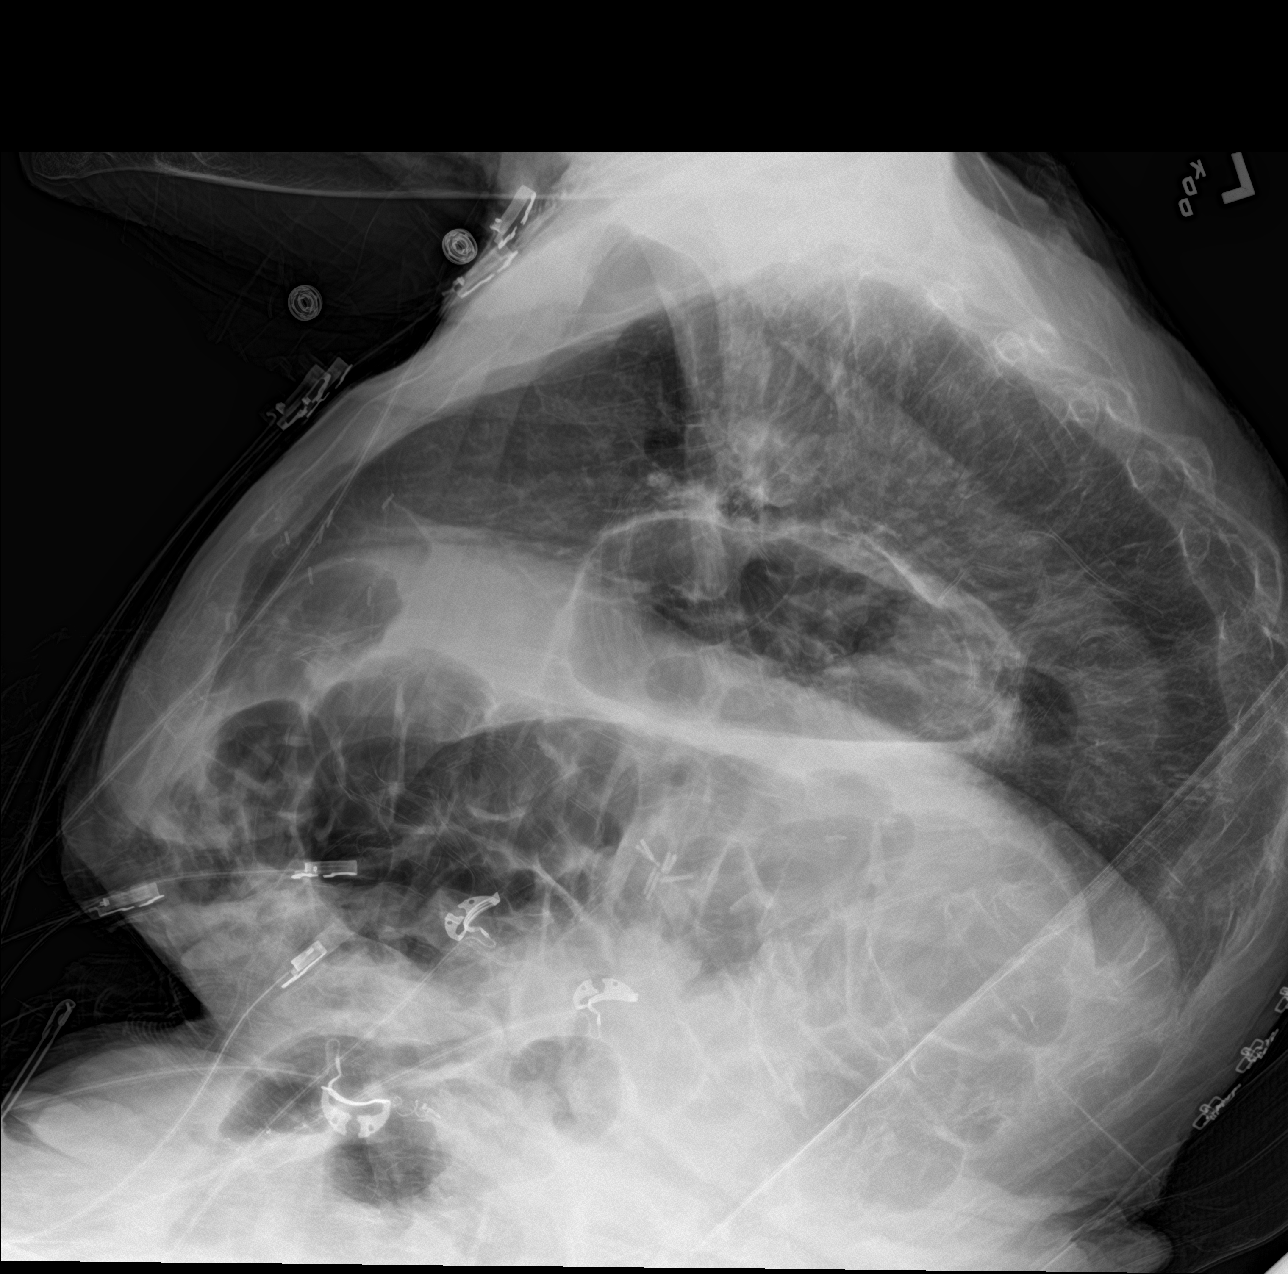

[chest ap]
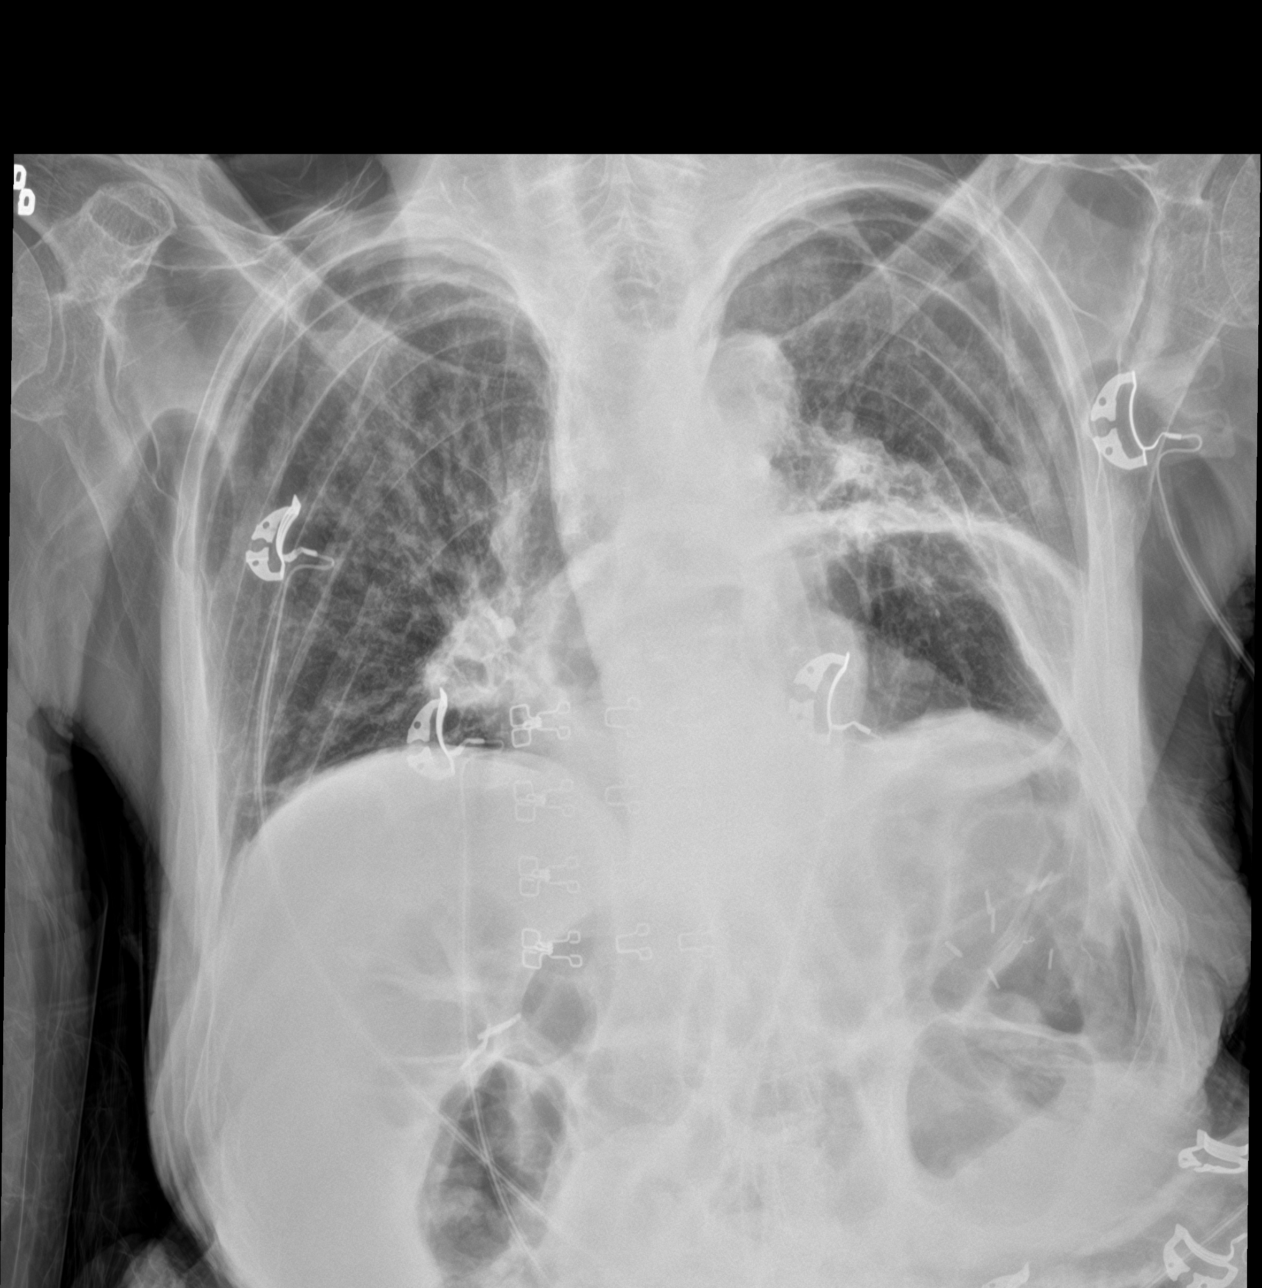

[chest lat (2 of 2)]
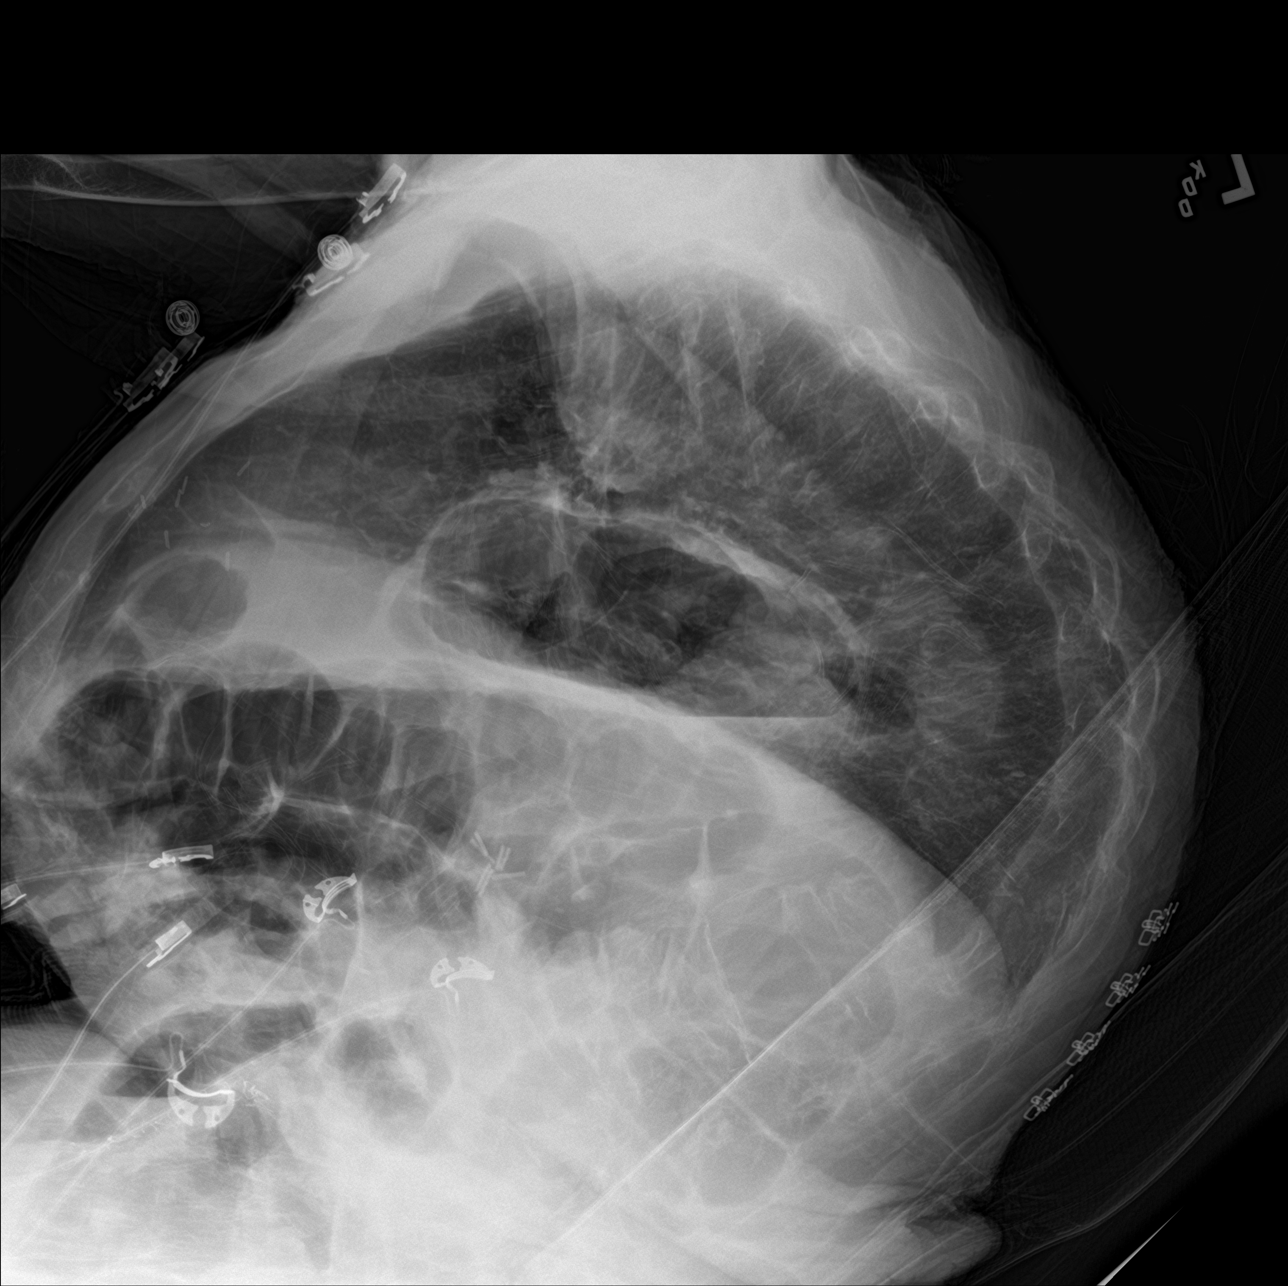

[3 of 3 positions shown; findings below may reference images not displayed]

FINDINGS: The lungs are mildly hypoexpanded. There is elevation of the left
hemidiaphragm, with a large hiatal hernia.

Scattered atelectasis is noted at the lung bases. Mild vascular
congestion is noted. There is no evidence of pleural effusion or
pneumothorax.

The heart is normal in size; the mediastinal contour is within
normal limits. No acute osseous abnormalities are seen. Clips are
noted within the right upper quadrant, reflecting prior
cholecystectomy.
IMPRESSION: 1. Lungs mildly hypoexpanded, with scattered atelectasis. Elevation
of the left hemidiaphragm. Mild vascular congestion noted.
2. Large hiatal hernia noted.
